# Patient Record
Sex: Female | Born: 1940 | Race: White | Hispanic: No | Smoking: Former smoker
Health system: Southern US, Community
[De-identification: ages and names within clinical notes are randomized; demographics above are authoritative.]

## PROBLEM LIST (undated history)

## (undated) DIAGNOSIS — R7303 Prediabetes: Secondary | ICD-10-CM

## (undated) DIAGNOSIS — J302 Other seasonal allergic rhinitis: Secondary | ICD-10-CM

## (undated) DIAGNOSIS — N189 Chronic kidney disease, unspecified: Secondary | ICD-10-CM

## (undated) DIAGNOSIS — K219 Gastro-esophageal reflux disease without esophagitis: Secondary | ICD-10-CM

## (undated) DIAGNOSIS — E785 Hyperlipidemia, unspecified: Secondary | ICD-10-CM

## (undated) DIAGNOSIS — E559 Vitamin D deficiency, unspecified: Secondary | ICD-10-CM

## (undated) DIAGNOSIS — C801 Malignant (primary) neoplasm, unspecified: Secondary | ICD-10-CM

## (undated) DIAGNOSIS — J45909 Unspecified asthma, uncomplicated: Secondary | ICD-10-CM

## (undated) DIAGNOSIS — M1990 Unspecified osteoarthritis, unspecified site: Secondary | ICD-10-CM

## (undated) DIAGNOSIS — I10 Essential (primary) hypertension: Secondary | ICD-10-CM

## (undated) DIAGNOSIS — H269 Unspecified cataract: Secondary | ICD-10-CM

## (undated) DIAGNOSIS — T7840XA Allergy, unspecified, initial encounter: Secondary | ICD-10-CM

## (undated) DIAGNOSIS — Z87440 Personal history of urinary (tract) infections: Secondary | ICD-10-CM

## (undated) HISTORY — PX: TONSILLECTOMY: SUR1361

## (undated) HISTORY — PX: DILATION AND CURETTAGE OF UTERUS: SHX78

## (undated) HISTORY — DX: Prediabetes: R73.03

## (undated) HISTORY — DX: Allergy, unspecified, initial encounter: T78.40XA

## (undated) HISTORY — DX: Hyperlipidemia, unspecified: E78.5

## (undated) HISTORY — DX: Vitamin D deficiency, unspecified: E55.9

## (undated) HISTORY — DX: Unspecified cataract: H26.9

## (undated) HISTORY — DX: Gastro-esophageal reflux disease without esophagitis: K21.9

---

## 1997-03-28 HISTORY — PX: BACK SURGERY: SHX140

## 1997-05-22 ENCOUNTER — Ambulatory Visit (HOSPITAL_COMMUNITY): Admission: RE | Admit: 1997-05-22 | Discharge: 1997-05-22 | Payer: Self-pay | Admitting: *Deleted

## 1997-10-21 ENCOUNTER — Other Ambulatory Visit: Admission: RE | Admit: 1997-10-21 | Discharge: 1997-10-21 | Payer: Self-pay | Admitting: *Deleted

## 1998-07-08 ENCOUNTER — Ambulatory Visit (HOSPITAL_COMMUNITY): Admission: RE | Admit: 1998-07-08 | Discharge: 1998-07-08 | Payer: Self-pay | Admitting: Internal Medicine

## 1998-09-28 ENCOUNTER — Other Ambulatory Visit: Admission: RE | Admit: 1998-09-28 | Discharge: 1998-09-28 | Payer: Self-pay | Admitting: *Deleted

## 1999-03-12 ENCOUNTER — Encounter (INDEPENDENT_AMBULATORY_CARE_PROVIDER_SITE_OTHER): Payer: Self-pay | Admitting: Specialist

## 1999-03-12 ENCOUNTER — Other Ambulatory Visit: Admission: RE | Admit: 1999-03-12 | Discharge: 1999-03-12 | Payer: Self-pay | Admitting: *Deleted

## 1999-07-27 ENCOUNTER — Ambulatory Visit (HOSPITAL_COMMUNITY): Admission: RE | Admit: 1999-07-27 | Discharge: 1999-07-27 | Payer: Self-pay | Admitting: Internal Medicine

## 1999-07-27 ENCOUNTER — Encounter: Payer: Self-pay | Admitting: Internal Medicine

## 1999-11-11 ENCOUNTER — Other Ambulatory Visit: Admission: RE | Admit: 1999-11-11 | Discharge: 1999-11-11 | Payer: Self-pay | Admitting: *Deleted

## 2000-09-05 ENCOUNTER — Encounter: Payer: Self-pay | Admitting: Internal Medicine

## 2000-09-05 ENCOUNTER — Ambulatory Visit (HOSPITAL_COMMUNITY): Admission: RE | Admit: 2000-09-05 | Discharge: 2000-09-05 | Payer: Self-pay | Admitting: Internal Medicine

## 2000-11-28 ENCOUNTER — Other Ambulatory Visit: Admission: RE | Admit: 2000-11-28 | Discharge: 2000-11-28 | Payer: Self-pay | Admitting: *Deleted

## 2001-09-24 ENCOUNTER — Encounter: Payer: Self-pay | Admitting: *Deleted

## 2001-09-24 ENCOUNTER — Ambulatory Visit (HOSPITAL_COMMUNITY): Admission: RE | Admit: 2001-09-24 | Discharge: 2001-09-24 | Payer: Self-pay | Admitting: *Deleted

## 2002-02-07 ENCOUNTER — Other Ambulatory Visit: Admission: RE | Admit: 2002-02-07 | Discharge: 2002-02-07 | Payer: Self-pay | Admitting: *Deleted

## 2002-03-04 ENCOUNTER — Encounter: Admission: RE | Admit: 2002-03-04 | Discharge: 2002-03-04 | Payer: Self-pay | Admitting: *Deleted

## 2002-03-04 ENCOUNTER — Encounter: Payer: Self-pay | Admitting: *Deleted

## 2002-03-07 ENCOUNTER — Other Ambulatory Visit: Admission: RE | Admit: 2002-03-07 | Discharge: 2002-03-07 | Payer: Self-pay | Admitting: *Deleted

## 2002-10-02 ENCOUNTER — Other Ambulatory Visit: Admission: RE | Admit: 2002-10-02 | Discharge: 2002-10-02 | Payer: Self-pay | Admitting: *Deleted

## 2003-02-11 ENCOUNTER — Encounter: Admission: RE | Admit: 2003-02-11 | Discharge: 2003-02-11 | Payer: Self-pay | Admitting: *Deleted

## 2003-04-09 ENCOUNTER — Other Ambulatory Visit: Admission: RE | Admit: 2003-04-09 | Discharge: 2003-04-09 | Payer: Self-pay | Admitting: *Deleted

## 2003-05-21 ENCOUNTER — Ambulatory Visit (HOSPITAL_BASED_OUTPATIENT_CLINIC_OR_DEPARTMENT_OTHER): Admission: RE | Admit: 2003-05-21 | Discharge: 2003-05-21 | Payer: Self-pay | Admitting: Orthopedic Surgery

## 2003-05-21 ENCOUNTER — Ambulatory Visit (HOSPITAL_COMMUNITY): Admission: RE | Admit: 2003-05-21 | Discharge: 2003-05-21 | Payer: Self-pay | Admitting: Orthopedic Surgery

## 2004-04-05 ENCOUNTER — Other Ambulatory Visit: Admission: RE | Admit: 2004-04-05 | Discharge: 2004-04-05 | Payer: Self-pay | Admitting: *Deleted

## 2004-04-08 ENCOUNTER — Encounter: Admission: RE | Admit: 2004-04-08 | Discharge: 2004-04-08 | Payer: Self-pay | Admitting: *Deleted

## 2004-05-18 ENCOUNTER — Ambulatory Visit: Payer: Self-pay | Admitting: Gastroenterology

## 2005-05-26 ENCOUNTER — Encounter: Admission: RE | Admit: 2005-05-26 | Discharge: 2005-05-26 | Payer: Self-pay | Admitting: *Deleted

## 2005-07-06 ENCOUNTER — Other Ambulatory Visit: Admission: RE | Admit: 2005-07-06 | Discharge: 2005-07-06 | Payer: Self-pay | Admitting: *Deleted

## 2006-06-30 ENCOUNTER — Encounter: Admission: RE | Admit: 2006-06-30 | Discharge: 2006-06-30 | Payer: Self-pay | Admitting: Internal Medicine

## 2006-07-27 ENCOUNTER — Other Ambulatory Visit: Admission: RE | Admit: 2006-07-27 | Discharge: 2006-07-27 | Payer: Self-pay | Admitting: *Deleted

## 2006-08-23 ENCOUNTER — Ambulatory Visit: Payer: Self-pay | Admitting: Gastroenterology

## 2006-09-04 ENCOUNTER — Ambulatory Visit: Payer: Self-pay | Admitting: Gastroenterology

## 2006-09-04 ENCOUNTER — Encounter: Payer: Self-pay | Admitting: Gastroenterology

## 2006-09-08 ENCOUNTER — Ambulatory Visit: Payer: Self-pay | Admitting: Cardiovascular Disease

## 2006-09-11 ENCOUNTER — Ambulatory Visit: Payer: Self-pay

## 2006-10-06 ENCOUNTER — Encounter (INDEPENDENT_AMBULATORY_CARE_PROVIDER_SITE_OTHER): Payer: Self-pay | Admitting: *Deleted

## 2007-08-29 ENCOUNTER — Encounter: Admission: RE | Admit: 2007-08-29 | Discharge: 2007-08-29 | Payer: Self-pay | Admitting: Internal Medicine

## 2007-12-07 ENCOUNTER — Encounter: Payer: Self-pay | Admitting: Pulmonary Disease

## 2008-01-02 ENCOUNTER — Ambulatory Visit: Payer: Self-pay | Admitting: Pulmonary Disease

## 2008-01-02 DIAGNOSIS — E785 Hyperlipidemia, unspecified: Secondary | ICD-10-CM

## 2008-01-02 DIAGNOSIS — E782 Mixed hyperlipidemia: Secondary | ICD-10-CM | POA: Insufficient documentation

## 2008-01-02 DIAGNOSIS — I10 Essential (primary) hypertension: Secondary | ICD-10-CM

## 2008-01-02 DIAGNOSIS — R9389 Abnormal findings on diagnostic imaging of other specified body structures: Secondary | ICD-10-CM | POA: Insufficient documentation

## 2008-03-28 HISTORY — PX: KNEE ARTHROPLASTY: SHX992

## 2008-06-23 ENCOUNTER — Ambulatory Visit: Payer: Self-pay | Admitting: Pulmonary Disease

## 2008-06-23 LAB — CONVERTED CEMR LAB
Chloride: 102 meq/L (ref 96–112)
Creatinine, Ser: 0.9 mg/dL (ref 0.4–1.2)
Potassium: 3.3 meq/L — ABNORMAL LOW (ref 3.5–5.1)
Sodium: 138 meq/L (ref 135–145)

## 2008-06-26 ENCOUNTER — Ambulatory Visit: Payer: Self-pay | Admitting: Cardiovascular Disease

## 2008-07-23 ENCOUNTER — Ambulatory Visit: Payer: Self-pay | Admitting: Pulmonary Disease

## 2008-11-03 ENCOUNTER — Encounter: Admission: RE | Admit: 2008-11-03 | Discharge: 2008-11-03 | Payer: Self-pay | Admitting: Internal Medicine

## 2009-01-26 ENCOUNTER — Inpatient Hospital Stay (HOSPITAL_COMMUNITY): Admission: RE | Admit: 2009-01-26 | Discharge: 2009-01-30 | Payer: Self-pay | Admitting: Orthopedic Surgery

## 2010-01-07 ENCOUNTER — Encounter: Admission: RE | Admit: 2010-01-07 | Discharge: 2010-01-07 | Payer: Self-pay | Admitting: Internal Medicine

## 2010-03-16 ENCOUNTER — Other Ambulatory Visit
Admission: RE | Admit: 2010-03-16 | Discharge: 2010-03-16 | Payer: Self-pay | Source: Home / Self Care | Admitting: Internal Medicine

## 2010-03-16 LAB — HM PAP SMEAR: HM Pap smear: NORMAL

## 2010-06-30 LAB — CBC
HCT: 23.2 % — ABNORMAL LOW (ref 36.0–46.0)
HCT: 25.5 % — ABNORMAL LOW (ref 36.0–46.0)
HCT: 26.9 % — ABNORMAL LOW (ref 36.0–46.0)
Hemoglobin: 9.4 g/dL — ABNORMAL LOW (ref 12.0–15.0)
MCHC: 35 g/dL (ref 30.0–36.0)
MCHC: 35.2 g/dL (ref 30.0–36.0)
MCV: 90.1 fL (ref 78.0–100.0)
MCV: 90.2 fL (ref 78.0–100.0)
MCV: 90.8 fL (ref 78.0–100.0)
Platelets: 252 10*3/uL (ref 150–400)
RBC: 2.56 MIL/uL — ABNORMAL LOW (ref 3.87–5.11)
RBC: 2.83 MIL/uL — ABNORMAL LOW (ref 3.87–5.11)
RBC: 2.98 MIL/uL — ABNORMAL LOW (ref 3.87–5.11)
RDW: 12.9 % (ref 11.5–15.5)
WBC: 5.8 10*3/uL (ref 4.0–10.5)
WBC: 8 10*3/uL (ref 4.0–10.5)

## 2010-06-30 LAB — BASIC METABOLIC PANEL
BUN: 3 mg/dL — ABNORMAL LOW (ref 6–23)
BUN: 6 mg/dL (ref 6–23)
CO2: 28 mEq/L (ref 19–32)
CO2: 28 mEq/L (ref 19–32)
Calcium: 8.5 mg/dL (ref 8.4–10.5)
Chloride: 100 mEq/L (ref 96–112)
Chloride: 94 mEq/L — ABNORMAL LOW (ref 96–112)
Chloride: 97 mEq/L (ref 96–112)
GFR calc Af Amer: >60 mL/min (ref >60)
GFR calc Af Amer: >60 mL/min (ref >60)
Glucose, Bld: 118 mg/dL — ABNORMAL HIGH (ref 70–99)
Glucose, Bld: 124 mg/dL — ABNORMAL HIGH (ref 70–99)
Potassium: 3.7 mEq/L (ref 3.5–5.1)
Potassium: 3.8 mEq/L (ref 3.5–5.1)
Potassium: 4.1 mEq/L (ref 3.5–5.1)
Potassium: 4.1 mEq/L (ref 3.5–5.1)
Sodium: 128 mEq/L — ABNORMAL LOW (ref 135–145)
Sodium: 134 mEq/L — ABNORMAL LOW (ref 135–145)

## 2010-06-30 LAB — PROTIME-INR
INR: 1.07 (ref 0.00–1.49)
Prothrombin Time: 17.6 seconds — ABNORMAL HIGH (ref 11.6–15.2)

## 2010-07-01 LAB — COMPREHENSIVE METABOLIC PANEL
BUN: 11 mg/dL (ref 6–23)
CO2: 28 mEq/L (ref 19–32)
Calcium: 9.5 mg/dL (ref 8.4–10.5)
Creatinine, Ser: 0.59 mg/dL (ref 0.4–1.2)
GFR calc Af Amer: >60 mL/min (ref >60)
GFR calc non Af Amer: >60 mL/min (ref >60)
Glucose, Bld: 100 mg/dL — ABNORMAL HIGH (ref 70–99)

## 2010-07-01 LAB — URINALYSIS, ROUTINE W REFLEX MICROSCOPIC
Bilirubin Urine: NEGATIVE
Glucose, UA: NEGATIVE mg/dL
Hgb urine dipstick: NEGATIVE
Specific Gravity, Urine: 1.011 (ref 1.005–1.030)
Urobilinogen, UA: 0.2 mg/dL (ref 0.0–1.0)

## 2010-07-01 LAB — TYPE AND SCREEN: ABO/RH(D): O POS

## 2010-07-01 LAB — CBC
HCT: 36.4 % (ref 36.0–46.0)
Hemoglobin: 12.6 g/dL (ref 12.0–15.0)
MCHC: 34.5 g/dL (ref 30.0–36.0)
MCV: 90.4 fL (ref 78.0–100.0)
RBC: 4.03 MIL/uL (ref 3.87–5.11)

## 2010-07-01 LAB — PROTIME-INR: Prothrombin Time: 13.3 seconds (ref 11.6–15.2)

## 2010-07-01 LAB — APTT: aPTT: 26 seconds (ref 24–37)

## 2010-07-09 ENCOUNTER — Other Ambulatory Visit: Payer: Self-pay | Admitting: Dermatology

## 2010-08-10 NOTE — Letter (Signed)
September 08, 2006    Almedia Balls. Fore, M.D.  [phone] [address]  [place], [place] [address]   RE:  ADIA, CRAMMER  MRN:  [id number]  /  DOB:  29-Oct-1940   Dear Dr. Randell Patient,   It was my pleasure to see Barrie Sigmund at the [facility] Office  on September 08, 2006. As you know, she is a delightful, 70 year old woman  who presented here for evaluation of an abnormal ECG. She has been noted  to have endometrial polyps and/or hyperplasia and presented this morning  to your office for a D&C. Her preoperative EKG demonstrated  anterolateral as well as inferior ST segment and T wave changes that  raised concern for ischemia. Subsequently her surgery was cancelled and  she was sent here for evaluation.   Ms. Castronova has no documented cardiac problems. She is asymptomatic and  specifically denies any chest pain, dyspnea, light-headedness, syncope,  palpitations, orthopnea, PND, or edema. She has no history of diabetes.  However, she does have a history of hypertension and dyslipidemia by her  report.   She tells me that she underwent an exercise Cardiolite stress study  greater than 10 years ago and that it was within normal limits.   CURRENT MEDICATIONS:  1. Atenolol 50 mg daily.  2. Hydrochlorothiazide 25 mg daily.  3. Coenzyme Q-10 100 mEq daily.  4. B complex 200 mg daily.  5. Aspirin 81 mg daily.   ALLERGIES:  CODEINE, PERCOCET, and VICODIN.   PAST MEDICAL HISTORY:  Pertinent for the following:  1. Essential hypertension. Good control reported on beta blocker and      thiazide diuretic.  2. Borderline lipids. She tells me that her total cholesterol was 230      but that she has a high HDL. She has taken red yeast rice but did      not tolerate it well.  3. Lumbar back surgery in 1999.  4. Left knee surgery in 1999 and again in 2004.  5. History of D&C in the past, most recently in 2000.   SOCIAL HISTORY:  She is married with 2 children, she lives locally. 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 She has a history of cigarette use but quit smoking in  1997. She does not drink alcohol or caffeinated beverages. She exercises  regularly with water aerobics, walking, and Pilates.   FAMILY HISTORY:  The patient's mother died at age 40 of breast cancer,  her father died at age 31 in an auto accident. There is no history of  coronary artery disease in the family.   REVIEW OF SYSTEMS:  A complete 12-point review of systems was performed.  The only pertinent positive was joint pain from arthritis. All other  systems are negative except as detailed above.   PHYSICAL EXAMINATION:  GENERAL:  The patient is alert and oriented, she  is in no acute distress.  VITAL SIGNS:  Her weight is 142 pounds, blood pressure is 130/80 in the  right arm, 120/80 in the left arm. Heart rate is 56, respiratory rate is  16.  HEENT:  Normal.  NECK:  Normal carotid upstrokes without bruits. Jugular venous pressure  is normal, no thyromegaly or thyroid nodules.  LUNGS:  Clear to auscultation bilaterally .  HEART:  Regular rate and rhythm without murmurs or gallops. The apex is  discreet and nondisplaced. There is no right ventricular  heave or lift.  BACK:  There is no CVA tenderness.  ABDOMEN:  Soft, nontender, no organomegaly, no bruits, no masses.  EXTREMITIES:  Femoral pulses are 2+ without bruits. Pedal pulses are 2+  and equal. There is no clubbing, cyanosis or edema.  SKIN:  Warm and dry and without rash.  NEUROLOGIC:  Cranial nerves II-XII are intact. Strength is 5/5 and equal  in the arms and legs bilaterally.   EKG demonstrates normal sinus rhythm with anterolateral and inferior T  wave inversions. Cannot rule out ischemia. Compared to EKG from June 27, 2006, the anterolateral T wave change is new; however, compared with an  EKG from May 03, 2005, the changes are similar.   ASSESSMENT:  Ms. Curlin is a 70 year old woman with  an abnormal EKG. Her  ST changes certainly could be consistent with ischemic heart disease  although it is reassuring to see that they are unchanged from her  electrocardiogram dating back to 2007. Her lack of symptoms is also  reassuring especially at her level of fitness and activity. I think in  the setting of significant changes on her ECG that we should proceed  with an exercise Myoview stress study to rule out silent ischemia. She  does have some risk factors for coronary artery disease which include  her underlying hypertension and dyslipidemia as well as her history of  tobacco use and her age. Pending the result of her stress study, she  should be a low-risk candidate for her upcoming D&C unless there is a  significant abnormality seen.   Regardless of the result of the stress study, I would favor treating her  with low-dose aspirin after her surgery is over. I would defer her lipid  management to Dr. Oneta Rack who follows her for primary care. Her blood  pressure seems to be under very good control at this point.   Dr. Randell Patient, thanks again for allowing me to see Ms. Heyne. I will be in  contact with your office as soon as her stress study is completed so  that she can move forward with her surgery. Please free to call at any  time with questions.    Sincerely,      Veverly Fells. Excell Seltzer, MD  Electronically Signed    MDC/MedQ  DD: 09/11/2006  DT: 09/11/2006  Job #: [id number]   CC:    Lucky Cowboy, M.D.

## 2010-08-13 NOTE — Op Note (Signed)
NAME:  Michaela Kelley, Michaela Kelley                           ACCOUNT NO.:  [id number]   MEDICAL RECORD NO.:  [id number]                   PATIENT TYPE:  AMB   LOCATION:  [facility]                                  FACILITY:  [facility]   PHYSICIAN:  Loreta Ave, M.D.              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  Reactive synovitis and loose bodies.   PROCEDURE:  Left knee exam under anesthesia, arthroscopy, partial medial and  lateral meniscectomies, chondroplasty of all three compartments with removal  of two large osteochondral loose bodies and numerous smaller chondral loose  bodies, partial synovectomy.   SURGEON:  Loreta Ave, M.D.   ASSISTANT:  Arlys John D. Petrarca, P.A.-C.   ANESTHESIA:  Knee block with general.   SPECIMENS:  None.   CULTURES:  None.   COMPLICATIONS:  None.   DRESSINGS:  Soft compressive.   PROCEDURE:  The patient was brought to the operating room and after adequate  anesthesia had been obtained, the tourniquet and leg holder were applied to  the left knee.  The knee was examined with good motion and stability, slight  varus alignment, stable ligaments, but crepitus in all three compartments.  After the tourniquet and leg holder were applied, the leg was prepped and  draped in the usual sterile fashion.  Three portals were created, one  superolateral and one each medial and lateral parapatellar.  Inflow catheter  introduced, the knee distended, arthroscope introduced, the knee inspected.  Extensive grade 3 changes of the patellofemoral joint debrided.   Some  lateral tracking not excessive.  Focal grade 4 lesion on the trochlea,  medial aspect, debrided, and loose flaps removed.  Two large 7-8 mm  osteochondral loose bodies removed from the front of the notch.  Cruciate  ligaments had some attrition but intact.  Medial compartment exposed, bone  over more than half the compartment, what was left of the medial meniscus  had extensive complex torn fragment throughout.  Saucerized out removing  most of the meniscus except for a little of the posterior third.  Everything  removed was torn and nonfunctional.  The lateral compartment had extensive  grade 3 and some focal grade 4 changes mostly on the plateau.  Frayed flap  tears all the way around the lateral meniscus, saucerized out, tapered  smooth, retaining about half the meniscus all the way around at the  completion.  Hypertrophic synovitis removed.  At completion, all recesses  examined, all loose bodies removed.  Very poor prognosis based on the  degree  of degenerative changes found.  The instruments and fluid were  removed.  The portals of the knee were injected with Marcaine.  The portals  were closed with 4-0 nylon.  Sterile compressive dressing applied.  Anesthesia reversed.  Brought to the recovery room.  Tolerated the surgery  well without complications.                                               Loreta Ave, M.D.    DFM/MEDQ  D:  05/21/2003  T:  05/21/2003  Job:  [id number]

## 2011-02-02 ENCOUNTER — Encounter (HOSPITAL_COMMUNITY): Payer: Self-pay | Admitting: Pharmacy Technician

## 2011-02-03 ENCOUNTER — Encounter (HOSPITAL_COMMUNITY)
Admission: RE | Admit: 2011-02-03 | Discharge: 2011-02-03 | Disposition: A | Discharge status: Home / Self Care | Payer: Medicare Other | Source: Ambulatory Visit | Attending: Orthopedic Surgery | Admitting: Orthopedic Surgery

## 2011-02-03 ENCOUNTER — Ambulatory Visit (HOSPITAL_COMMUNITY)
Admission: RE | Admit: 2011-02-03 | Discharge: 2011-02-03 | Disposition: A | Discharge status: Home / Self Care | Payer: Medicare Other | Source: Ambulatory Visit | Attending: Anesthesiology | Admitting: Anesthesiology

## 2011-02-03 ENCOUNTER — Encounter (HOSPITAL_COMMUNITY): Payer: Self-pay

## 2011-02-03 DIAGNOSIS — Z01812 Encounter for preprocedural laboratory examination: Secondary | ICD-10-CM | POA: Insufficient documentation

## 2011-02-03 DIAGNOSIS — Z01818 Encounter for other preprocedural examination: Secondary | ICD-10-CM | POA: Insufficient documentation

## 2011-02-03 HISTORY — DX: Personal history of urinary (tract) infections: Z87.440

## 2011-02-03 HISTORY — DX: Other seasonal allergic rhinitis: J30.2

## 2011-02-03 HISTORY — DX: Unspecified osteoarthritis, unspecified site: M19.90

## 2011-02-03 HISTORY — DX: Essential (primary) hypertension: I10

## 2011-02-03 LAB — COMPREHENSIVE METABOLIC PANEL
ALT: 34 U/L (ref 0–35)
Alkaline Phosphatase: 71 U/L (ref 39–117)
BUN: 9 mg/dL (ref 6–23)
Chloride: 94 mEq/L — ABNORMAL LOW (ref 96–112)
GFR calc Af Amer: >90 mL/min (ref >90)
Glucose, Bld: 100 mg/dL — ABNORMAL HIGH (ref 70–99)
Potassium: 3.5 mEq/L (ref 3.5–5.1)
Sodium: 130 mEq/L — ABNORMAL LOW (ref 135–145)
Total Bilirubin: 0.5 mg/dL (ref 0.3–1.2)

## 2011-02-03 LAB — CBC
HCT: 37.5 % (ref 36.0–46.0)
Hemoglobin: 12.9 g/dL (ref 12.0–15.0)
MCH: 30.4 pg (ref 26.0–34.0)
MCV: 88.2 fL (ref 78.0–100.0)
Platelets: 406 10*3/uL — ABNORMAL HIGH (ref 150–400)
RBC: 4.25 MIL/uL (ref 3.87–5.11)
WBC: 6.9 10*3/uL (ref 4.0–10.5)

## 2011-02-03 LAB — SURGICAL PCR SCREEN
MRSA, PCR: NEGATIVE
Staphylococcus aureus: NEGATIVE

## 2011-02-03 LAB — URINALYSIS, ROUTINE W REFLEX MICROSCOPIC
Glucose, UA: NEGATIVE mg/dL
Hgb urine dipstick: NEGATIVE
Ketones, ur: NEGATIVE mg/dL
Leukocytes, UA: NEGATIVE
Protein, ur: NEGATIVE mg/dL
pH: 7.5 (ref 5.0–8.0)

## 2011-02-03 LAB — TYPE AND SCREEN: ABO/RH(D): O POS

## 2011-02-03 LAB — PROTIME-INR: Prothrombin Time: 13.8 seconds (ref 11.6–15.2)

## 2011-02-03 NOTE — Pre-Procedure Instructions (Signed)
20 Michaela Kelley  02/03/2011   Your procedure is scheduled on:  November 15   Report to [facility] at 5:30 AM.  Call this number if you have problems the morning of surgery: [phone]   Remember:   Do not eat food:After Midnight.  Do not drink clear liquids: 4 Hours before arrival.  Take these medicines the morning of surgery with A SIP OF WATER: pain pill, atenolol,    Do not wear jewelry, make-up or nail polish.  Do not wear lotions, powders, or perfumes. You may wear deodorant.  Do not shave 48 hours prior to surgery.  Do not bring valuables to the hospital.  Contacts, dentures or bridgework may not be worn into surgery.  Leave suitcase in the car. After surgery it may be brought to your room.  For patients admitted to the hospital, checkout time is 11:00 AM the day of discharge.   Patients discharged the day of surgery will not be allowed to drive home.  Name and phone number of your driver: NA  Special Instructions: Incentive Spirometry - Practice and bring it with you on the day of surgery. and CHG Shower Use Special Wash: 1/2 bottle night before surgery and 1/2 bottle morning of surgery.   Please read over the following fact sheets that you were given: Pain Booklet, Coughing and Deep Breathing, Blood Transfusion Information and Surgical Site Infection Prevention

## 2011-02-03 NOTE — Progress Notes (Signed)
Requested EKG from Dr. Gerlene Fee office.

## 2011-02-09 NOTE — Consult Note (Signed)
Anesthesia:  Patient is a 70 year old for left shoulder arthroplasty.  Hx of HTN.  Labs/CXR/EKG noted.  Plan to proceed.

## 2011-02-10 ENCOUNTER — Encounter (HOSPITAL_COMMUNITY): Payer: Self-pay | Admitting: *Deleted

## 2011-02-10 ENCOUNTER — Encounter (HOSPITAL_COMMUNITY)
Admission: RE | Disposition: A | Discharge status: Home / Self Care | Payer: Self-pay | Source: Ambulatory Visit | Attending: Orthopedic Surgery

## 2011-02-10 ENCOUNTER — Ambulatory Visit (HOSPITAL_COMMUNITY): Payer: Medicare Other | Admitting: Vascular Surgery

## 2011-02-10 ENCOUNTER — Inpatient Hospital Stay (HOSPITAL_COMMUNITY)
Admission: RE | Admit: 2011-02-10 | Discharge: 2011-02-11 | DRG: 484 | Disposition: A | Discharge status: Home / Self Care | Payer: Medicare Other | Source: Ambulatory Visit | Attending: Orthopedic Surgery | Admitting: Orthopedic Surgery

## 2011-02-10 ENCOUNTER — Encounter (HOSPITAL_COMMUNITY): Payer: Self-pay | Admitting: Vascular Surgery

## 2011-02-10 DIAGNOSIS — I10 Essential (primary) hypertension: Secondary | ICD-10-CM | POA: Diagnosis present

## 2011-02-10 DIAGNOSIS — M19019 Primary osteoarthritis, unspecified shoulder: Principal | ICD-10-CM | POA: Diagnosis present

## 2011-02-10 HISTORY — PX: TOTAL SHOULDER ARTHROPLASTY: SHX126

## 2011-02-10 SURGERY — ARTHROPLASTY, SHOULDER, TOTAL
Anesthesia: Regional | Site: Shoulder | Laterality: Left | Wound class: Clean

## 2011-02-10 MED ORDER — PHENYLEPHRINE HCL 10 MG/ML IJ SOLN
INTRAMUSCULAR | Status: DC | PRN
  Administered 2011-02-10 (×3): 80 ug via INTRAVENOUS
  Administered 2011-02-10 (×2): 160 ug via INTRAVENOUS

## 2011-02-10 MED ORDER — ONDANSETRON HCL 4 MG/2ML IJ SOLN: 4.0000 mg | Freq: Once | INTRAMUSCULAR | Status: DC | PRN

## 2011-02-10 MED ORDER — EPHEDRINE SULFATE 50 MG/ML IJ SOLN
INTRAMUSCULAR | Status: DC | PRN
  Administered 2011-02-10 (×2): 10 mg via INTRAVENOUS

## 2011-02-10 MED ORDER — BISACODYL 5 MG PO TBEC: 10.0000 mg | DELAYED_RELEASE_TABLET | Freq: Every day | ORAL | Status: DC | PRN

## 2011-02-10 MED ORDER — HYDROCHLOROTHIAZIDE 25 MG PO TABS
25.0000 mg | ORAL_TABLET | Freq: Every day | ORAL | Status: DC
  Administered 2011-02-10 – 2011-02-11 (×2): 25 mg via ORAL
  Filled 2011-02-10 (×2): qty 1

## 2011-02-10 MED ORDER — POLYETHYLENE GLYCOL 3350 17 G PO PACK
17.0000 g | PACK | Freq: Every day | ORAL | Status: DC | PRN
  Filled 2011-02-10: qty 1

## 2011-02-10 MED ORDER — HYDROMORPHONE HCL PF 1 MG/ML IJ SOLN: 0.2500 mg | INTRAMUSCULAR | Status: DC | PRN

## 2011-02-10 MED ORDER — ACETAMINOPHEN 325 MG PO TABS
650.0000 mg | ORAL_TABLET | Freq: Four times a day (QID) | ORAL | Status: DC | PRN
  Administered 2011-02-10: 650 mg via ORAL
  Filled 2011-02-10: qty 2

## 2011-02-10 MED ORDER — VITAMIN D2 50 MCG (2000 UT) PO TABS: 1.0000 | ORAL_TABLET | Freq: Every day | ORAL | Status: DC

## 2011-02-10 MED ORDER — ATENOLOL 50 MG PO TABS
50.0000 mg | ORAL_TABLET | Freq: Every day | ORAL | Status: DC
  Administered 2011-02-11: 50 mg via ORAL
  Filled 2011-02-10 (×2): qty 1

## 2011-02-10 MED ORDER — LACTATED RINGERS IV SOLN
INTRAVENOUS | Status: DC
  Administered 2011-02-10: 14:00:00 via INTRAVENOUS

## 2011-02-10 MED ORDER — ONDANSETRON HCL 4 MG PO TABS
4.0000 mg | ORAL_TABLET | Freq: Four times a day (QID) | ORAL | Status: DC | PRN
  Administered 2011-02-10: 4 mg via ORAL

## 2011-02-10 MED ORDER — ACETAMINOPHEN 650 MG RE SUPP: 650.0000 mg | Freq: Four times a day (QID) | RECTAL | Status: DC | PRN

## 2011-02-10 MED ORDER — ZOLPIDEM TARTRATE 5 MG PO TABS: 5.0000 mg | ORAL_TABLET | Freq: Every evening | ORAL | Status: DC | PRN

## 2011-02-10 MED ORDER — BUPIVACAINE-EPINEPHRINE PF 0.5-1:200000 % IJ SOLN
INTRAMUSCULAR | Status: DC | PRN
  Administered 2011-02-10: 30 mL

## 2011-02-10 MED ORDER — LACTATED RINGERS IV SOLN
INTRAVENOUS | Status: DC | PRN
  Administered 2011-02-10 (×2): via INTRAVENOUS

## 2011-02-10 MED ORDER — MAGNESIUM HYDROXIDE 400 MG/5ML PO SUSP: 30.0000 mL | Freq: Two times a day (BID) | ORAL | Status: DC | PRN

## 2011-02-10 MED ORDER — ONDANSETRON HCL 4 MG/2ML IJ SOLN
INTRAMUSCULAR | Status: DC | PRN
  Administered 2011-02-10: 4 mg via INTRAVENOUS

## 2011-02-10 MED ORDER — ASPIRIN EC 81 MG PO TBEC
81.0000 mg | DELAYED_RELEASE_TABLET | Freq: Every day | ORAL | Status: DC
  Administered 2011-02-10 – 2011-02-11 (×2): 81 mg via ORAL
  Filled 2011-02-10 (×2): qty 1

## 2011-02-10 MED ORDER — KETOROLAC TROMETHAMINE 15 MG/ML IJ SOLN
15.0000 mg | Freq: Four times a day (QID) | INTRAMUSCULAR | Status: DC
  Administered 2011-02-10 – 2011-02-11 (×4): 15 mg via INTRAVENOUS
  Filled 2011-02-10 (×9): qty 1

## 2011-02-10 MED ORDER — METHOCARBAMOL 100 MG/ML IJ SOLN
500.0000 mg | Freq: Four times a day (QID) | INTRAVENOUS | Status: DC | PRN
  Filled 2011-02-10: qty 5

## 2011-02-10 MED ORDER — ONDANSETRON HCL 4 MG/2ML IJ SOLN: 4.0000 mg | Freq: Four times a day (QID) | INTRAMUSCULAR | Status: DC | PRN

## 2011-02-10 MED ORDER — HYDROMORPHONE HCL PF 1 MG/ML IJ SOLN: 0.5000 mg | INTRAMUSCULAR | Status: DC | PRN

## 2011-02-10 MED ORDER — ROCURONIUM BROMIDE 100 MG/10ML IV SOLN
INTRAVENOUS | Status: DC | PRN
  Administered 2011-02-10 (×3): 80 mg via INTRAVENOUS
  Administered 2011-02-10: 40 mg via INTRAVENOUS
  Administered 2011-02-10: 80 mg via INTRAVENOUS

## 2011-02-10 MED ORDER — BISACODYL 10 MG RE SUPP: 10.0000 mg | Freq: Every day | RECTAL | Status: DC | PRN

## 2011-02-10 MED ORDER — DEXAMETHASONE SODIUM PHOSPHATE 4 MG/ML IJ SOLN
INTRAMUSCULAR | Status: DC | PRN
  Administered 2011-02-10: 4 mg via INTRAVENOUS

## 2011-02-10 MED ORDER — VITAMIN D3 25 MCG (1000 UNIT) PO TABS
1000.0000 [IU] | ORAL_TABLET | Freq: Every day | ORAL | Status: DC
  Administered 2011-02-10 – 2011-02-11 (×2): 1000 [IU] via ORAL
  Filled 2011-02-10 (×4): qty 1

## 2011-02-10 MED ORDER — FENTANYL CITRATE 0.05 MG/ML IJ SOLN
INTRAMUSCULAR | Status: DC | PRN
  Administered 2011-02-10: 100 ug via INTRAVENOUS
  Administered 2011-02-10: 150 ug via INTRAVENOUS

## 2011-02-10 MED ORDER — METOCLOPRAMIDE HCL 5 MG/ML IJ SOLN
5.0000 mg | Freq: Three times a day (TID) | INTRAMUSCULAR | Status: DC | PRN
  Filled 2011-02-10: qty 2

## 2011-02-10 MED ORDER — METOCLOPRAMIDE HCL 5 MG PO TABS
5.0000 mg | ORAL_TABLET | Freq: Three times a day (TID) | ORAL | Status: DC | PRN
  Administered 2011-02-10: 10 mg via ORAL
  Filled 2011-02-10: qty 2

## 2011-02-10 MED ORDER — GLYCOPYRROLATE 0.2 MG/ML IJ SOLN
INTRAMUSCULAR | Status: DC | PRN
  Administered 2011-02-10: .2 mg via INTRAVENOUS
  Administered 2011-02-10: .4 mg via INTRAVENOUS

## 2011-02-10 MED ORDER — DOCUSATE SODIUM 100 MG PO CAPS
100.0000 mg | ORAL_CAPSULE | Freq: Two times a day (BID) | ORAL | Status: DC
  Administered 2011-02-10 – 2011-02-11 (×3): 100 mg via ORAL
  Filled 2011-02-10 (×4): qty 1

## 2011-02-10 MED ORDER — FLEET ENEMA 7-19 GM/118ML RE ENEM: 1.0000 | ENEMA | Freq: Every day | RECTAL | Status: DC | PRN

## 2011-02-10 MED ORDER — MIDAZOLAM HCL 5 MG/5ML IJ SOLN
INTRAMUSCULAR | Status: DC | PRN
  Administered 2011-02-10: 2 mg via INTRAVENOUS

## 2011-02-10 MED ORDER — LORATADINE 10 MG PO TABS
10.0000 mg | ORAL_TABLET | Freq: Every day | ORAL | Status: DC
  Filled 2011-02-10 (×3): qty 1

## 2011-02-10 MED ORDER — CEFAZOLIN SODIUM 1-5 GM-% IV SOLN
1.0000 g | Freq: Four times a day (QID) | INTRAVENOUS | Status: AC
  Administered 2011-02-10 – 2011-02-11 (×3): 1 g via INTRAVENOUS
  Filled 2011-02-10 (×3): qty 50

## 2011-02-10 MED ORDER — PROPOFOL 10 MG/ML IV EMUL
INTRAVENOUS | Status: DC | PRN
  Administered 2011-02-10: 150 mg via INTRAVENOUS

## 2011-02-10 MED ORDER — METHOCARBAMOL 500 MG PO TABS: 500.0000 mg | ORAL_TABLET | Freq: Four times a day (QID) | ORAL | Status: DC | PRN

## 2011-02-10 MED ORDER — CEFAZOLIN SODIUM 1-5 GM-% IV SOLN
1.0000 g | Freq: Once | INTRAVENOUS | Status: AC
  Administered 2011-02-10: 2 g via INTRAVENOUS
  Filled 2011-02-10: qty 50

## 2011-02-10 MED ORDER — MEPERIDINE HCL 25 MG/ML IJ SOLN: 6.2500 mg | INTRAMUSCULAR | Status: DC | PRN

## 2011-02-10 MED ORDER — MENTHOL 3 MG MT LOZG: 1.0000 | LOZENGE | OROMUCOSAL | Status: DC | PRN

## 2011-02-10 MED ORDER — MORPHINE SULFATE 2 MG/ML IJ SOLN: 0.0500 mg/kg | INTRAMUSCULAR | Status: DC | PRN

## 2011-02-10 MED ORDER — SODIUM CHLORIDE 0.9 % IR SOLN
Status: DC | PRN
  Administered 2011-02-10: 1000 mL

## 2011-02-10 MED ORDER — DIPHENHYDRAMINE HCL 12.5 MG/5ML PO ELIX
12.5000 mg | ORAL_SOLUTION | ORAL | Status: DC | PRN
  Filled 2011-02-10: qty 10

## 2011-02-10 MED ORDER — PHENOL 1.4 % MT LIQD
1.0000 | OROMUCOSAL | Status: DC | PRN
  Filled 2011-02-10: qty 177

## 2011-02-10 MED ORDER — HYDROMORPHONE HCL 2 MG PO TABS
2.0000 mg | ORAL_TABLET | ORAL | Status: DC | PRN
  Administered 2011-02-11: 2 mg via ORAL
  Filled 2011-02-10: qty 2

## 2011-02-10 MED ORDER — NEOSTIGMINE METHYLSULFATE 1 MG/ML IJ SOLN
INTRAMUSCULAR | Status: DC | PRN
  Administered 2011-02-10: 3 mg via INTRAVENOUS

## 2011-02-10 SURGICAL SUPPLY — 55 items
BLADE SAW SGTL 83.5X18.5 (BLADE) ×2 IMPLANT
CLOSURE STERI STRIP 1/2 X4 (GAUZE/BANDAGES/DRESSINGS) ×2 IMPLANT
CLOTH BEACON ORANGE TIMEOUT ST (SAFETY) ×2 IMPLANT
COVER SURGICAL LIGHT HANDLE (MISCELLANEOUS) ×4 IMPLANT
DRAPE INCISE IOBAN 66X45 STRL (DRAPES) ×2 IMPLANT
DRAPE SURG 17X11 SM STRL (DRAPES) ×2 IMPLANT
DRAPE SURG 17X23 STRL (DRAPES) ×2 IMPLANT
DRAPE U-SHAPE 47X51 STRL (DRAPES) ×2 IMPLANT
DRILL BIT 7/64X5 (BIT) IMPLANT
DRSG MEPILEX BORDER 4X8 (GAUZE/BANDAGES/DRESSINGS) ×2 IMPLANT
DURAPREP 26ML APPLICATOR (WOUND CARE) ×2 IMPLANT
ELECT CAUTERY BLADE 6.4 (BLADE) ×2 IMPLANT
ELECT REM PT RETURN 9FT ADLT (ELECTROSURGICAL) ×2
ELECTRODE REM PT RTRN 9FT ADLT (ELECTROSURGICAL) ×1 IMPLANT
FACESHIELD LNG OPTICON STERILE (SAFETY) ×6 IMPLANT
GLOVE BIO SURGEON STRL SZ7.5 (GLOVE) ×2 IMPLANT
GLOVE BIO SURGEON STRL SZ8 (GLOVE) ×2 IMPLANT
GLOVE BIOGEL PI IND STRL 8.5 (GLOVE) ×1 IMPLANT
GLOVE BIOGEL PI INDICATOR 8.5 (GLOVE) ×1
GLOVE ECLIPSE 8.5 STRL (GLOVE) ×2 IMPLANT
GLOVE EUDERMIC 7 POWDERFREE (GLOVE) ×2 IMPLANT
GLOVE SS BIOGEL STRL SZ 7.5 (GLOVE) ×2 IMPLANT
GLOVE SUPERSENSE BIOGEL SZ 7.5 (GLOVE) ×2
GOWN STRL NON-REIN LRG LVL3 (GOWN DISPOSABLE) ×2 IMPLANT
KIT BASIN OR (CUSTOM PROCEDURE TRAY) ×2 IMPLANT
KIT ROOM TURNOVER OR (KITS) ×2 IMPLANT
MANIFOLD NEPTUNE II (INSTRUMENTS) ×2 IMPLANT
NDL SUT 6 .5 CRC .975X.05 MAYO (NEEDLE) ×1 IMPLANT
NEEDLE HYPO 25GX1X1/2 BEV (NEEDLE) IMPLANT
NEEDLE MAYO TAPER (NEEDLE) ×1
NS IRRIG 1000ML POUR BTL (IV SOLUTION) ×2 IMPLANT
PACK SHOULDER (CUSTOM PROCEDURE TRAY) ×2 IMPLANT
PAD ARMBOARD 7.5X6 YLW CONV (MISCELLANEOUS) ×2 IMPLANT
PASSER SUT SWANSON 36MM LOOP (INSTRUMENTS) IMPLANT
SLING ARM FOAM STRAP MED (SOFTGOODS) ×2 IMPLANT
SLING ARM IMMOBILIZER LRG (SOFTGOODS) IMPLANT
SMARTMIX MINI TOWER (MISCELLANEOUS) ×2
SPONGE LAP 18X18 X RAY DECT (DISPOSABLE) IMPLANT
SPONGE LAP 4X18 X RAY DECT (DISPOSABLE) IMPLANT
STRIP CLOSURE SKIN 1/2X4 (GAUZE/BANDAGES/DRESSINGS) ×2 IMPLANT
SUCTION FRAZIER TIP 10 FR DISP (SUCTIONS) ×2 IMPLANT
SUT BONE WAX W31G (SUTURE) IMPLANT
SUT FIBERWIRE #2 38 T-5 BLUE (SUTURE) ×4
SUT MNCRL AB 3-0 PS2 18 (SUTURE) ×2 IMPLANT
SUT VIC AB 1 CT1 27 (SUTURE) ×3
SUT VIC AB 1 CT1 27XBRD ANBCTR (SUTURE) ×3 IMPLANT
SUT VIC AB 2-0 CT1 27 (SUTURE) ×2
SUT VIC AB 2-0 CT1 TAPERPNT 27 (SUTURE) ×2 IMPLANT
SUTURE FIBERWR #2 38 T-5 BLUE (SUTURE) ×2 IMPLANT
SYR 30ML SLIP (SYRINGE) ×2 IMPLANT
SYR CONTROL 10ML LL (SYRINGE) IMPLANT
TOWEL OR 17X24 6PK STRL BLUE (TOWEL DISPOSABLE) ×2 IMPLANT
TOWEL OR 17X26 10 PK STRL BLUE (TOWEL DISPOSABLE) ×2 IMPLANT
TOWER SMARTMIX MINI (MISCELLANEOUS) ×1 IMPLANT
WATER STERILE IRR 1000ML POUR (IV SOLUTION) ×2 IMPLANT

## 2011-02-10 NOTE — Op Note (Signed)
NAME:  Michaela Kelley, Michaela Kelley                 ACCOUNT NO.:  [id number]  MEDICAL RECORD NO.:  [id number]  LOCATION:  [facility]                         FACILITY:  [facility]  PHYSICIAN:  Vania Rea. Aladdin Kollmann, M.D.  DATE OF BIRTH:  1940/07/05  DATE OF PROCEDURE:  02/10/2011 DATE OF DISCHARGE:                              OPERATIVE REPORT   PREOPERATIVE DIAGNOSIS:  End-stage left shoulder osteoarthritis.  POSTOPERATIVE DIAGNOSIS:  End-stage left shoulder osteoarthritis.  PROCEDURE:  Left total shoulder arthroplasty utilizing a press-fit size 14 DePuy global stem, a cemented pegged 44 glenoid and a 44 x 18 eccentric humeral head.  SURGEON:  Vania Rea. Amiel Mccaffrey, MD  ASSISTANT:  Lucita Lora. Shuford, PA-C  ANESTHESIA:  General endotracheal as well as an interscalene block.  BLOOD LOSS:  250 mL.  DRAINS:  None.  HISTORY:  Michaela Kelley is a 70 year old female who has had chronic and progressively increasing left shoulder pain with radiographic evidence for end-stage arthrosis.  Due to her ongoing pain and functional limitations, she is brought to the operating room at this time for planned left shoulder arthroscopy as described below.  Preoperatively counseled Michaela Kelley on treatment options as well as risks versus benefits thereof.  Possible surgical complications were reviewed including potential for bleeding, infection, neurovascular injury, persistent pain, loss of motion, anesthetic complication, failure of the implant, and possible need for additional surgery.  She understands and accepts and agrees to planned procedure.  PROCEDURE IN DETAIL:  After undergoing routine preop evaluation, the patient received prophylactic antibiotics.  An interscalene block was established in the holding area by the Anesthesia Department.  Placed supine on the op table and underwent smooth induction of a general endotracheal anesthesia.  Placed into beach-chair position and appropriately padded and protected.  Left shoulder was  then sterilely prepped and draped in standard fashion.  Time-out was called.  An anterior approach to the left shoulder was made through a deltopectoral interval with an incision approximately 10 cm in length.  Skin flaps were elevated, and electrocautery was used to obtain hemostasis.  There were a number of large subcutaneous varicosities that we ligated and cauterized.  Deltopectoral interval was then identified, developed bluntly, and self-retaining retractors were placed and the upper centimeter of the pec major tendon was tenotomized distally.  The conjoined tendon was then mobilized and retracted medially and adhesions in the subacromial and subdeltoid bursal region were also divided. Ground retractor was then placed.  She is going to have a very large effusion with severe synovitis of the glenohumeral joint which could be seen protruding through the capsular tissues.  We unroofed the bicipital groove, tenotomized the biceps tendon for later tenodesis.  The rotator interval was then split to the base of the coracoid and then the subscapularis was tenotomized leaving a distal tuft of the distal insertion approximately 2 cm, and the free margin was then tagged with a series of grasping #2 FiberWire sutures.  We mobilized the subscapularis and then retracted it medially.  At this point, we then delivered the humeral head through the wound.  We performed a capsular release inferiorly and allowing complete delivery of the humeral head and protect the rotator cuff  superiorly and posteriorly.  We then used the extramedullary guide to outline our proposed humeral head resection. This was then performed with an oscillating saw.  The humeral head segment was then removed and measured between 44 and 48.  At this point, we then performed hand reaming of the humeral canal up to size 14.  We then used the cookie cutter broach to outline appropriate broaching and then broached up to size 14 and  maintaining approximately 30 degrees retroversion which limited the native retroversion.  After final broaching, we then placed a metal cap over the cut surface of the proximal humerus and then performed an exposure of the glenoid with Fukuda retractor posteriorly and pitchfork anteriorly and then performed a resection of the labral tissue circumferentially including the residual root of the biceps.  The glenoid was markedly denuded of cartilage and degenerated.  Measured with 44 showing the best sizing. The center of the glenoid was then identified, and we reamed the glenoid to a smooth subchondral surface using the 44 reamer.  The central PEG hole was then drilled and the peripheral PEG holes were placed.  It was irrigated and we placed a trial 44 glenoid which showed excellent fit. At this point, the cement was then mixed and introduced into the peripheral PEG holes and the final 44 pegged glenoid was inserted and tamped into place with good fit.  The cement was allowed to harden.  We then removed the retractors from the glenoid, then re-exposed the proximal humerus where the size 14 stem was then introduced into the humeral canal and we bone grafted proximally in the metaphyseal region and performed a final impaction with excellent fit and maintaining again approximately 30 degrees of retroversion.  We then performed sizing was of the humeral head and ultimately the 44 x 18 eccentric showed the best coverage of the proximal humerus.  Excellent soft tissue balance and approximately 50% posterior translation of the humeral head and the glenoid.  Final implant was then impacted after the [facility] taper was meticulously cleaned.  Final reduction was performed.  I then performed a repair of the subscapularis seated with the #2 FiberWires allowing excellent reapposition and confirmed with good excursion of the subscapularis.  The rotator interval was also closed with a series  of figure-of-eight # 2 Fiber Wires allowing excellent reapposition of the subscap and supraspinatus laterally.  We then performed a biceps tendon tenodesis in the bicipital groove with #2 FiberWires.  The wound was then irrigated.  Hemostasis was obtained.  The deltopectoral interval was then reapproximated with 2-0 Vicryl.  A 2-0 Vicryl was used for the subcu layer and intracuticular 3-0 Monocryl for the skin followed by Steri-Strips.  Dry dressings were applied.  Left arm was then placed into a sling.  The patient was awakened and extubated and taken to the recovery room in stable condition.     Vania Rea. Viva Gallaher, M.D.     KMS/MEDQ  D:  02/10/2011  T:  02/10/2011  Job:  [id number]

## 2011-02-10 NOTE — Anesthesia Procedure Notes (Addendum)
Anesthesia Regional Block:  Interscalene brachial plexus block  Pre-Anesthetic Checklist: ,, timeout performed, Correct Patient, Correct Site, Correct Laterality, Correct Procedure, Correct Position, site marked, Risks and benefits discussed,  Surgical consent,  Pre-op evaluation,  At surgeon's request and post-op pain management  Laterality: Right and Upper  Prep: chloraprep       Needles:  Injection technique: Single-shot  Needle Type: Echogenic Stimulator Needle     Needle Length: 5cm 5 cm Needle Gauge: 22 and 22 G    Additional Needles:  Procedures: ultrasound guided Interscalene brachial plexus block Narrative:  Injection made incrementally with aspirations every 5 mL.  Performed by: Personally  Anesthesiologist: Sheldon Silvan  Supraclavicular block

## 2011-02-10 NOTE — Progress Notes (Signed)
Utilization review completed.  

## 2011-02-10 NOTE — Anesthesia Preprocedure Evaluation (Addendum)
Anesthesia Evaluation  Patient identified by MRN, date of birth, ID band Patient awake    Reviewed: Allergy & Precautions, H&P , NPO status , Patient's Chart, lab work & pertinent test results, reviewed documented beta blocker date and time   Airway Mallampati: I TM Distance: >3 FB Neck ROM: Full    Dental No notable dental hx. (+) Teeth Intact and Dental Advisory Given   Pulmonary    Pulmonary exam normal       Cardiovascular hypertension, Pt. on medications and Pt. on home beta blockers     Neuro/Psych    GI/Hepatic   Endo/Other    Renal/GU      Musculoskeletal   Abdominal   Peds  Hematology   Anesthesia Other Findings   Reproductive/Obstetrics                          Anesthesia Physical Anesthesia Plan  ASA: II  Anesthesia Plan: General   Post-op Pain Management:    Induction: Intravenous  Airway Management Planned: Oral ETT  Additional Equipment:   Intra-op Plan:   Post-operative Plan: Extubation in OR  Informed Consent: I have reviewed the patients History and Physical, chart, labs and discussed the procedure including the risks, benefits and alternatives for the proposed anesthesia with the patient or authorized representative who has indicated his/her understanding and acceptance.   Dental advisory given  Plan Discussed with: CRNA and Surgeon  Anesthesia Plan Comments:         Anesthesia Quick Evaluation

## 2011-02-10 NOTE — Transfer of Care (Signed)
Immediate Anesthesia Transfer of Care Note  Patient: Michaela Kelley  Procedure(s) Performed:  TOTAL SHOULDER ARTHROPLASTY - TOTAL SHOULDER ARTHROPLASTY LEFT SIDE  Patient Location: PACU  Anesthesia Type: General  Level of Consciousness: awake, alert  and oriented  Airway & Oxygen Therapy: Patient Spontanous Breathing and Patient connected to nasal cannula oxygen  Post-op Assessment: Report given to PACU RN, Post -op Vital signs reviewed and stable, Patient moving all extremities and Patient able to stick tongue midline  Post vital signs: stable  Complications: No apparent anesthesia complications

## 2011-02-10 NOTE — Anesthesia Postprocedure Evaluation (Signed)
  Anesthesia Post-op Note  Patient: Michaela Kelley  Procedure(s) Performed:  TOTAL SHOULDER ARTHROPLASTY - TOTAL SHOULDER ARTHROPLASTY LEFT SIDE  Patient Location: PACU  Anesthesia Type: General  Level of Consciousness: awake, alert  and oriented  Airway and Oxygen Therapy: Patient Spontanous Breathing and Patient connected to nasal cannula oxygen  Post-op Pain: none  Post-op Assessment: Post-op Vital signs reviewed, Patient's Cardiovascular Status Stable, Respiratory Function Stable, Patent Airway, No signs of Nausea or vomiting, Adequate PO intake and Pain level controlled  Post-op Vital Signs: Reviewed and stable  Complications: No apparent anesthesia complications

## 2011-02-10 NOTE — H&P (Signed)
  No change from office h and p. Patient examined

## 2011-02-10 NOTE — Preoperative (Signed)
Beta Blockers   Reason not to administer Beta Blockers:Not Applicable 

## 2011-02-10 NOTE — Op Note (Signed)
02/10/2011  9:55 AM  PATIENT:   Michaela Kelley  70 y.o. female  PRE-OPERATIVE DIAGNOSIS:  LEFT SHOULDER OSTEOARTHRITIS  POST-OPERATIVE DIAGNOSIS:  same  PROCEDURE:  L TSA #14 depuy global stem, 44 glenoid 44x18 eccentric head  SURGEON:  Senaida Lange M.D.  ASSISTANTS: Shuford PAC   ANESTHESIA:   GET + ISB  EBL: 250   SPECIMEN:  none  Drains: none   PATIENT DISPOSITION:  PACU - hemodynamically stable.    PLAN OF CARE: Admit to inpatient   Dictation:[id number]

## 2011-02-11 ENCOUNTER — Encounter (HOSPITAL_COMMUNITY): Payer: Self-pay | Admitting: Orthopedic Surgery

## 2011-02-11 MED ORDER — HYDROMORPHONE HCL 2 MG PO TABS: 2.0000 mg | ORAL_TABLET | ORAL | Status: AC | PRN

## 2011-02-11 MED ORDER — METHOCARBAMOL 500 MG PO TABS: 500.0000 mg | ORAL_TABLET | Freq: Three times a day (TID) | ORAL | Status: AC | PRN

## 2011-02-11 NOTE — Discharge Summary (Signed)
Physician Discharge Summary  Patient ID: ELWANDA MOGER MRN: [id number] DOB/AGE: 10-21-1940 70 y.o.  Admit date: 02/10/2011 Discharge date: 02/11/2011  Admission Diagnoses: Left shoulder arthritis  Discharge Diagnoses: same  Discharged Condition: good  Hospital Course: Pt underwent planned procedure with no complications and was stable pod 1 for dc to home.   Consults: none  Operative Procedure: Left Total Shoulder Arthroplasty  Disposition:   Discharge Orders    Future Orders Please Complete By Expires   Change dressing      Scheduling Instructions:   With mepilex     Current Discharge Medication List    START taking these medications   Details  !! HYDROmorphone (DILAUDID) 2 MG tablet Take 1-2 tablets (2-4 mg total) by mouth every 4 (four) hours as needed for pain. Qty: 50 tablet, Refills: 0    methocarbamol (ROBAXIN) 500 MG tablet Take 1 tablet (500 mg total) by mouth 3 (three) times daily as needed. Qty: 30 tablet, Refills: 1     !! - Potential duplicate medications found. Please discuss with provider.    CONTINUE these medications which have NOT CHANGED   Details  aspirin EC 81 MG tablet Take 81 mg by mouth daily.      atenolol (TENORMIN) 50 MG tablet Take 50 mg by mouth daily.      cetirizine (ZYRTEC) 10 MG tablet Take 10 mg by mouth daily as needed.      diphenhydrAMINE (SOMINEX) 25 MG tablet Take 25 mg by mouth at bedtime as needed.      Ergocalciferol (VITAMIN D2) 2000 UNITS TABS Take 1-2 tablets by mouth daily.      fenofibrate micronized (LOFIBRA) 134 MG capsule Take 134 mg by mouth daily.      fish oil-omega-3 fatty acids 1000 MG capsule Take 2 g by mouth daily.      hydrochlorothiazide (HYDRODIURIL) 25 MG tablet Take 25 mg by mouth daily.      !! HYDROmorphone (DILAUDID) 2 MG tablet Take 1-2 mg by mouth daily as needed. For severe pain     MAGNESIUM PO Take 1 tablet by mouth daily.      OVER THE COUNTER MEDICATION Take 1 tablet by mouth daily.  Tumeric (hold while in hospital)    ranitidine (ZANTAC) 150 MG tablet Take 150 mg by mouth daily as needed.      thiamine (VITAMIN B-1) 100 MG tablet Take 100 mg by mouth daily.       !! - Potential duplicate medications found. Please discuss with provider.     Follow-up Information    Call SUPPLE,KEVIN M. (call to make appt to follow up in 10-14  days)    Contact information:   [facility] [address] [place] [address] [phone]          Discharge Instructions: see dc instruction sheet  Signed: [facility] 02/11/2011, 7:47 AM

## 2011-02-11 NOTE — Progress Notes (Signed)
Occupational Therapy Shoulder Evaluation  Patient presents with a medical diagnosis of Left total shoulder arthroplasty. Patients presents with no further acute OT needs secondary to :  All education completed. Pt will have necessary level of assist by caregiver on D/C. Caregiver demonstrates I with assisting pt with PROM and ADLs. Pt will have necessary A for safe d/c home.  Please refer to evaluation form in ghost chart for therapy notes. Recommend pt. progress rehab of shoulder as ordered by MD post follow-up appointment.  02/11/2011  Cipriano Mile OTR/L  Pager [phone] Office [phone]

## 2011-10-19 ENCOUNTER — Other Ambulatory Visit: Payer: Self-pay | Admitting: Internal Medicine

## 2011-10-19 DIAGNOSIS — Z1231 Encounter for screening mammogram for malignant neoplasm of breast: Secondary | ICD-10-CM

## 2011-11-10 ENCOUNTER — Ambulatory Visit
Admission: RE | Admit: 2011-11-10 | Discharge: 2011-11-10 | Disposition: A | Discharge status: Home / Self Care | Payer: Medicare Other | Source: Ambulatory Visit | Attending: Internal Medicine | Admitting: Internal Medicine

## 2011-11-10 DIAGNOSIS — Z1231 Encounter for screening mammogram for malignant neoplasm of breast: Secondary | ICD-10-CM

## 2011-12-15 ENCOUNTER — Encounter (HOSPITAL_COMMUNITY): Payer: Self-pay | Admitting: Pharmacy Technician

## 2011-12-15 NOTE — Pre-Procedure Instructions (Signed)
21 Michaela Kelley   12/15/2011   Your procedure is scheduled on: September 26th, Thursday   Report to [facility] at 5:30 AM.   Call this number if you have problems the morning of surgery: [phone]   Remember:   Do not eat food or drink any liquids:After Midnight Wednesday.   Take these medicines the morning of surgery with A SIP OF WATER: Atenolol, Zantac   Do not wear lotions, powders, or perfumes. You may NOT wear deodorant.  LADIES--Do not shave 48 hours prior to surgery.   Do not bring valuables to the hospital.   Contacts, dentures or bridgework may not be worn into surgery.  Leave suitcase in the car. After surgery it may be brought to your room.  For patients admitted to the hospital, checkout time is 11:00 AM the day of discharge.   Patients discharged the day of surgery will not be allowed to drive home.   Name and phone number of your driver:    Special Instructions: Shower using CHG 2 nights before surgery and the night before surgery.  If you shower the day of surgery use CHG.  Use special wash - you have one bottle of CHG for all showers.  You should use approximately 1/3 of the bottle for each shower.   Please read over the following fact sheets that you were given: Pain Booklet, Coughing and Deep Breathing, MRSA Information and Surgical Site Infection Prevention

## 2011-12-16 ENCOUNTER — Encounter (HOSPITAL_COMMUNITY): Payer: Self-pay

## 2011-12-16 ENCOUNTER — Encounter (HOSPITAL_COMMUNITY)
Admission: RE | Admit: 2011-12-16 | Discharge: 2011-12-16 | Disposition: A | Discharge status: Home / Self Care | Payer: Medicare Other | Source: Ambulatory Visit | Attending: Orthopedic Surgery | Admitting: Orthopedic Surgery

## 2011-12-16 LAB — BASIC METABOLIC PANEL
BUN: 11 mg/dL (ref 6–23)
Calcium: 10 mg/dL (ref 8.4–10.5)
Creatinine, Ser: 0.7 mg/dL (ref 0.50–1.10)
GFR calc Af Amer: >90 mL/min (ref >90)
GFR calc non Af Amer: 86 mL/min — ABNORMAL LOW (ref >90)

## 2011-12-16 LAB — CBC
HCT: 36.7 % (ref 36.0–46.0)
MCHC: 34.1 g/dL (ref 30.0–36.0)
Platelets: 269 10*3/uL (ref 150–400)
RDW: 13 % (ref 11.5–15.5)

## 2011-12-16 LAB — SURGICAL PCR SCREEN
MRSA, PCR: NEGATIVE
Staphylococcus aureus: NEGATIVE

## 2011-12-16 LAB — TYPE AND SCREEN
ABO/RH(D): O POS
Antibody Screen: NEGATIVE

## 2011-12-21 MED ORDER — DEXTROSE 5 % IV SOLN
3.0000 g | INTRAVENOUS | Status: AC
  Administered 2011-12-22: 3 g via INTRAVENOUS
  Filled 2011-12-21: qty 3000

## 2011-12-21 MED ORDER — LACTATED RINGERS IV SOLN: INTRAVENOUS | Status: DC

## 2011-12-21 MED ORDER — CHLORHEXIDINE GLUCONATE 4 % EX LIQD: 60.0000 mL | Freq: Once | CUTANEOUS | Status: DC

## 2011-12-22 ENCOUNTER — Encounter (HOSPITAL_COMMUNITY)
Admission: RE | Disposition: A | Discharge status: Home / Self Care | Payer: Self-pay | Source: Ambulatory Visit | Attending: Orthopedic Surgery

## 2011-12-22 ENCOUNTER — Encounter (HOSPITAL_COMMUNITY): Payer: Self-pay | Admitting: Anesthesiology

## 2011-12-22 ENCOUNTER — Inpatient Hospital Stay (HOSPITAL_COMMUNITY): Payer: Medicare Other | Admitting: Anesthesiology

## 2011-12-22 ENCOUNTER — Inpatient Hospital Stay (HOSPITAL_COMMUNITY)
Admission: RE | Admit: 2011-12-22 | Discharge: 2011-12-23 | DRG: 484 | Disposition: A | Discharge status: Home / Self Care | Payer: Medicare Other | Source: Ambulatory Visit | Attending: Orthopedic Surgery | Admitting: Orthopedic Surgery

## 2011-12-22 ENCOUNTER — Encounter (HOSPITAL_COMMUNITY): Payer: Self-pay | Admitting: *Deleted

## 2011-12-22 DIAGNOSIS — Z96619 Presence of unspecified artificial shoulder joint: Secondary | ICD-10-CM

## 2011-12-22 DIAGNOSIS — Z87891 Personal history of nicotine dependence: Secondary | ICD-10-CM

## 2011-12-22 DIAGNOSIS — I10 Essential (primary) hypertension: Secondary | ICD-10-CM | POA: Diagnosis present

## 2011-12-22 DIAGNOSIS — Z01812 Encounter for preprocedural laboratory examination: Secondary | ICD-10-CM

## 2011-12-22 DIAGNOSIS — Z96659 Presence of unspecified artificial knee joint: Secondary | ICD-10-CM

## 2011-12-22 DIAGNOSIS — Z79899 Other long term (current) drug therapy: Secondary | ICD-10-CM

## 2011-12-22 DIAGNOSIS — M19019 Primary osteoarthritis, unspecified shoulder: Principal | ICD-10-CM | POA: Diagnosis present

## 2011-12-22 HISTORY — PX: TOTAL SHOULDER ARTHROPLASTY: SHX126

## 2011-12-22 SURGERY — ARTHROPLASTY, SHOULDER, TOTAL
Anesthesia: General | Site: Shoulder | Laterality: Right | Wound class: Clean

## 2011-12-22 MED ORDER — KETOROLAC TROMETHAMINE 15 MG/ML IJ SOLN
15.0000 mg | Freq: Four times a day (QID) | INTRAMUSCULAR | Status: DC
  Administered 2011-12-22 – 2011-12-23 (×3): 15 mg via INTRAVENOUS
  Filled 2011-12-22 (×8): qty 1

## 2011-12-22 MED ORDER — ONDANSETRON HCL 4 MG/2ML IJ SOLN
INTRAMUSCULAR | Status: DC | PRN
  Administered 2011-12-22: 4 mg via INTRAVENOUS

## 2011-12-22 MED ORDER — HYDROMORPHONE HCL PF 1 MG/ML IJ SOLN
0.5000 mg | INTRAMUSCULAR | Status: DC | PRN
  Filled 2011-12-22: qty 1

## 2011-12-22 MED ORDER — CEFAZOLIN SODIUM 1-5 GM-% IV SOLN
1.0000 g | Freq: Four times a day (QID) | INTRAVENOUS | Status: AC
  Administered 2011-12-22 – 2011-12-23 (×3): 1 g via INTRAVENOUS
  Filled 2011-12-22 (×3): qty 50

## 2011-12-22 MED ORDER — HYDROMORPHONE HCL PF 1 MG/ML IJ SOLN
0.2500 mg | INTRAMUSCULAR | Status: DC | PRN
  Administered 2011-12-22 (×2): 0.5 mg via INTRAVENOUS

## 2011-12-22 MED ORDER — TEMAZEPAM 15 MG PO CAPS: 15.0000 mg | ORAL_CAPSULE | Freq: Every evening | ORAL | Status: DC | PRN

## 2011-12-22 MED ORDER — ONDANSETRON HCL 4 MG/2ML IJ SOLN: 4.0000 mg | Freq: Four times a day (QID) | INTRAMUSCULAR | Status: DC | PRN

## 2011-12-22 MED ORDER — PROPOFOL 10 MG/ML IV BOLUS
INTRAVENOUS | Status: DC | PRN
  Administered 2011-12-22: 50 mg via INTRAVENOUS
  Administered 2011-12-22: 100 mg via INTRAVENOUS

## 2011-12-22 MED ORDER — BISACODYL 5 MG PO TBEC
5.0000 mg | DELAYED_RELEASE_TABLET | Freq: Every day | ORAL | Status: DC | PRN
  Administered 2011-12-23: 5 mg via ORAL
  Filled 2011-12-22: qty 1

## 2011-12-22 MED ORDER — METHOCARBAMOL 500 MG PO TABS
500.0000 mg | ORAL_TABLET | Freq: Four times a day (QID) | ORAL | Status: DC | PRN
  Filled 2011-12-22: qty 1

## 2011-12-22 MED ORDER — OXYCODONE HCL 5 MG/5ML PO SOLN: 5.0000 mg | Freq: Once | ORAL | Status: DC | PRN

## 2011-12-22 MED ORDER — HYDROMORPHONE HCL 2 MG PO TABS
2.0000 mg | ORAL_TABLET | ORAL | Status: DC | PRN
  Administered 2011-12-22 – 2011-12-23 (×2): 2 mg via ORAL
  Filled 2011-12-22 (×2): qty 1
  Filled 2011-12-22: qty 2

## 2011-12-22 MED ORDER — DIPHENHYDRAMINE HCL 12.5 MG/5ML PO ELIX: 12.5000 mg | ORAL_SOLUTION | ORAL | Status: DC | PRN

## 2011-12-22 MED ORDER — MENTHOL 3 MG MT LOZG: 1.0000 | LOZENGE | OROMUCOSAL | Status: DC | PRN

## 2011-12-22 MED ORDER — DOCUSATE SODIUM 100 MG PO CAPS
100.0000 mg | ORAL_CAPSULE | Freq: Two times a day (BID) | ORAL | Status: DC
  Administered 2011-12-22 – 2011-12-23 (×2): 100 mg via ORAL
  Filled 2011-12-22 (×3): qty 1

## 2011-12-22 MED ORDER — ALPRAZOLAM 0.25 MG PO TABS: 0.2500 mg | ORAL_TABLET | ORAL | Status: DC | PRN

## 2011-12-22 MED ORDER — BUPIVACAINE-EPINEPHRINE PF 0.5-1:200000 % IJ SOLN
INTRAMUSCULAR | Status: DC | PRN
  Administered 2011-12-22: 25 mL

## 2011-12-22 MED ORDER — PHENOL 1.4 % MT LIQD: 1.0000 | OROMUCOSAL | Status: DC | PRN

## 2011-12-22 MED ORDER — SODIUM CHLORIDE 0.9 % IR SOLN
Status: DC | PRN
  Administered 2011-12-22: 1000 mL

## 2011-12-22 MED ORDER — LACTATED RINGERS IV SOLN
INTRAVENOUS | Status: DC
  Administered 2011-12-22: 19:00:00 via INTRAVENOUS

## 2011-12-22 MED ORDER — MIDAZOLAM HCL 5 MG/5ML IJ SOLN
INTRAMUSCULAR | Status: DC | PRN
  Administered 2011-12-22: 2 mg via INTRAVENOUS

## 2011-12-22 MED ORDER — ROCURONIUM BROMIDE 100 MG/10ML IV SOLN
INTRAVENOUS | Status: DC | PRN
  Administered 2011-12-22: 50 mg via INTRAVENOUS

## 2011-12-22 MED ORDER — FENTANYL CITRATE 0.05 MG/ML IJ SOLN
INTRAMUSCULAR | Status: DC | PRN
  Administered 2011-12-22 (×2): 50 ug via INTRAVENOUS

## 2011-12-22 MED ORDER — ONDANSETRON HCL 4 MG PO TABS: 4.0000 mg | ORAL_TABLET | Freq: Four times a day (QID) | ORAL | Status: DC | PRN

## 2011-12-22 MED ORDER — NEOSTIGMINE METHYLSULFATE 1 MG/ML IJ SOLN
INTRAMUSCULAR | Status: DC | PRN
  Administered 2011-12-22: 4 mg via INTRAVENOUS

## 2011-12-22 MED ORDER — ATENOLOL 50 MG PO TABS
50.0000 mg | ORAL_TABLET | Freq: Every day | ORAL | Status: DC
  Administered 2011-12-23: 50 mg via ORAL
  Filled 2011-12-22: qty 1

## 2011-12-22 MED ORDER — METOCLOPRAMIDE HCL 5 MG/ML IJ SOLN: 5.0000 mg | Freq: Three times a day (TID) | INTRAMUSCULAR | Status: DC | PRN

## 2011-12-22 MED ORDER — ONDANSETRON HCL 4 MG/2ML IJ SOLN: 4.0000 mg | Freq: Once | INTRAMUSCULAR | Status: DC | PRN

## 2011-12-22 MED ORDER — OXYCODONE HCL 5 MG PO TABS: 5.0000 mg | ORAL_TABLET | Freq: Once | ORAL | Status: DC | PRN

## 2011-12-22 MED ORDER — METHOCARBAMOL 100 MG/ML IJ SOLN
500.0000 mg | Freq: Four times a day (QID) | INTRAVENOUS | Status: DC | PRN
  Filled 2011-12-22: qty 5

## 2011-12-22 MED ORDER — ASPIRIN EC 81 MG PO TBEC
81.0000 mg | DELAYED_RELEASE_TABLET | Freq: Every day | ORAL | Status: DC
  Administered 2011-12-22 – 2011-12-23 (×2): 81 mg via ORAL
  Filled 2011-12-22 (×2): qty 1

## 2011-12-22 MED ORDER — PHENYLEPHRINE HCL 10 MG/ML IJ SOLN
INTRAMUSCULAR | Status: DC | PRN
  Administered 2011-12-22 (×11): 40 ug via INTRAVENOUS
  Administered 2011-12-22: 120 ug via INTRAVENOUS
  Administered 2011-12-22: 40 ug via INTRAVENOUS
  Administered 2011-12-22: 80 ug via INTRAVENOUS

## 2011-12-22 MED ORDER — ACETAMINOPHEN 10 MG/ML IV SOLN
INTRAVENOUS | Status: AC
  Filled 2011-12-22: qty 100

## 2011-12-22 MED ORDER — GLYCOPYRROLATE 0.2 MG/ML IJ SOLN
INTRAMUSCULAR | Status: DC | PRN
  Administered 2011-12-22: .6 mg via INTRAVENOUS

## 2011-12-22 MED ORDER — ALUM & MAG HYDROXIDE-SIMETH 200-200-20 MG/5ML PO SUSP
30.0000 mL | ORAL | Status: DC | PRN
  Administered 2011-12-22 – 2011-12-23 (×2): 30 mL via ORAL
  Filled 2011-12-22 (×2): qty 30

## 2011-12-22 MED ORDER — METOCLOPRAMIDE HCL 10 MG PO TABS: 5.0000 mg | ORAL_TABLET | Freq: Three times a day (TID) | ORAL | Status: DC | PRN

## 2011-12-22 MED ORDER — LACTATED RINGERS IV SOLN
INTRAVENOUS | Status: DC | PRN
  Administered 2011-12-22 (×2): via INTRAVENOUS

## 2011-12-22 MED ORDER — HYDROMORPHONE HCL PF 1 MG/ML IJ SOLN
INTRAMUSCULAR | Status: AC
  Filled 2011-12-22: qty 2

## 2011-12-22 MED ORDER — LIDOCAINE HCL (CARDIAC) 20 MG/ML IV SOLN
INTRAVENOUS | Status: DC | PRN
  Administered 2011-12-22: 70 mg via INTRAVENOUS

## 2011-12-22 SURGICAL SUPPLY — 63 items
BLADE SAW SGTL 83.5X18.5 (BLADE) ×2 IMPLANT
CEMENT BONE DEPUY (Cement) ×2 IMPLANT
CLOTH BEACON ORANGE TIMEOUT ST (SAFETY) ×2 IMPLANT
CLSR STERI-STRIP ANTIMIC 1/2X4 (GAUZE/BANDAGES/DRESSINGS) ×2 IMPLANT
COVER SURGICAL LIGHT HANDLE (MISCELLANEOUS) ×2 IMPLANT
DRAPE INCISE IOBAN 66X45 STRL (DRAPES) ×2 IMPLANT
DRAPE SURG 17X11 SM STRL (DRAPES) ×2 IMPLANT
DRAPE SURG 17X23 STRL (DRAPES) ×2 IMPLANT
DRAPE U-SHAPE 47X51 STRL (DRAPES) ×2 IMPLANT
DRILL BIT 7/64X5 (BIT) IMPLANT
DRSG AQUACEL AG ADV 3.5X10 (GAUZE/BANDAGES/DRESSINGS) IMPLANT
DRSG MEPILEX BORDER 4X8 (GAUZE/BANDAGES/DRESSINGS) IMPLANT
DURAPREP 26ML APPLICATOR (WOUND CARE) ×2 IMPLANT
ELECT BLADE 4.0 EZ CLEAN MEGAD (MISCELLANEOUS)
ELECT CAUTERY BLADE 6.4 (BLADE) ×2 IMPLANT
ELECT REM PT RETURN 9FT ADLT (ELECTROSURGICAL) ×2
ELECTRODE BLDE 4.0 EZ CLN MEGD (MISCELLANEOUS) IMPLANT
ELECTRODE REM PT RTRN 9FT ADLT (ELECTROSURGICAL) ×1 IMPLANT
FACESHIELD LNG OPTICON STERILE (SAFETY) ×4 IMPLANT
GLOVE BIO SURGEON STRL SZ7.5 (GLOVE) ×2 IMPLANT
GLOVE BIO SURGEON STRL SZ8 (GLOVE) ×2 IMPLANT
GLOVE BIO SURGEON STRL SZ8.5 (GLOVE) ×2 IMPLANT
GLOVE BIOGEL PI IND STRL 8 (GLOVE) ×1 IMPLANT
GLOVE BIOGEL PI INDICATOR 8 (GLOVE) ×1
GLOVE ECLIPSE 8.5 STRL (GLOVE) ×2 IMPLANT
GLOVE EUDERMIC 7 POWDERFREE (GLOVE) ×2 IMPLANT
GLOVE SS BIOGEL STRL SZ 7.5 (GLOVE) ×1 IMPLANT
GLOVE SUPERSENSE BIOGEL SZ 7.5 (GLOVE) ×1
GLOVE SURG SS PI 7.5 STRL IVOR (GLOVE) ×2 IMPLANT
GOWN PREVENTION PLUS XXLARGE (GOWN DISPOSABLE) ×2 IMPLANT
GOWN STRL NON-REIN LRG LVL3 (GOWN DISPOSABLE) IMPLANT
GOWN STRL REIN XL XLG (GOWN DISPOSABLE) ×6 IMPLANT
KIT BASIN OR (CUSTOM PROCEDURE TRAY) ×2 IMPLANT
KIT ROOM TURNOVER OR (KITS) ×2 IMPLANT
MANIFOLD NEPTUNE II (INSTRUMENTS) ×2 IMPLANT
NDL SUT 6 .5 CRC .975X.05 MAYO (NEEDLE) ×1 IMPLANT
NEEDLE HYPO 25GX1X1/2 BEV (NEEDLE) IMPLANT
NEEDLE MAYO TAPER (NEEDLE) ×1
NS IRRIG 1000ML POUR BTL (IV SOLUTION) ×2 IMPLANT
PACK SHOULDER (CUSTOM PROCEDURE TRAY) ×2 IMPLANT
PAD ARMBOARD 7.5X6 YLW CONV (MISCELLANEOUS) ×4 IMPLANT
PASSER SUT SWANSON 36MM LOOP (INSTRUMENTS) IMPLANT
SLING ARM FOAM STRAP MED (SOFTGOODS) ×2 IMPLANT
SLING ARM IMMOBILIZER LRG (SOFTGOODS) IMPLANT
SMARTMIX MINI TOWER (MISCELLANEOUS) ×2
SPONGE LAP 18X18 X RAY DECT (DISPOSABLE) ×2 IMPLANT
SPONGE LAP 4X18 X RAY DECT (DISPOSABLE) ×2 IMPLANT
STRIP CLOSURE SKIN 1/2X4 (GAUZE/BANDAGES/DRESSINGS) IMPLANT
SUCTION FRAZIER TIP 10 FR DISP (SUCTIONS) ×2 IMPLANT
SUT BONE WAX W31G (SUTURE) IMPLANT
SUT FIBERWIRE #2 38 T-5 BLUE (SUTURE) ×4
SUT MNCRL AB 3-0 PS2 18 (SUTURE) ×2 IMPLANT
SUT VIC AB 1 CT1 27 (SUTURE) ×1
SUT VIC AB 1 CT1 27XBRD ANBCTR (SUTURE) ×1 IMPLANT
SUT VIC AB 2-0 CT1 27 (SUTURE) ×1
SUT VIC AB 2-0 CT1 TAPERPNT 27 (SUTURE) ×1 IMPLANT
SUTURE FIBERWR #2 38 T-5 BLUE (SUTURE) ×2 IMPLANT
SYR 30ML SLIP (SYRINGE) IMPLANT
SYR CONTROL 10ML LL (SYRINGE) IMPLANT
TOWEL OR 17X24 6PK STRL BLUE (TOWEL DISPOSABLE) ×2 IMPLANT
TOWEL OR 17X26 10 PK STRL BLUE (TOWEL DISPOSABLE) ×2 IMPLANT
TOWER SMARTMIX MINI (MISCELLANEOUS) ×1 IMPLANT
WATER STERILE IRR 1000ML POUR (IV SOLUTION) IMPLANT

## 2011-12-22 NOTE — Anesthesia Preprocedure Evaluation (Addendum)
Anesthesia Evaluation  Patient identified by MRN, date of birth, ID band Patient awake    Reviewed: Allergy & Precautions, H&P , NPO status , Patient's Chart, lab work & pertinent test results, reviewed documented beta blocker date and time   Airway Mallampati: I TM Distance: >3 FB Neck ROM: Full    Dental  (+) Teeth Intact and Dental Advisory Given   Pulmonary  breath sounds clear to auscultation        Cardiovascular hypertension, Pt. on medications and Pt. on home beta blockers Rhythm:Regular Rate:Normal     Neuro/Psych    GI/Hepatic   Endo/Other    Renal/GU      Musculoskeletal   Abdominal   Peds  Hematology   Anesthesia Other Findings   Reproductive/Obstetrics                           Anesthesia Physical Anesthesia Plan  ASA: I  Anesthesia Plan: General   Post-op Pain Management:    Induction: Intravenous  Airway Management Planned: Oral ETT  Additional Equipment:   Intra-op Plan:   Post-operative Plan: Extubation in OR  Informed Consent: I have reviewed the patients History and Physical, chart, labs and discussed the procedure including the risks, benefits and alternatives for the proposed anesthesia with the patient or authorized representative who has indicated his/her understanding and acceptance.   Dental advisory given  Plan Discussed with: CRNA, Anesthesiologist and Surgeon  Anesthesia Plan Comments:         Anesthesia Quick Evaluation

## 2011-12-22 NOTE — Anesthesia Postprocedure Evaluation (Signed)
  Anesthesia Post-op Note  Patient: Michaela Kelley  Procedure(s) Performed: Procedure(s) (LRB) with comments: TOTAL SHOULDER ARTHROPLASTY (Right) - right total shoulder arthroplasty  Patient Location: PACU  Anesthesia Type: GA combined with regional for post-op pain  Level of Consciousness: awake, alert  and oriented  Airway and Oxygen Therapy: Patient Spontanous Breathing and Patient connected to nasal cannula oxygen  Post-op Pain: mild  Post-op Assessment: Post-op Vital signs reviewed  Post-op Vital Signs: Reviewed  Complications: No apparent anesthesia complications

## 2011-12-22 NOTE — Op Note (Signed)
12/22/2011  9:59 AM  PATIENT:   Michaela Kelley  71 y.o. female  PRE-OPERATIVE DIAGNOSIS:  RIGHT SHOULDER OA  POST-OPERATIVE DIAGNOSIS:  same  PROCEDURE:  R TSA #14 stem 44X18 eccentric head, 44 glenoid  SURGEON:  Britanni Yarde, Vania Rea M.D.  ASSISTANTS: Shuford pac   ANESTHESIA:   GET + ISB  EBL: 200  SPECIMEN:  none  Drains: none   PATIENT DISPOSITION:  PACU - hemodynamically stable.    PLAN OF CARE: Admit to inpatient   Dictation# [id number]

## 2011-12-22 NOTE — Progress Notes (Signed)
UR COMPLETED  

## 2011-12-22 NOTE — H&P (Signed)
Michaela Kelley    Chief Complaint: RIGHT SHOULDER OA HPI: The patient is a 71 y.o. female with end stage right shoulder OA  Past Medical History  Diagnosis Date  . Hypertension   . Arthritis   . Seasonal allergic rhinitis   . Hx: UTI (urinary tract infection)     Past Surgical History  Procedure Date  . Knee arthroplasty 2010    L knee  . Dilation and curettage of uterus   . Tonsillectomy   . Back surgery 1999    L5   . Total shoulder arthroplasty 02/10/2011    Procedure: TOTAL SHOULDER ARTHROPLASTY;  Surgeon: Vania Rea Janei Scheff;  Location: [facility] OR;  Service: Orthopedics;  Laterality: Left;  TOTAL SHOULDER ARTHROPLASTY LEFT SIDE    History reviewed. No pertinent family history.  Social History:  reports that she quit smoking about 20 years ago. Her smoking use included Cigarettes. She has a 5 pack-year smoking history. She has never used smokeless tobacco. She reports that she drinks about 4.8 ounces of alcohol per week. She reports that she does not use illicit drugs.  Allergies:  Allergies  Allergen Reactions  . Codeine Nausea And Vomiting and Other (See Comments)    Severe headaches (tolerates Dilaudid)  . Hydrocodone-Acetaminophen Nausea And Vomiting and Other (See Comments)    Severe headaches (tolerates Dilaudid)  . Oxycodone-Acetaminophen Nausea And Vomiting and Other (See Comments)    Severe headaches (tolerates Dilaudid)  . Oxycodone-Aspirin Nausea And Vomiting and Other (See Comments)    Severe headaches (tolerates Dilaudid)    Medications Prior to Admission  Medication Sig Dispense Refill  . atenolol (TENORMIN) 50 MG tablet Take 50 mg by mouth daily.        . diphenhydrAMINE (SOMINEX) 25 MG tablet Take 25 mg by mouth at bedtime as needed. For sleep      . Ergocalciferol (VITAMIN D2) 2000 UNITS TABS Take 2,000 Units by mouth daily.       . fenofibrate micronized (LOFIBRA) 134 MG capsule Take 134 mg by mouth daily.        . fish oil-omega-3 fatty acids 1000 MG  capsule Take 1 g by mouth daily.       Marland Kitchen MAGNESIUM PO Take 1 tablet by mouth daily.        . ranitidine (ZANTAC) 150 MG tablet Take 150 mg by mouth daily as needed. For acid reflux      . aspirin EC 81 MG tablet Take 81 mg by mouth daily.           Physical Exam: right shoulder with painful and restricted ROM as noted at recent office visit  Vitals  Temp:  [98.4 F (36.9 C)] 98.4 F (36.9 C) (09/26 0602) Pulse Rate:  [58-87] 65  (09/26 0743) Resp:  [18] 18  (09/26 0602) BP: (157)/(91) 157/91 mmHg (09/26 0602) SpO2:  [95 %] 95 % (09/26 0602)  Assessment/Plan  Impression: RIGHT SHOULDER OA  Plan of Action: Procedure(s): TOTAL SHOULDER ARTHROPLASTY  Michaela Kelley M 12/22/2011, 7:43 AM

## 2011-12-22 NOTE — Transfer of Care (Signed)
Immediate Anesthesia Transfer of Care Note  Patient: Michaela Kelley  Procedure(s) Performed: Procedure(s) (LRB) with comments: TOTAL SHOULDER ARTHROPLASTY (Right) - right total shoulder arthroplasty  Patient Location: PACU  Anesthesia Type: General and Regional  Level of Consciousness: awake, oriented, sedated and patient cooperative  Airway & Oxygen Therapy: Patient Spontanous Breathing and Patient connected to nasal cannula oxygen  Post-op Assessment: Report given to PACU RN, Post -op Vital signs reviewed and stable and Patient moving all extremities  Post vital signs: Reviewed and stable  Complications: No apparent anesthesia complications

## 2011-12-22 NOTE — Preoperative (Signed)
Beta Blockers   Reason not to administer Beta Blockers:Pt took Atenolol this am.

## 2011-12-22 NOTE — Anesthesia Procedure Notes (Addendum)
Procedure Name: Intubation Date/Time: 12/22/2011 8:02 AM Performed by: Angelica Pou Pre-anesthesia Checklist: Patient identified, Emergency Drugs available, Suction available, Patient being monitored and Timeout performed Patient Re-evaluated:Patient Re-evaluated prior to inductionOxygen Delivery Method: Circle system utilized Preoxygenation: Pre-oxygenation with 100% oxygen Intubation Type: IV induction Ventilation: Mask ventilation without difficulty Laryngoscope Size: Mac and 3 Grade View: Grade I Tube type: Oral Number of attempts: 1 Airway Equipment and Method: Stylet Placement Confirmation: ETT inserted through vocal cords under direct vision,  breath sounds checked- equal and bilateral and positive ETCO2 Secured at: 21 cm Tube secured with: Tape Dental Injury: Teeth and Oropharynx as per pre-operative assessment    Anesthesia Regional Block:  Interscalene brachial plexus block  Pre-Anesthetic Checklist: ,, timeout performed, Correct Patient, Correct Site, Correct Laterality, Correct Procedure, Correct Position, site marked, Risks and benefits discussed,  Surgical consent,  Pre-op evaluation,  At surgeon's request and post-op pain management  Laterality: Right and Upper  Prep: chloraprep       Needles:  Injection technique: Single-shot  Needle Type: Echogenic Needle     Needle Length: 5cm 5 cm Needle Gauge: 21    Additional Needles:  Procedures: ultrasound guided Interscalene brachial plexus block Narrative:  Start time: 12/22/2011 6:55 AM End time: 12/22/2011 7:05 AM Injection made incrementally with aspirations every 5 mL.  Performed by: Personally  Anesthesiologist: Sheldon Silvan  Supraclavicular block

## 2011-12-22 NOTE — Progress Notes (Signed)
Occupational Therapy Evaluation Patient Details Name: Michaela Kelley MRN: [id number] DOB: [birth year] Today's Date: 12/22/2011 Time: 4098-1191 OT Time Calculation (min): 33 min  OT Assessment / Plan / Recommendation Clinical Impression  71 yo s/p R TSA. Began education on HEP and protocol. Pt completed pendulums without c/o. Making excellent progress. Will be able to D/C home with intermittent S of family tomorrow. Will need to f/u with outpt therapy as directed by Dr. supple. Will see again in am prior to D/C.    OT Assessment  Patient needs continued OT Services    Follow Up Recommendations  Outpatient OT    Barriers to Discharge None    Equipment Recommendations  None recommended by OT    Recommendations for Other Services    Frequency  Min 2X/week    Precautions / Restrictions Precautions Precautions: Shoulder Type of Shoulder Precautions: supple protocol Precaution Booklet Issued: Yes (comment) Required Braces or Orthoses: Other Brace/Splint (sling) Restrictions Weight Bearing Restrictions: Yes RUE Weight Bearing: Non weight bearing Other Position/Activity Restrictions: shoulder   Pertinent Vitals/Pain none    ADL  Eating/Feeding: Simulated;Set up Where Assessed - Eating/Feeding: Chair Grooming: Simulated;Moderate assistance Where Assessed - Grooming: Supported sitting Upper Body Bathing: Simulated;Moderate assistance Where Assessed - Upper Body Bathing: Supported sitting Lower Body Bathing: Simulated;Set up Where Assessed - Lower Body Bathing: Supported sit to stand Upper Body Dressing: Simulated;Moderate assistance Where Assessed - Upper Body Dressing: Supported sitting Lower Body Dressing: Simulated;Set up Where Assessed - Lower Body Dressing: Supported sit to stand Toilet Transfer: Counsellor Method: Sit to Barista: Comfort height toilet Toileting - Clothing Manipulation and Hygiene: Simulated;Minimal  assistance Where Assessed - Engineer, mining and Hygiene: Standing Equipment Used: Gait belt;Other (comment) (sling) Transfers/Ambulation Related to ADLs: S ADL Comments: Began education on compensatory techniques    OT Diagnosis: Generalized weakness  OT Problem List: Decreased strength;Decreased range of motion;Decreased coordination;Decreased knowledge of precautions;Impaired sensation;Impaired UE functional use OT Treatment Interventions: Self-care/ADL training;Therapeutic exercise;Therapeutic activities;Patient/family education   OT Goals Acute Rehab OT Goals OT Goal Formulation: With patient Time For Goal Achievement: 12/29/11 Potential to Achieve Goals: Good ADL Goals Pt Will Perform Upper Body Bathing: with supervision;Standing at sink;Unsupported;with cueing (comment type and amount) ADL Goal: Upper Body Bathing - Progress: Goal set today Pt Will Perform Upper Body Dressing: with min assist;Sitting, chair;Sit to stand from chair;Unsupported;with cueing (comment type and amount) ADL Goal: Upper Body Dressing - Progress: Goal set today Additional ADL Goal #1: Demonstrate donning/doffing sling with S. ADL Goal: Additional Goal #1 - Progress: Goal set today Additional ADL Goal #2: Demonstrate proper positioning of R UE ADL Goal: Additional Goal #2 - Progress: Goal set today Arm Goals Additional Arm Goal #1: Complete HEP per Supple protocol independently. Arm Goal: Additional Goal #1 - Progress: Goal set today  Visit Information  Last OT Received On: 12/22/11 Assistance Needed: +1    Subjective Data      Prior Functioning     Home Living Lives With: Daughter Available Help at Discharge: Available PRN/intermittently;Other (Comment);Available 24 hours/day (daughter works during the day. 24/7 available if needed) Type of Home: House Home Access: Stairs to enter [employer] of Steps: 2 Entrance Stairs-Rails: None Home Layout: One level Bathroom  Shower/Tub: Forensic scientist: Handicapped height Bathroom Accessibility: Yes How Accessible: Accessible via walker Home Adaptive Equipment: Straight cane Prior Function Level of Independence: Independent Able to Take Stairs?: Yes Driving: Yes Vocation: Retired Musician: No difficulties Dominant Hand:  Right         Vision/Perception     Cognition  Overall Cognitive Status: Appears within functional limits for tasks assessed/performed Arousal/Alertness: Awake/alert Orientation Level: Appears intact for tasks assessed Behavior During Session: [facility] for tasks performed    Extremity/Trunk Assessment Right Upper Extremity Assessment RUE ROM/Strength/Tone: Deficits;Due to precautions RUE ROM/Strength/Tone Deficits: n block RUE Sensation: Deficits (due to n block) Left Upper Extremity Assessment LUE ROM/Strength/Tone: [facility] for tasks assessed Right Lower Extremity Assessment RLE ROM/Strength/Tone: [facility] for tasks assessed Left Lower Extremity Assessment LLE ROM/Strength/Tone: [facility] for tasks assessed Trunk Assessment Trunk Assessment: Normal     Mobility Bed Mobility Bed Mobility: Supine to Sit;Sitting - Scoot to Edge of Bed Supine to Sit: 6: Modified independent (Device/Increase time) Sitting - Scoot to Edge of Bed: 6: Modified independent (Device/Increase time) Transfers Transfers: Sit to Stand;Stand to Sit Sit to Stand: 5: Supervision Stand to Sit: 5: Supervision     Shoulder Instructions ROM for elbow, wrist and digits of operated UE: Patient able to independently direct caregiver Sling wearing schedule (on at all times/off for ADL's): Independent Positioning of UE while sleeping: Patient able to independently direct caregiver   Exercise Shoulder Exercises Pendulum Exercise: 10 reps;Standing;Right Digit Composite Flexion: Right;10 reps Composite Extension: Right;10 reps   Balance     End of Session OT - End of  Session Equipment Utilized During Treatment: Gait belt Activity Tolerance: Patient tolerated treatment well Patient left: in chair;with call bell/phone within reach;with family/visitor present Nurse Communication: Mobility status;Precautions;Weight bearing status  GO     Haylie Mccutcheon,HILLARY 12/22/2011, 5:30 PM [facility], OTR/L  [phone] 12/22/2011

## 2011-12-23 ENCOUNTER — Encounter (HOSPITAL_COMMUNITY): Payer: Self-pay | Admitting: Orthopedic Surgery

## 2011-12-23 MED ORDER — HYDROMORPHONE HCL 2 MG PO TABS: 2.0000 mg | ORAL_TABLET | ORAL | Status: DC | PRN

## 2011-12-23 MED ORDER — METHOCARBAMOL 500 MG PO TABS: 500.0000 mg | ORAL_TABLET | Freq: Three times a day (TID) | ORAL | Status: DC | PRN

## 2011-12-23 NOTE — Discharge Summary (Signed)
Inpatient Rehab HomeHomeHomePATIENT ID:      Michaela Kelley  MRN:     [id number] DOB/AGE:    10-19-1940 / 71 y.o.     DISCHARGE SUMMARY  ADMISSION DATE:    12/22/2011 DISCHARGE DATE:    ADMISSION DIAGNOSIS: RIGHT SHOULDER OA Past Medical History  Diagnosis Date  . Hypertension   . Arthritis   . Seasonal allergic rhinitis   . Hx: UTI (urinary tract infection)     DISCHARGE DIAGNOSIS:   Active Problems:  Shoulder arthritis   PROCEDURE: Procedure(s): TOTAL SHOULDER ARTHROPLASTY on 12/22/2011  CONSULTS:     HISTORY:  See H&P in chart.  HOSPITAL COURSE:  Michaela Kelley is a 71 y.o. admitted on 12/22/2011 with a chief complaint of R shoulder pain and stiffness, and found to have a diagnosis of RIGHT SHOULDER OA.  They were brought to the operating room on 12/22/2011 and underwent Procedure(s): TOTAL SHOULDER ARTHROPLASTY.    They were given perioperative antibiotics: Anti-infectives     Start     Dose/Rate Route Frequency Ordered Stop   12/22/11 1400   ceFAZolin (ANCEF) IVPB 1 g/50 mL premix        1 g 100 mL/hr over 30 Minutes Intravenous Every 6 hours 12/22/11 1230 12/23/11 0230   12/21/11 1427   ceFAZolin (ANCEF) 3 g in dextrose 5 % 50 mL IVPB        3 g 160 mL/hr over 30 Minutes Intravenous 60 min pre-op 12/21/11 1427 12/22/11 0805        .  Patient underwent the above named procedure and tolerated it well. The following day they were hemodynamically stable and pain was controlled on oral analgesics. They were neurovascularly intact to the operative extremity. OT was ordered and worked with patient per protocol. They were medically and orthopaedically stable for discharge on POD 1.     DIAGNOSTIC STUDIES:     RECENT RADIOGRAPHIC STUDIES :  No results found.  RECENT VITAL SIGNS:  Patient Vitals for the past 24 hrs:  BP Temp Temp src Pulse Resp SpO2  12/23/11 0502 107/72 mmHg 98.4 F (36.9 C) - 68  18  97 %  12/23/11 0200 118/63 mmHg 98.9 F (37.2 C) - 63  18  98 %   12/22/11 2149 125/81 mmHg 98.1 F (36.7 C) - 65  18  100 %  12/22/11 1110 151/73 mmHg 97.4 F (36.3 C) Oral 53  16  99 %  12/22/11 1100 143/73 mmHg - - 52  18  100 %  12/22/11 1045 142/76 mmHg 98.2 F (36.8 C) - 51  16  100 %  12/22/11 1030 147/67 mmHg - - 47  18  100 %  12/22/11 1018 144/69 mmHg - - - - -  12/22/11 1015 - - - 53  16  99 %  12/22/11 1003 - 97.4 F (36.3 C) - 55  14  98 %  12/22/11 1002 133/65 mmHg - - - - -  .  RECENT EKG RESULTS:   No orders found for this or any previous visit.  DISCHARGE INSTRUCTIONS:    DISCHARGE MEDICATIONS:     Medication List     As of 12/23/2011  8:20 AM    TAKE these medications         aspirin EC 81 MG tablet   Take 81 mg by mouth daily.      atenolol 50 MG tablet   Commonly known as: TENORMIN   Take 50 mg by  mouth daily.      diphenhydrAMINE 25 MG tablet   Commonly known as: SOMINEX   Take 25 mg by mouth at bedtime as needed. For sleep      fenofibrate micronized 134 MG capsule   Commonly known as: LOFIBRA   Take 134 mg by mouth daily.      fish oil-omega-3 fatty acids 1000 MG capsule   Take 1 g by mouth daily.      HYDROmorphone 2 MG tablet   Commonly known as: DILAUDID   Take 1-2 tablets (2-4 mg total) by mouth every 4 (four) hours as needed for pain.      MAGNESIUM PO   Take 1 tablet by mouth daily.      methocarbamol 500 MG tablet   Commonly known as: ROBAXIN   Take 1 tablet (500 mg total) by mouth 3 (three) times daily as needed.      ranitidine 150 MG tablet   Commonly known as: ZANTAC   Take 150 mg by mouth daily as needed. For acid reflux      Vitamin D2 2000 UNITS Tabs   Take 2,000 Units by mouth daily.        FOLLOW UP VISIT:       Follow-up Information    Follow up with SUPPLE,KEVIN M, MD. (call to be seen in 10-14 days)    Contact information:   [facility] [address], [address] [place] [address] [phone]          DISCHARGE FA:OZHY IN  {GoodCondition  DISPOSITION: 01-Home or Self Care  DISCHARGE CONDITION:  {Good  Kelda Azad for Dr. Francena Hanly 12/23/2011, 8:20 AM

## 2011-12-23 NOTE — Progress Notes (Signed)
Discharge instructions given and reviewed with patient and family member. No complaints at this time. Patient discharged home with family.

## 2011-12-23 NOTE — Op Note (Signed)
Michaela Kelley, DIEFENBACH                 ACCOUNT NO.:  [id number]  MEDICAL RECORD NO.:  [id number]  LOCATION:  [id number]                        FACILITY:  [facility]  PHYSICIAN:  Vania Rea. Aleksi Brummet, M.D.  DATE OF BIRTH:  Jul 27, 1940  DATE OF PROCEDURE:  12/22/2011 DATE OF DISCHARGE:                              OPERATIVE REPORT   PREOPERATIVE DIAGNOSIS:  End-stage right shoulder osteoarthrosis.  POSTOPERATIVE DIAGNOSIS:  End-stage right shoulder osteoarthrosis.  PROCEDURE:  Right total shoulder arthroplasty utilizing a press-fit size 14 [employer] stem with a 44 x 18 eccentric head and a 44 cemented peg glenoid.  SURGEON:  Vania Rea. Tashari Schoenfelder, M.D.  Threasa HeadsFrench Ana A. Shuford, PA-C  ANESTHESIA:  General endotracheal as well as an interscalene block.  ESTIMATED BLOOD LOSS:  200 mL.  DRAINS:  None.  HISTORY:  Ms. Fritze is a 71 year old female who has had progressive increasing right shoulder pain related to end-stage osteoarthritis.  She has had a previous left total shoulder arthroplasty which she did very well with and brought to the operating room at this time for planned right total shoulder arthroplasty.  Preoperatively, I counseled Ms. Hebb on treatment options as well as risks versus benefits thereof.  Possible surgical complications were reviewed including potential for bleeding, infection, neurovascular injury, persistent pain, loss of motion, anesthetic complication, possible need for additional surgery.  She understands and accepts and agrees to the planned procedure.  PROCEDURE IN DETAIL:  After undergoing routine preop evaluation, the patient received prophylactic antibiotics and an interscalene block was established in the holding area by the Anesthesia Department.  Placed supine on the operative table, underwent smooth induction of a general endotracheal anesthesia.  Placed in beach-chair position and appropriately padded protected.  Right shoulder girdle region was  then sterilely prepped and draped in standard fashion.  Time-out was called. An anterior deltopectoral approach in right shoulder was made through a 15 cm incision beginning at the coracoid extending laterally and distally.  Skin flaps were elevated and electrocautery was used for hemostasis.  The cephalic vein was identified and retracted laterally the deltoid.  The pec major retracted medially and the upper centimeter of the pec tendon was tenotomized to enhance visualization.  Conjoined tendon was identified, mobilized, and retracted medially.  Adhesions in the subdeltoid bursal region were then divided allowing full exposure. There was noted to be a very large bursal fluid collection and a bursectomy was performed.  We then unroofed biceps tendon, performed biceps tenotomy for later tenodesis and then made a split from the bicipital groove to the base of the coracoid along the rotator interval and then divided the subscapularis from the lesser tuberosity leaving a 1.5 cm cuff of soft tissue for later repair.  The free margin of the subscapularis was then tagged with a series of grasping #2 FiberWire sutures.  We then released the capsule from the anterior and inferior aspects of the humeral head allowing complete delivery of the humeral head through the wound.  Care was taken to protect the rotator cuff superiorly and posteriorly, and there was some significant degeneration and fraying of the rotator cuff of the distal supraspinatus which we did perform oversewing and  repair of it at the end of the case.  Once the humeral head was then delivered, we outlined our proposed humeral head resection with the extramedullary guide.  We used oscillating saw to resect the humeral head.  We then used hand reaming to gain access to the humeral medullary canal reaming up to size 14 and then broached up to size 14 maintaining approximately 30 degrees of retroversion.  Once this was completed, we  then placed a metal cap over the cut metaphysis of the humerus and then exposed the glenoid.  At this point, using a Cuda pitchfork and snake tongue retractor.  We performed a circumferential labral excision and released capsular adhesions and mobilized the subscapularis anteriorly and removed some bone spurs inferiorly with care taken to protect the contents of the axillary pouch.  Once we had complete exposure of the glenoid, we placed a central guide pin, reamed with a 44, removed residual bone at the margin of the glenoid, placed a central drill hole, and then using the guide, placed the peripheral drill holes.  The trial glenoid 44 showed excellent fit.  The glenoid was then irrigated.  We mixed cement and after appropriate consistency, the peripheral peg holes were filled with cement and the final 44 peg glenoid was impacted into position with excellent fit and fixation.  Cement was allowed to harden.  With this complete, we returned our attention to the humerus, where we impacted the size 14 humeral stem to the appropriate level at approximately 30 degrees of retroversion.  We then performed trial reductions and the 44 x 18 eccentric humeral head at the best soft tissue balance with good coverage of the humerus.  The [facility] taper was meticulously cleaned and dried.  The final head was impacted into position.  Final reduction performed.  We did achieve 50% translation of the humeral head and glenoid with good soft tissue balance.  At this point, we confirmed that the subscapularis was appropriately mobilized and was repaired back to the lesser tuberosity utilizing #2 FiberWires.  We also closed the rotator interval anterolaterally with figure-of-eight FiberWire sutures and also performed suture repair of some of the frayed portions of the distal supraspinatus with excellent soft tissue coverage in the region. Also performed a biceps tenodesis using the #2 FiberWires as well.   The wound was then copiously irrigated.  Hemostasis was obtained.  The deltopectoral interval was then reapproximated with 0 Vicryl sutures, 2- 0 Vicryl used for the subcu layer, and intracuticular 3-0 Monocryl for the skin followed by Steri-Strips.  Dry dressing placed over the right shoulder.  Right hand was placed into a sling, and the patient was then awakened, extubated, and taken to recovery room in stable condition.     Vania Rea. Samwise Eckardt, M.D.     KMS/MEDQ  D:  12/22/2011  T:  12/23/2011  Job:  [id number]

## 2011-12-23 NOTE — Progress Notes (Signed)
Occupational Therapy Treatment Patient Details Name: CLOVA MORLOCK MRN: [id number] DOB: 08/04/1940 Today's Date: 12/23/2011 Time: 4098-1191 OT Time Calculation (min): 24 min  OT Assessment / Plan / Recommendation Comments on Treatment Session Excellent progress. Completed all education regarding ADL and HEP. Pt with 90 FF, 30 ER, 60 ABD PROM. Daughter present. Given handouts. to f/u with Dr. Rennis Chris in clinic.    Follow Up Recommendations  Outpatient OT    Barriers to Discharge   none    Equipment Recommendations  None recommended by OT    Recommendations for Other Services  none  Frequency Min 2X/week   Plan Discharge plan remains appropriate    Precautions / Restrictions Precautions Precautions: Shoulder Type of Shoulder Precautions: supple protocol Precaution Booklet Issued: Yes (comment) Required Braces or Orthoses: Other Brace/Splint Other Brace/Splint: sling for comfort and sleep Restrictions Weight Bearing Restrictions: Yes RUE Weight Bearing: Non weight bearing   Pertinent Vitals/Pain 3    ADL  ADL Comments: Completed ADl education and education on protocol    OT Diagnosis:    OT Problem List:   OT Treatment Interventions:     OT Goals Acute Rehab OT Goals OT Goal Formulation: With patient ADL Goals Pt Will Perform Upper Body Bathing: with supervision;Standing at sink;Unsupported;with cueing (comment type and amount) ADL Goal: Upper Body Bathing - Progress: Met Pt Will Perform Upper Body Dressing: with min assist;Sitting, chair;Sit to stand from chair;Unsupported;with cueing (comment type and amount) ADL Goal: Upper Body Dressing - Progress: Met Additional ADL Goal #1: Demonstrate donning/doffing sling with S. ADL Goal: Additional Goal #1 - Progress: Met Additional ADL Goal #2: Demonstrate proper positioning of R UE ADL Goal: Additional Goal #2 - Progress: Met Arm Goals Additional Arm Goal #1: Complete HEP per Supple protocol independently. Arm Goal:  Additional Goal #1 - Progress: Met  Visit Information  Last OT Received On: 12/23/11 Assistance Needed: +1    Subjective Data      Prior Functioning       Cognition       Mobility  Shoulder Instructions Transfers Transfers: Sit to Stand;Stand to Sit Sit to Stand: 6: Modified independent (Device/Increase time) Stand to Sit: 6: Modified independent (Device/Increase time)   Donning/doffing shirt without moving shoulder: Modified independent Method for sponge bathing under operated UE: Modified independent Donning/doffing sling/immobilizer: Modified independent Correct positioning of sling/immobilizer: Modified independent Pendulum exercises (written home exercise program): Independent ROM for elbow, wrist and digits of operated UE: Independent Sling wearing schedule (on at all times/off for ADL's): Independent Proper positioning of operated UE when showering: Independent Positioning of UE while sleeping: Independent   Exercises  General Exercises - Upper Extremity Shoulder Flexion: PROM;10 reps;Supine;Other (comment) (tp 90) Shoulder ABduction: PROM;Right;10 reps;Supine;Other (comment) (to 60) Shoulder Exercises Pendulum Exercise: 10 reps;Standing;Right Shoulder External Rotation: PROM;10 reps;Limitations (to 30) Elbow Flexion: AROM;Right;10 reps Elbow Extension: AROM;Right;10 reps Digit Composite Flexion: Right;10 reps Composite Extension: Right;10 reps   Balance     End of Session OT - End of Session Equipment Utilized During Treatment: Gait belt Activity Tolerance: Patient tolerated treatment well Patient left: in chair;with call bell/phone within reach;with family/visitor present Nurse Communication: Mobility status;Precautions;Weight bearing status  GO     Lavona Norsworthy,HILLARY 12/23/2011, 10:15 AM Luisa Dago, OTR/L  [phone] 12/23/2011

## 2012-11-05 ENCOUNTER — Other Ambulatory Visit: Payer: Self-pay

## 2012-11-05 DIAGNOSIS — Z1231 Encounter for screening mammogram for malignant neoplasm of breast: Secondary | ICD-10-CM

## 2012-12-27 ENCOUNTER — Ambulatory Visit: Payer: Medicare Other

## 2013-01-04 ENCOUNTER — Ambulatory Visit: Payer: Medicare Other

## 2013-01-04 ENCOUNTER — Ambulatory Visit
Admission: RE | Admit: 2013-01-04 | Discharge: 2013-01-04 | Disposition: A | Discharge status: Home / Self Care | Payer: Medicare Other | Source: Ambulatory Visit

## 2013-01-04 DIAGNOSIS — Z1231 Encounter for screening mammogram for malignant neoplasm of breast: Secondary | ICD-10-CM

## 2013-01-04 LAB — HM MAMMOGRAPHY: HM Mammogram: NORMAL

## 2013-02-06 ENCOUNTER — Encounter: Payer: Self-pay | Admitting: Emergency Medicine

## 2013-03-24 DIAGNOSIS — J302 Other seasonal allergic rhinitis: Secondary | ICD-10-CM | POA: Insufficient documentation

## 2013-03-24 DIAGNOSIS — E785 Hyperlipidemia, unspecified: Secondary | ICD-10-CM | POA: Insufficient documentation

## 2013-03-24 DIAGNOSIS — E559 Vitamin D deficiency, unspecified: Secondary | ICD-10-CM | POA: Insufficient documentation

## 2013-03-24 DIAGNOSIS — R7303 Prediabetes: Secondary | ICD-10-CM | POA: Insufficient documentation

## 2013-03-24 DIAGNOSIS — M1990 Unspecified osteoarthritis, unspecified site: Secondary | ICD-10-CM | POA: Insufficient documentation

## 2013-03-25 ENCOUNTER — Ambulatory Visit (INDEPENDENT_AMBULATORY_CARE_PROVIDER_SITE_OTHER): Payer: Medicare Other | Admitting: Physician Assistant

## 2013-03-25 ENCOUNTER — Encounter: Payer: Self-pay | Admitting: Physician Assistant

## 2013-03-25 VITALS — BP 122/72 | HR 64 | Temp 97.7°F | Resp 16 | Ht 62.5 in | Wt 139.0 lb

## 2013-03-25 DIAGNOSIS — M810 Age-related osteoporosis without current pathological fracture: Secondary | ICD-10-CM

## 2013-03-25 DIAGNOSIS — Z79899 Other long term (current) drug therapy: Secondary | ICD-10-CM

## 2013-03-25 DIAGNOSIS — Z0000 Encounter for general adult medical examination without abnormal findings: Secondary | ICD-10-CM

## 2013-03-25 DIAGNOSIS — E785 Hyperlipidemia, unspecified: Secondary | ICD-10-CM

## 2013-03-25 DIAGNOSIS — E559 Vitamin D deficiency, unspecified: Secondary | ICD-10-CM

## 2013-03-25 DIAGNOSIS — I10 Essential (primary) hypertension: Secondary | ICD-10-CM

## 2013-03-25 DIAGNOSIS — R7303 Prediabetes: Secondary | ICD-10-CM

## 2013-03-25 LAB — CBC WITH DIFFERENTIAL/PLATELET
Eosinophils Absolute: 0.1 10*3/uL (ref 0.0–0.7)
Eosinophils Relative: 1 % (ref 0–5)
Lymphs Abs: 3 10*3/uL (ref 0.7–4.0)
MCH: 30.5 pg (ref 26.0–34.0)
MCV: 88.7 fL (ref 78.0–100.0)
Platelets: 374 10*3/uL (ref 150–400)
RBC: 4.33 MIL/uL (ref 3.87–5.11)
RDW: 13.8 % (ref 11.5–15.5)

## 2013-03-25 LAB — HEMOGLOBIN A1C
Hgb A1c MFr Bld: 5.3 % (ref <5.7)
Mean Plasma Glucose: 105 mg/dL (ref <117)

## 2013-03-25 LAB — HEPATIC FUNCTION PANEL
ALT: 26 U/L (ref 0–35)
AST: 22 U/L (ref 0–37)
Albumin: 4.5 g/dL (ref 3.5–5.2)
Total Bilirubin: 0.5 mg/dL (ref 0.3–1.2)
Total Protein: 7.2 g/dL (ref 6.0–8.3)

## 2013-03-25 LAB — BASIC METABOLIC PANEL WITH GFR
CO2: 26 mEq/L (ref 19–32)
Calcium: 9.6 mg/dL (ref 8.4–10.5)
Creat: 0.81 mg/dL (ref 0.50–1.10)

## 2013-03-25 LAB — LIPID PANEL
Cholesterol: 208 mg/dL — ABNORMAL HIGH (ref 0–200)
Total CHOL/HDL Ratio: 2.4 Ratio

## 2013-03-25 NOTE — Patient Instructions (Addendum)
The shingles vaccine would be 45 dollars here, please check to see how much that would be at the pharmacy. You can get it there or here.   Diet for Gastroesophageal Reflux Disease, Adult Reflux (acid reflux) is when acid from your stomach flows up into the esophagus. When acid comes in contact with the esophagus, the acid causes irritation and soreness (inflammation) in the esophagus. When reflux happens often or so severely that it causes damage to the esophagus, it is called gastroesophageal reflux disease (GERD). Nutrition therapy can help ease the discomfort of GERD. FOODS OR DRINKS TO AVOID OR LIMIT  Smoking or chewing tobacco. Nicotine is one of the most potent stimulants to acid production in the gastrointestinal tract.  Caffeinated and decaffeinated coffee and black tea.  Regular or low-calorie carbonated beverages or energy drinks (caffeine-free carbonated beverages are allowed).   Strong spices, such as black pepper, white pepper, red pepper, cayenne, curry powder, and chili powder.  Peppermint or spearmint.  Chocolate.  High-fat foods, including meats and fried foods. Extra added fats including oils, butter, salad dressings, and nuts. Limit these to less than 8 tsp per day.  Fruits and vegetables if they are not tolerated, such as citrus fruits or tomatoes.  Alcohol.  Any food that seems to aggravate your condition. If you have questions regarding your diet, call your caregiver or a registered dietitian. OTHER THINGS THAT MAY HELP GERD INCLUDE:   Eating your meals slowly, in a relaxed setting.  Eating 5 to 6 small meals per day instead of 3 large meals.  Eliminating food for a period of time if it causes distress.  Not lying down until 3 hours after eating a meal.  Keeping the head of your bed raised 6 to 9 inches (15 to 23 cm) by using a foam wedge or blocks under the legs of the bed. Lying flat may make symptoms worse.  Being physically active. Weight loss may be  helpful in reducing reflux in overweight or obese adults.  Wear loose fitting clothing EXAMPLE MEAL PLAN This meal plan is approximately 2,000 calories based on https://www.bernard.org/ meal planning guidelines. Breakfast   cup cooked oatmeal.  1 cup strawberries.  1 cup low-fat milk.  1 oz almonds. Snack  1 cup cucumber slices.  6 oz yogurt (made from low-fat or fat-free milk). Lunch  2 slice whole-wheat bread.  2 oz sliced [place].  2 tsp mayonnaise.  1 cup blueberries.  1 cup snap peas. Snack  6 whole-wheat crackers.  1 oz string cheese. Dinner   cup brown rice.  1 cup mixed veggies.  1 tsp olive oil.  3 oz grilled fish. Document Released: 03/14/2005 Document Revised: 06/06/2011 Document Reviewed: 01/28/2011 [facility] Patient Information 2014 [place], [place].  Please get the white salt substitute, 1/4 teaspoon of this is equivalent to potassium.   High potassium foods from natural food sources like beans, dark leafy greens, potatoes, squash, yogurt, fish, avocados, mushrooms, and bananas, are considered safe and healthy.

## 2013-03-25 NOTE — Progress Notes (Signed)
Complete Physical HPI Patient presents for complete physical.   Patient's blood pressure has been controlled at home. Patient denies chest pain, shortness of breath, dizziness. BP: 122/72 mmHg  Patient's cholesterol is diet controlled. She has been off of fenofibrate for 3-4 months, The patient's cholesterol last visit was LDL 90, Trigs 76, and HDL 75. She has arthritis and has injections, but she wanted to try to get off her cholesterol medication to see if that helps. She does not know if it has helped but she is on tumeric as well and stater her aches have improved.  The patient has been working on diet and exercise for prediabetes, denies changes in vision, polys, and paresthesias. Last A1C in office was 6.0.    Current Medications:  Current Outpatient Prescriptions on File Prior to Visit  Medication Sig Dispense Refill  . aspirin EC 81 MG tablet Take 81 mg by mouth daily.        Marland Kitchen atenolol (TENORMIN) 50 MG tablet Take 50 mg by mouth daily.        . fenofibrate micronized (LOFIBRA) 134 MG capsule Take 134 mg by mouth daily.        . fish oil-omega-3 fatty acids 1000 MG capsule Take 1 g by mouth daily.       Marland Kitchen HYDROmorphone (DILAUDID) 2 MG tablet Take 1-2 tablets (2-4 mg total) by mouth every 4 (four) hours as needed for pain.  50 tablet  0  . MAGNESIUM PO Take 1 tablet by mouth daily.        . methocarbamol (ROBAXIN) 500 MG tablet Take 1 tablet (500 mg total) by mouth 3 (three) times daily as needed.  30 tablet  1  . ranitidine (ZANTAC) 150 MG tablet Take 150 mg by mouth daily as needed. For acid reflux       No current facility-administered medications on file prior to visit.   Health Maintenance:  Tetanus: 2013 Pneumovax: 2008 Flu vaccine: declines Zostavax: wants  Pap: 2011 normal MGM:01/04/2013 normal DEXA: Osteoporosis 2011 Colonoscopy:2008 neg- due 2018 (Dr. Jarold Motto)   Allergies:  Allergies  Allergen Reactions  . Codeine Nausea And Vomiting and Other (See Comments)   Severe headaches (tolerates Dilaudid)  . Hydrocodone-Acetaminophen Nausea And Vomiting and Other (See Comments)    Severe headaches (tolerates Dilaudid)  . Oxycodone-Acetaminophen Nausea And Vomiting and Other (See Comments)    Severe headaches (tolerates Dilaudid)  . Oxycodone-Aspirin Nausea And Vomiting and Other (See Comments)    Severe headaches (tolerates Dilaudid)   Medical History:  Past Medical History  Diagnosis Date  . Hypertension   . Hx: UTI (urinary tract infection)   . Seasonal allergic rhinitis   . Arthritis   . Hyperlipidemia   . Prediabetes   . Vitamin D deficiency    Surgical History:  Past Surgical History  Procedure Laterality Date  . Knee arthroplasty  2010    L knee  . Dilation and curettage of uterus    . Tonsillectomy    . Back surgery  1999    L5   . Total shoulder arthroplasty  02/10/2011    Procedure: TOTAL SHOULDER ARTHROPLASTY;  Surgeon: Vania Rea Supple;  Location: [facility] OR;  Service: Orthopedics;  Laterality: Left;  TOTAL SHOULDER ARTHROPLASTY LEFT SIDE  . Total shoulder arthroplasty  12/22/2011    Procedure: TOTAL SHOULDER ARTHROPLASTY;  Surgeon: Senaida Lange, MD;  Location: [facility] OR;  Service: Orthopedics;  Laterality: Right;  right total shoulder arthroplasty   Family History:  Family  History  Problem Relation Age of Onset  . Cancer Mother   . Hypertension Father   . Heart disease Brother    Social History:  History   Social History  . Marital Status: Widowed    Spouse Name: N/A    Number of Children: N/A  . Years of Education: N/A   Occupational History  . Not on file.   Social History Main Topics  . Smoking status: Former Smoker -- 0.25 packs/day for 20 years    Types: Cigarettes    Quit date: 12/16/1991  . Smokeless tobacco: Never Used  . Alcohol Use: 4.8 oz/week    8 Glasses of wine per week  . Drug Use: No  . Sexual Activity: Not on file   Other Topics Concern  . Not on file   Social History Narrative  . No narrative on  file   ROS Constitutional: Denies weight loss/gain, headaches, insomnia, fatigue, night sweats, and change in appetite. Eyes: Contacts DEE normal 01/2013 ([place] [facility]) Denies redness, blurred vision, diplopia, discharge, itchy, watery eyes.  ENT: Denies discharge, congestion, post nasal drip, sore throat, earache, hearing loss, dental pain, Tinnitus, Vertigo, Sinus pain, snoring.  Cardio: Denies chest pain, palpitations, irregular heartbeat, dyspnea, diaphoresis, orthopnea, PND, claudication, edema Respiratory: denies cough, dyspnea, pleurisy, hoarseness, wheezing.  Gastrointestinal:([place]) Denies dysphagia, heartburn, pain, cramps, nausea, vomiting, bloating, diarrhea, constipation, hematemesis, melena, hematochezia, hemorrhoids Genitourinary: Denies dysuria, frequency, urgency, nocturia, hesitancy, discharge, hematuria, flank pain Breast: Denies Breast lumps, nipple discharge, bleeding.  Musculoskeletal: [facility]) Occ leg cramps at night Denies arthralgia, myalgia, stiffness, Jt. Swelling, pain, Skin: Denies pruritis, rash, hives,  acne, eczema, changing in skin lesion Neuro: Denies Weakness, tremor, incoordination, spasms, paresthesia, pain Psychiatric: Denies confusion, memory loss, sensory loss Endocrine: Denies change in weight, skin, hair change, nocturia, and paresthesia, Diabetic Denies Polys, visual blurring, hyper /hypo glycemic episodes.  Heme/Lymph: Denies Excessive bleeding, bruising, enlarged lymph nodes  Physical Exam: Estimated body mass index is 25 kg/(m^2) as calculated from the following:   Height as of this encounter: 5' 2.5" (1.588 m).   Weight as of this encounter: 139 lb (63.05 kg). Filed Vitals:   03/25/13 1414  BP: 122/72  Pulse: 64  Temp: 97.7 F (36.5 C)  Resp: 16   General Appearance: Well nourished, in no apparent distress. Eyes: PERRLA, EOMs, conjunctiva no swelling or erythema, normal fundi and vessels. Sinuses: No Frontal/maxillary  tenderness ENT/Mouth: Ext aud canals clear, normal light reflex with TMs without erythema, bulging.  Good dentition. No erythema, swelling, or exudate on post pharynx. Tonsils not swollen or erythematous. Hearing normal.  Neck: Supple, thyroid normal. No bruits Respiratory: Respiratory effort normal, BS equal bilaterally without rales, rhonchi, wheezing or stridor. Cardio: RRR without murmurs, rubs or gallops. Brisk peripheral pulses without edema.  Chest: symmetric, with normal excursions and percussion. Breasts: Symmetric, without lumps, nipple discharge, retractions. Abdomen: Soft, +BS. Non tender, no guarding, rebound, hernias, masses, or organomegaly. .  Lymphatics: Non tender without lymphadenopathy.  Genitourinary: defer Musculoskeletal: Full ROM all peripheral extremities,5/5 strength, and normal gait. Skin: Warm, dry without rashes, lesions, ecchymosis.  Neuro: Cranial nerves intact, reflexes equal bilaterally. Normal muscle tone, no cerebellar symptoms. Sensation intact.  Psych: Awake and oriented X 3, normal affect, Insight and Judgment appropriate.   EKG: WNL no changes  Assessment and Plan: Hypertension- at goal  recurrent UTI (urinary tract infection)- check urine  Seasonal allergic rhinitis- controlled  Arthritis- continue tumeric and water aerobics  Hyperlipidemia- will check chol since off  fenofibrate  Prediabetes- check A1C  Vitamin D deficiency- check level  Check on zostavax next visit Needs DEXA Discussed med's effects and SE's. Screening labs and tests as requested with regular follow-up as recommended.   Quentin Mulling 2:26 PM

## 2013-03-26 LAB — INSULIN, FASTING: Insulin fasting, serum: 10 u[IU]/mL (ref 3–28)

## 2013-03-26 LAB — URINALYSIS, ROUTINE W REFLEX MICROSCOPIC
Bilirubin Urine: NEGATIVE
Glucose, UA: NEGATIVE mg/dL
Hgb urine dipstick: NEGATIVE
Ketones, ur: NEGATIVE mg/dL
Leukocytes, UA: NEGATIVE
Nitrite: NEGATIVE
Protein, ur: NEGATIVE mg/dL
Specific Gravity, Urine: 1.011 (ref 1.005–1.030)
Urobilinogen, UA: 0.2 mg/dL (ref 0.0–1.0)
pH: 6 (ref 5.0–8.0)

## 2013-03-26 LAB — MICROALBUMIN / CREATININE URINE RATIO
Creatinine, Urine: 93.6 mg/dL
Microalb Creat Ratio: 6.1 mg/g (ref 0.0–30.0)
Microalb, Ur: 0.57 mg/dL (ref 0.00–1.89)

## 2013-03-26 LAB — VITAMIN D 25 HYDROXY (VIT D DEFICIENCY, FRACTURES): Vit D, 25-Hydroxy: 65 ng/mL (ref 30–89)

## 2013-05-26 ENCOUNTER — Other Ambulatory Visit: Payer: Self-pay | Admitting: Internal Medicine

## 2013-10-02 ENCOUNTER — Ambulatory Visit: Payer: Self-pay | Admitting: Internal Medicine

## 2013-10-03 ENCOUNTER — Encounter: Payer: Self-pay | Admitting: Internal Medicine

## 2013-10-03 ENCOUNTER — Ambulatory Visit (INDEPENDENT_AMBULATORY_CARE_PROVIDER_SITE_OTHER): Payer: Medicare Other | Admitting: Internal Medicine

## 2013-10-03 VITALS — BP 138/80 | HR 72 | Temp 97.9°F | Resp 16 | Ht 62.5 in | Wt 142.4 lb

## 2013-10-03 DIAGNOSIS — B029 Zoster without complications: Secondary | ICD-10-CM

## 2013-10-03 DIAGNOSIS — Z79899 Other long term (current) drug therapy: Secondary | ICD-10-CM | POA: Insufficient documentation

## 2013-10-03 MED ORDER — PREDNISONE 20 MG PO TABS: ORAL_TABLET | ORAL | Status: DC

## 2013-10-03 MED ORDER — HYDROCODONE-ACETAMINOPHEN 5-325 MG PO TABS: ORAL_TABLET | ORAL | Status: DC

## 2013-10-03 MED ORDER — VALACYCLOVIR HCL 1 G PO TABS: ORAL_TABLET | ORAL | Status: DC

## 2013-10-03 NOTE — Progress Notes (Deleted)
Patient ID: Michaela Kelley, female   DOB: April 18, 1940, 73 y.o.   MRN: [id number]

## 2013-10-03 NOTE — Progress Notes (Signed)
Subjective:    Patient ID: Michaela Kelley, female    DOB: [birth year], 73 y.o.   MRN: [id number]  HPI Patient presents with suspected Shingles in her perineal area Dx'd 2 days ago and treated with low dose Valtrex.    Medication List       This list is accurate as of: 10/03/13  5:22 PM.  Always use your most recent med list.               aspirin EC 81 MG tablet  Take 81 mg by mouth daily.     atenolol 50 MG tablet  Commonly known as:  TENORMIN  TAKE ONE TABLET BY MOUTH ONE TIME DAILY     b complex vitamins capsule  Take 1 capsule by mouth daily.     BENADRYL PO  Take by mouth as needed.     famotidine-calcium carbonate-magnesium hydroxide 10-800-165 MG Chew chewable tablet  Commonly known as:  PEPCID COMPLETE  Chew 1 tablet by mouth daily as needed.     HYDROcodone-acetaminophen 5-325 MG per tablet  Commonly known as:  NORCO  Take 1/2 to 1 tablet every 3 - 4 hours as needed for pain     MAGNESIUM PO  Take 1 tablet by mouth daily.     OVER THE COUNTER MEDICATION  daily. CURAMED     predniSONE 20 MG tablet  Commonly known as:  DELTASONE  1 tab 3 x day for 3 days, then 1 tab 2 x day for 3 days, then 1 tab 1 x day for 5 days     valACYclovir 1000 MG tablet  Commonly known as:  VALTREX  Take 1 tablet 3 x day     VITAMIN D PO  Take 2,000 Int'l Units by mouth daily.       Allergies  Allergen Reactions  . Codeine Nausea And Vomiting and Other (See Comments)    Severe headaches (tolerates Dilaudid)  . Hydrocodone-Acetaminophen Nausea And Vomiting and Other (See Comments)    Severe headaches (tolerates Dilaudid)  . Oxycodone-Acetaminophen Nausea And Vomiting and Other (See Comments)    Severe headaches (tolerates Dilaudid)  . Oxycodone-Aspirin Nausea And Vomiting and Other (See Comments)    Severe headaches (tolerates Dilaudid)   Past Medical History  Diagnosis Date  . Hypertension   . Hx: UTI (urinary tract infection)   . Seasonal allergic rhinitis   .  Arthritis   . Hyperlipidemia   . Prediabetes   . Vitamin D deficiency    Review of Systems In addition to the HPI above,  No Fever-chills,  No Headache, No changes with Vision or hearing,  No problems swallowing food or Liquids,  No Chest pain or productive Cough or Shortness of Breath,  No Abdominal pain, No Nausea or Vomitting, Bowel movements are regular,  No Blood in stool or Urine,  No dysuria,  No new skin rashes or bruises,  No new joints pains-aches,  No new weakness, tingling, numbness in any extremity,  No recent weight loss,  No polyuria, polydypsia or polyphagia,  No significant Mental Stressors.  A full 10 point Review of Systems was done, except as stated above, all other Review of Systems were negative  Objective:   Physical Exam  BP 138/80  Pulse 72  Temp(Src) 97.9 F (36.6 C) (Temporal)  Resp 16  Ht 5' 2.5" (1.588 m)  Wt 142 lb 6.4 oz (64.592 kg)  BMI 25.61 kg/m2  Exam focused on the pelvic/perineal area finds a  classic shingles type ras of multiple erythematous raised hive type lesions 3 to 8 mm in the Left S4/S5 dermatome. No vesicles ulcerations are noted.  Assessment & Plan:   1. Herpes zoster without mention of complication  - valACYclovir (VALTREX) 1000 MG tablet; Take 1 tablet 3 x day  Dispense: 30 tablet; Refill: 0 - predniSONE (DELTASONE) 20 MG tablet; 1 tab 3 x day for 3 days, then 1 tab 2 x day for 3 days, then 1 tab 1 x day for 5 days  Dispense: 20 tablet; Refill: 0 - HYDROcodone-acetaminophen (NORCO) 5-325 MG per tablet; Take 1/2 to 1 tablet every 3 - 4 hours as needed for pain  Dispense: 50 tablet; Refill: 0  - Discussed med effect/SE's  ROV - 2 weeks

## 2013-10-03 NOTE — Patient Instructions (Addendum)
Shingles °Shingles (herpes zoster) is an infection that is caused by the same virus that causes chickenpox (varicella). The infection causes a painful skin rash and fluid-filled blisters, which eventually break open, crust over, and heal. It may occur in any area of the body, but it usually affects only one side of the body or face. The pain of shingles usually lasts about 1 month. However, some people with shingles may develop long-term (chronic) pain in the affected area of the body. °Shingles often occurs many years after the person had chickenpox. It is more common: °· In people older than 50 years. °· In people with weakened immune systems, such as those with HIV, AIDS, or cancer. °· In people taking medicines that weaken the immune system, such as transplant medicines. °· In people under great stress. °CAUSES  °Shingles is caused by the varicella zoster virus (VZV), which also causes chickenpox. After a person is infected with the virus, it can remain in the person's body for years in an inactive state (dormant). To cause shingles, the virus reactivates and breaks out as an infection in a nerve root. °The virus can be spread from person to person (contagious) through contact with open blisters of the shingles rash. It will only spread to people who have not had chickenpox. When these people are exposed to the virus, they may develop chickenpox. They will not develop shingles. Once the blisters scab over, the person is no longer contagious and cannot spread the virus to others. °SYMPTOMS  °Shingles shows up in stages. The initial symptoms may be pain, itching, and tingling in an area of the skin. This pain is usually described as burning, stabbing, or throbbing. In a few days or weeks, a painful red rash will appear in the area where the pain, itching, and tingling were felt. The rash is usually on one side of the body in a band or belt-like pattern. Then, the rash usually turns into fluid-filled blisters. They  will scab over and dry up in approximately 2-3 weeks. °Flu-like symptoms may also occur with the initial symptoms, the rash, or the blisters. These may include: °· Fever. °· Chills. °· Headache. °· Upset stomach. °DIAGNOSIS  °Your caregiver will perform a skin exam to diagnose shingles. Skin scrapings or fluid samples may also be taken from the blisters. This sample will be examined under a microscope or sent to a lab for further testing. °TREATMENT  °There is no specific cure for shingles. Your caregiver will likely prescribe medicines to help you manage the pain, recover faster, and avoid long-term problems. This may include antiviral drugs, anti-inflammatory drugs, and pain medicines. °HOME CARE INSTRUCTIONS  °· Take a cool bath or apply cool compresses to the area of the rash or blisters as directed. This may help with the pain and itching.   °· Only take over-the-counter or prescription medicines as directed by your caregiver.   °· Rest as directed by your caregiver. °· Keep your rash and blisters clean with mild soap and cool water or as directed by your caregiver.  °· Do not pick your blisters or scratch your rash. Apply an anti-itch cream or numbing creams to the affected area as directed by your caregiver. °· Keep your shingles rash covered with a loose bandage (dressing). °· Avoid skin contact with: °¨ Babies.   °¨ Pregnant women.   °¨ Children with eczema.   °¨ Elderly people with transplants.   °¨ People with chronic illnesses, such as leukemia or AIDS.   °· Wear loose-fitting clothing to help ease the   pain of material rubbing against the rash. °· Keep all follow-up appointments with your caregiver. If the area involved is on your face, you may receive a referral for follow-up to a specialist, such as an eye doctor (ophthalmologist) or an ear, nose, and throat (ENT) doctor. Keeping all follow-up appointments will help you avoid eye complications, chronic pain, or disability.   °SEEK IMMEDIATE MEDICAL  CARE IF:  °· You have facial pain, pain around the eye area, or loss of feeling on one side of your face. °· You have ear pain or ringing in your ear. °· You have loss of taste. °· Your pain is not relieved with prescribed medicines.   °· Your redness or swelling spreads.   °· You have more pain and swelling.  °· Your condition is worsening or has changed.   °· You have a fever or persistent symptoms for more than 2-3 days. °· You have a fever and your symptoms suddenly get worse. °MAKE SURE YOU: °· Understand these instructions. °· Will watch your condition. °· Will get help right away if you are not doing well or get worse. °Document Released: 03/14/2005 Document Revised: 12/07/2011 Document Reviewed: 10/27/2011 °[employer]® Patient Information ©2015 [employer]. This information is not intended to replace advice given to you by your health care provider. Make sure you discuss any questions you have with your health care provider. ° °

## 2013-10-23 ENCOUNTER — Ambulatory Visit (INDEPENDENT_AMBULATORY_CARE_PROVIDER_SITE_OTHER): Payer: Medicare Other | Admitting: Internal Medicine

## 2013-10-23 ENCOUNTER — Encounter: Payer: Self-pay | Admitting: Internal Medicine

## 2013-10-23 VITALS — BP 134/64 | HR 66 | Temp 98.2°F | Resp 18 | Ht 62.75 in | Wt 142.0 lb

## 2013-10-23 DIAGNOSIS — R7303 Prediabetes: Secondary | ICD-10-CM

## 2013-10-23 DIAGNOSIS — E559 Vitamin D deficiency, unspecified: Secondary | ICD-10-CM

## 2013-10-23 DIAGNOSIS — Z79899 Other long term (current) drug therapy: Secondary | ICD-10-CM

## 2013-10-23 DIAGNOSIS — E785 Hyperlipidemia, unspecified: Secondary | ICD-10-CM

## 2013-10-23 DIAGNOSIS — I10 Essential (primary) hypertension: Secondary | ICD-10-CM

## 2013-10-23 DIAGNOSIS — R7309 Other abnormal glucose: Secondary | ICD-10-CM

## 2013-10-23 LAB — BASIC METABOLIC PANEL WITH GFR
BUN: 11 mg/dL (ref 6–23)
CALCIUM: 9.8 mg/dL (ref 8.4–10.5)
CO2: 25 mEq/L (ref 19–32)
Chloride: 99 mEq/L (ref 96–112)
Creat: 0.75 mg/dL (ref 0.50–1.10)
GFR, Est Non African American: 80 mL/min
Glucose, Bld: 102 mg/dL — ABNORMAL HIGH (ref 70–99)
POTASSIUM: 4.1 meq/L (ref 3.5–5.3)
SODIUM: 134 meq/L — AB (ref 135–145)

## 2013-10-23 LAB — HEPATIC FUNCTION PANEL
ALT: 26 U/L (ref 0–35)
AST: 23 U/L (ref 0–37)
Albumin: 4.5 g/dL (ref 3.5–5.2)
Alkaline Phosphatase: 56 U/L (ref 39–117)
BILIRUBIN DIRECT: 0.1 mg/dL (ref 0.0–0.3)
Indirect Bilirubin: 0.7 mg/dL (ref 0.2–1.2)
TOTAL PROTEIN: 7.1 g/dL (ref 6.0–8.3)
Total Bilirubin: 0.8 mg/dL (ref 0.2–1.2)

## 2013-10-23 LAB — CBC WITH DIFFERENTIAL/PLATELET
Basophils Absolute: 0 10*3/uL (ref 0.0–0.1)
Basophils Relative: 0 % (ref 0–1)
EOS ABS: 0.1 10*3/uL (ref 0.0–0.7)
Eosinophils Relative: 2 % (ref 0–5)
HCT: 36.7 % (ref 36.0–46.0)
Hemoglobin: 12.5 g/dL (ref 12.0–15.0)
LYMPHS ABS: 2 10*3/uL (ref 0.7–4.0)
Lymphocytes Relative: 30 % (ref 12–46)
MCH: 31 pg (ref 26.0–34.0)
MCHC: 34.1 g/dL (ref 30.0–36.0)
MCV: 91.1 fL (ref 78.0–100.0)
Monocytes Absolute: 0.6 10*3/uL (ref 0.1–1.0)
Monocytes Relative: 9 % (ref 3–12)
NEUTROS ABS: 4 10*3/uL (ref 1.7–7.7)
NEUTROS PCT: 59 % (ref 43–77)
Platelets: 249 10*3/uL (ref 150–400)
RBC: 4.03 MIL/uL (ref 3.87–5.11)
RDW: 14.5 % (ref 11.5–15.5)
WBC: 6.8 10*3/uL (ref 4.0–10.5)

## 2013-10-23 LAB — LIPID PANEL
Cholesterol: 250 mg/dL — ABNORMAL HIGH (ref 0–200)
HDL: 80 mg/dL (ref >39)
LDL Cholesterol: 110 mg/dL — ABNORMAL HIGH (ref 0–99)
Total CHOL/HDL Ratio: 3.1 Ratio
Triglycerides: 302 mg/dL — ABNORMAL HIGH (ref <150)
VLDL: 60 mg/dL — ABNORMAL HIGH (ref 0–40)

## 2013-10-23 LAB — MAGNESIUM: Magnesium: 2 mg/dL (ref 1.5–2.5)

## 2013-10-23 LAB — HEMOGLOBIN A1C
HEMOGLOBIN A1C: 5.6 % (ref <5.7)
MEAN PLASMA GLUCOSE: 114 mg/dL (ref <117)

## 2013-10-23 LAB — TSH: TSH: 1.96 u[IU]/mL (ref 0.350–4.500)

## 2013-10-23 MED ORDER — HYDROXYZINE HCL 25 MG PO TABS: ORAL_TABLET | ORAL | Status: DC

## 2013-10-23 NOTE — Progress Notes (Signed)
Patient ID: Michaela Kelley, female   DOB: 03/14/1941, 73 y.o.   MRN: [id number]   This very nice 73 y.o.WWF presents for 3 month follow up with Hypertension, Hyperlipidemia, Pre-Diabetes and Vitamin D Deficiency and also 2 week f/u of shingles in the perineal area treated with P{rednisone pulse/Zovirax and prn Norco. Patient reports this is much better- the rash essentially cleared, but she has periods of intense pruritis nocturnally.     Patient is treated for HTN & BP has been controlled at home. Today's BP: 134/64 mmHg. Patient denies any cardiac type chest pain, palpitations, dyspnea/orthopnea/PND, dizziness, claudication, or dependent edema.   Hyperlipidemia is controlled with diet & meds. Patient denies myalgias or other med SE's. Last Lipids were 03/25/2013: Cholesterol, Total 208*; HDL Cholesterol by NMR 85; Triglycerides 163*; VLDL 33   Also, the patient has history of PreDiabetes and last A1c was 03/25/2013: Hemoglobin-A1c 5.3  Patient denies any symptoms of reactive hypoglycemia, diabetic polys, paresthesias or visual blurring.   Further, Patient has history of Vitamin D Deficiency and last vitamin D was 32 in Dec 2014.Patient supplements vitamin D without any suspected side-effects.   Medication List   amoxicillin 500 MG tablet  Commonly known as:  AMOXIL  Take 500 mg by mouth 2 (two) times daily.     aspirin EC 81 MG tablet  Take 81 mg by mouth daily.     atenolol 50 MG tablet  Commonly known as:  TENORMIN  TAKE ONE TABLET BY MOUTH ONE TIME DAILY     b complex vitamins capsule  Take 1 capsule by mouth daily.     BENADRYL PO  Take by mouth as needed.     famotidine-calcium carbonate-magnesium hydroxide 10-800-165 MG Chew chewable tablet  Commonly known as:  PEPCID COMPLETE  Chew 1 tablet by mouth daily as needed.     HYDROcodone-acetaminophen 5-325 MG per tablet  Commonly known as:  NORCO  Take 1/2 to 1 tablet every 3 - 4 hours as needed for pain     hydrOXYzine 25 MG  tablet  Commonly known as:  ATARAX/VISTARIL  Take 1 to 4 tablets daily as needed for severe itch     lidocaine-prilocaine cream  Commonly known as:  EMLA  Apply 1 application topically as needed.     MAGNESIUM PO  Take 1 tablet by mouth daily.     OVER THE COUNTER MEDICATION  daily. CURAMED     VITAMIN D PO  Take 2,000 Int'l Units by mouth daily.     Allergies  Allergen Reactions  . Codeine Nausea And Vomiting and Other (See Comments)    Severe headaches (tolerates Dilaudid)  . Hydrocodone-Acetaminophen Nausea And Vomiting and Other (See Comments)    Severe headaches (tolerates Dilaudid)  . Oxycodone-Acetaminophen Nausea And Vomiting and Other (See Comments)    Severe headaches (tolerates Dilaudid)  . Oxycodone-Aspirin Nausea And Vomiting and Other (See Comments)    Severe headaches (tolerates Dilaudid)   PMHx:   Past Medical History  Diagnosis Date  . Hypertension   . Hx: UTI (urinary tract infection)   . Seasonal allergic rhinitis   . Arthritis   . Hyperlipidemia   . Prediabetes   . Vitamin D deficiency    FHx:    Reviewed / unchanged SHx:    Reviewed / unchanged  Systems Review:  Constitutional: Denies fever, chills, wt changes, headaches, insomnia, fatigue, night sweats, change in appetite. Eyes: Denies redness, blurred vision, diplopia, discharge, itchy, watery eyes.  ENT: Denies  discharge, congestion, post nasal drip, epistaxis, sore throat, earache, hearing loss, dental pain, tinnitus, vertigo, sinus pain, snoring.  CV: Denies chest pain, palpitations, irregular heartbeat, syncope, dyspnea, diaphoresis, orthopnea, PND, claudication or edema. Respiratory: denies cough, dyspnea, DOE, pleurisy, hoarseness, laryngitis, wheezing.  Gastrointestinal: Denies dysphagia, odynophagia, heartburn, reflux, water brash, abdominal pain or cramps, nausea, vomiting, bloating, diarrhea, constipation, hematemesis, melena, hematochezia  or hemorrhoids. Genitourinary: Denies  dysuria, frequency, urgency, nocturia, hesitancy, discharge, hematuria or flank pain. Musculoskeletal: Denies arthralgias, myalgias, stiffness, jt. swelling, pain, limping or strain/sprain.  Skin: Denies pruritus, rash, hives, warts, acne, eczema or change in skin lesion(s). Neuro: No weakness, tremor, incoordination, spasms, paresthesia or pain. Psychiatric: Denies confusion, memory loss or sensory loss. Endo: Denies change in weight, skin or hair change.  Heme/Lymph: No excessive bleeding, bruising or enlarged lymph nodes.  Exam:  BP 134/64  Pulse 66  Temp(Src) 98.2 F (36.8 C) (Temporal)  Resp 18  Ht 5' 2.75" (1.594 m)  Wt 142 lb (64.411 kg)  BMI 25.35 kg/m2  Appears well nourished and in no distress. Eyes: PERRLA, EOMs, conjunctiva no swelling or erythema. Sinuses: No frontal/maxillary tenderness ENT/Mouth: EAC's clear, TM's nl w/o erythema, bulging. Nares clear w/o erythema, swelling, exudates. Oropharynx clear without erythema or exudates. Oral hygiene is good. Tongue normal, non obstructing. Hearing intact.  Neck: Supple. Thyroid nl. Car 2+/2+ without bruits, nodes or JVD. Chest: Respirations nl with BS clear & equal w/o rales, rhonchi, wheezing or stridor.  Cor: Heart sounds normal w/ regular rate and rhythm without sig. murmurs, gallops, clicks, or rubs. Peripheral pulses normal and equal  without edema.  Abdomen: Soft & bowel sounds normal. Non-tender w/o guarding, rebound, hernias, masses, or organomegaly.  Lymphatics: Unremarkable.  Musculoskeletal: Full ROM all peripheral extremities, joint stability, 5/5 strength, and normal gait.  Skin: Warm, dry without exposed rashes, lesions or ecchymosis apparent.  Neuro: Cranial nerves intact, reflexes equal bilaterally. Sensory-motor testing grossly intact. Tendon reflexes grossly intact.  Pysch: Alert & oriented x 3. Insight and judgement nl & appropriate. No ideations.  Assessment and Plan:  1. Hypertension - Continue monitor  blood pressure at home. Continue diet/meds same.  2. Hyperlipidemia - Continue diet/meds, exercise,& lifestyle modifications. Continue monitor periodic cholesterol/liver & renal functions   3. Pre-Diabetes - Continue diet, exercise, lifestyle modifications. Monitor appropriate labs.  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 Vitamin D Deficiency - Continue supplementation.  5. Shingles, resolving with residual neuralgia(pruritis) - Recc trial on Hydroxyzine for the pruritis.  Recommended regular exercise, BP monitoring, weight control, and discussed med and SE's. Recommended labs to assess and monitor clinical status. Further disposition pending results of labs.

## 2013-10-23 NOTE — Patient Instructions (Signed)

## 2013-10-24 LAB — INSULIN, FASTING: INSULIN FASTING, SERUM: 15 u[IU]/mL (ref 3–28)

## 2013-10-24 LAB — VITAMIN D 25 HYDROXY (VIT D DEFICIENCY, FRACTURES): Vit D, 25-Hydroxy: 68 ng/mL (ref 30–89)

## 2013-10-28 ENCOUNTER — Telehealth: Payer: Self-pay | Admitting: *Deleted

## 2013-10-28 ENCOUNTER — Other Ambulatory Visit: Payer: Self-pay | Admitting: Internal Medicine

## 2013-10-28 DIAGNOSIS — E782 Mixed hyperlipidemia: Secondary | ICD-10-CM

## 2013-10-28 MED ORDER — ATORVASTATIN CALCIUM 80 MG PO TABS: 80.0000 mg | ORAL_TABLET | Freq: Every day | ORAL | Status: DC

## 2013-10-28 NOTE — Telephone Encounter (Signed)
Called patient to inform her to start then Lipitor 80 mg at 1/2 tab on T,TH, and SA

## 2013-12-03 ENCOUNTER — Other Ambulatory Visit: Payer: Self-pay | Admitting: Internal Medicine

## 2014-01-09 ENCOUNTER — Other Ambulatory Visit: Payer: Self-pay

## 2014-01-09 DIAGNOSIS — Z1231 Encounter for screening mammogram for malignant neoplasm of breast: Secondary | ICD-10-CM

## 2014-01-28 ENCOUNTER — Ambulatory Visit
Admission: RE | Admit: 2014-01-28 | Discharge: 2014-01-28 | Disposition: A | Discharge status: Home / Self Care | Payer: Medicare Other | Source: Ambulatory Visit

## 2014-01-28 ENCOUNTER — Ambulatory Visit
Admission: RE | Admit: 2014-01-28 | Discharge: 2014-01-28 | Disposition: A | Discharge status: Home / Self Care | Payer: Medicare Other | Source: Ambulatory Visit | Attending: Physician Assistant | Admitting: Physician Assistant

## 2014-01-28 DIAGNOSIS — M810 Age-related osteoporosis without current pathological fracture: Secondary | ICD-10-CM

## 2014-01-28 DIAGNOSIS — Z1231 Encounter for screening mammogram for malignant neoplasm of breast: Secondary | ICD-10-CM

## 2014-03-22 ENCOUNTER — Other Ambulatory Visit: Payer: Self-pay | Admitting: Internal Medicine

## 2014-03-31 ENCOUNTER — Encounter: Payer: Self-pay | Admitting: Physician Assistant

## 2014-05-05 ENCOUNTER — Encounter: Payer: Self-pay | Admitting: Physician Assistant

## 2014-05-05 ENCOUNTER — Ambulatory Visit (INDEPENDENT_AMBULATORY_CARE_PROVIDER_SITE_OTHER): Payer: Medicare Other | Admitting: Physician Assistant

## 2014-05-05 VITALS — BP 138/78 | HR 60 | Temp 97.7°F | Resp 16 | Ht 62.5 in | Wt 172.0 lb

## 2014-05-05 DIAGNOSIS — Z23 Encounter for immunization: Secondary | ICD-10-CM

## 2014-05-05 DIAGNOSIS — Z79899 Other long term (current) drug therapy: Secondary | ICD-10-CM

## 2014-05-05 DIAGNOSIS — J302 Other seasonal allergic rhinitis: Secondary | ICD-10-CM

## 2014-05-05 DIAGNOSIS — M19019 Primary osteoarthritis, unspecified shoulder: Secondary | ICD-10-CM

## 2014-05-05 DIAGNOSIS — I10 Essential (primary) hypertension: Secondary | ICD-10-CM

## 2014-05-05 DIAGNOSIS — Z789 Other specified health status: Secondary | ICD-10-CM

## 2014-05-05 DIAGNOSIS — Z1331 Encounter for screening for depression: Secondary | ICD-10-CM

## 2014-05-05 DIAGNOSIS — R9389 Abnormal findings on diagnostic imaging of other specified body structures: Secondary | ICD-10-CM

## 2014-05-05 DIAGNOSIS — Z8619 Personal history of other infectious and parasitic diseases: Secondary | ICD-10-CM

## 2014-05-05 DIAGNOSIS — E559 Vitamin D deficiency, unspecified: Secondary | ICD-10-CM

## 2014-05-05 DIAGNOSIS — R7303 Prediabetes: Secondary | ICD-10-CM

## 2014-05-05 DIAGNOSIS — R6889 Other general symptoms and signs: Secondary | ICD-10-CM

## 2014-05-05 DIAGNOSIS — Z0001 Encounter for general adult medical examination with abnormal findings: Secondary | ICD-10-CM

## 2014-05-05 DIAGNOSIS — M1990 Unspecified osteoarthritis, unspecified site: Secondary | ICD-10-CM

## 2014-05-05 DIAGNOSIS — E785 Hyperlipidemia, unspecified: Secondary | ICD-10-CM

## 2014-05-05 LAB — BASIC METABOLIC PANEL WITH GFR
BUN: 9 mg/dL (ref 6–23)
CO2: 24 meq/L (ref 19–32)
CREATININE: 0.8 mg/dL (ref 0.50–1.10)
Calcium: 9.6 mg/dL (ref 8.4–10.5)
Chloride: 100 mEq/L (ref 96–112)
GFR, EST AFRICAN AMERICAN: 85 mL/min
GFR, Est Non African American: 74 mL/min
Glucose, Bld: 93 mg/dL (ref 70–99)
Potassium: 4.2 mEq/L (ref 3.5–5.3)
Sodium: 136 mEq/L (ref 135–145)

## 2014-05-05 LAB — LIPID PANEL
CHOLESTEROL: 167 mg/dL (ref 0–200)
HDL: 76 mg/dL (ref >39)
LDL Cholesterol: 52 mg/dL (ref 0–99)
TRIGLYCERIDES: 195 mg/dL — AB (ref <150)
Total CHOL/HDL Ratio: 2.2 Ratio
VLDL: 39 mg/dL (ref 0–40)

## 2014-05-05 LAB — HEPATIC FUNCTION PANEL
ALBUMIN: 4.4 g/dL (ref 3.5–5.2)
ALT: 31 U/L (ref 0–35)
AST: 24 U/L (ref 0–37)
Alkaline Phosphatase: 58 U/L (ref 39–117)
BILIRUBIN DIRECT: 0.2 mg/dL (ref 0.0–0.3)
Indirect Bilirubin: 0.7 mg/dL (ref 0.2–1.2)
Total Bilirubin: 0.9 mg/dL (ref 0.2–1.2)
Total Protein: 6.8 g/dL (ref 6.0–8.3)

## 2014-05-05 LAB — CBC WITH DIFFERENTIAL/PLATELET
Basophils Absolute: 0.1 10*3/uL (ref 0.0–0.1)
Basophils Relative: 1 % (ref 0–1)
EOS PCT: 2 % (ref 0–5)
Eosinophils Absolute: 0.1 10*3/uL (ref 0.0–0.7)
HCT: 37.3 % (ref 36.0–46.0)
Hemoglobin: 12.6 g/dL (ref 12.0–15.0)
Lymphocytes Relative: 39 % (ref 12–46)
Lymphs Abs: 2.4 10*3/uL (ref 0.7–4.0)
MCH: 30.6 pg (ref 26.0–34.0)
MCHC: 33.8 g/dL (ref 30.0–36.0)
MCV: 90.5 fL (ref 78.0–100.0)
MONOS PCT: 8 % (ref 3–12)
MPV: 8.4 fL — AB (ref 8.6–12.4)
Monocytes Absolute: 0.5 10*3/uL (ref 0.1–1.0)
NEUTROS ABS: 3.1 10*3/uL (ref 1.7–7.7)
Neutrophils Relative %: 50 % (ref 43–77)
PLATELETS: 325 10*3/uL (ref 150–400)
RBC: 4.12 MIL/uL (ref 3.87–5.11)
RDW: 13.3 % (ref 11.5–15.5)
WBC: 6.2 10*3/uL (ref 4.0–10.5)

## 2014-05-05 LAB — HEMOGLOBIN A1C
Hgb A1c MFr Bld: 5.4 % (ref <5.7)
Mean Plasma Glucose: 108 mg/dL (ref <117)

## 2014-05-05 LAB — TSH: TSH: 1.807 u[IU]/mL (ref 0.350–4.500)

## 2014-05-05 LAB — MAGNESIUM: MAGNESIUM: 1.9 mg/dL (ref 1.5–2.5)

## 2014-05-05 NOTE — Patient Instructions (Addendum)
Please pick one of the over the counter allergy medications below and take it once daily for allergies.  Claritin or loratadine cheapest but likely the weakest  Zyrtec or certizine at night because it can make you sleepy The strongest is allegra or fexafinadine  Cheapest at [facility], [employer], [employer]  Can take lipitor 1/2 a pill 2 days a week.   Preventative Care for Adults - Female      MAINTAIN REGULAR HEALTH EXAMS:  A routine yearly physical is a good way to check in with your primary care provider about your health and preventive screening. It is also an opportunity to share updates about your health and any concerns you have, and receive a thorough all-over exam.   Most health insurance companies pay for at least some preventative services.  Check with your health plan for specific coverages.  WHAT PREVENTATIVE SERVICES DO WOMEN NEED?  Adult women should have their weight and blood pressure checked regularly.   Women age 40 and older should have their cholesterol levels checked regularly.  Women should be screened for cervical cancer with a Pap smear and pelvic exam beginning at either age 38, or 3 years after they become sexually activity.    Breast cancer screening generally begins at age 44 with a mammogram and breast exam by your primary care provider.    Beginning at age 54 and continuing to age 76, women should be screened for colorectal cancer.  Certain people may need continued testing until age 8.  Updating vaccinations is part of preventative care.  Vaccinations help protect against diseases such as the flu.  Osteoporosis is a disease in which the bones lose minerals and strength as we age. Women ages 32 and over should discuss this with their caregivers, as should women after menopause who have other risk factors.  Lab tests are generally done as part of preventative care to screen for anemia and blood disorders, to screen for problems with the kidneys and liver, to  screen for bladder problems, to check blood sugar, and to check your cholesterol level.  Preventative services generally include counseling about diet, exercise, avoiding tobacco, drugs, excessive alcohol consumption, and sexually transmitted infections.    GENERAL RECOMMENDATIONS FOR GOOD HEALTH:  Healthy diet:  Eat a variety of foods, including fruit, vegetables, animal or vegetable protein, such as meat, fish, chicken, and eggs, or beans, lentils, tofu, and grains, such as rice.  Drink plenty of water daily.  Decrease saturated fat in the diet, avoid lots of red meat, processed foods, sweets, fast foods, and fried foods.  Exercise:  Aerobic exercise helps maintain good heart health. At least 30-40 minutes of moderate-intensity exercise is recommended. For example, a brisk walk that increases your heart rate and breathing. This should be done on most days of the week.   Find a type of exercise or a variety of exercises that you enjoy so that it becomes a part of your daily life.  Examples are running, walking, swimming, water aerobics, and biking.  For motivation and support, explore group exercise such as aerobic class, spin class, Zumba, Yoga,or  martial arts, etc.    Set exercise goals for yourself, such as a certain weight goal, walk or run in a race such as a 5k walk/run.  Speak to your primary care provider about exercise goals.  Disease prevention:  If you smoke or chew tobacco, find out from your caregiver how to quit. It can literally save your life, no matter how long  you have been a tobacco user. If you do not use tobacco, never begin.   Maintain a healthy diet and normal weight. Increased weight leads to problems with blood pressure and diabetes.   The Body Mass Index or BMI is a way of measuring how much of your body is fat. Having a BMI above 27 increases the risk of heart disease, diabetes, hypertension, stroke and other problems related to obesity. Your caregiver can  help determine your BMI and based on it develop an exercise and dietary program to help you achieve or maintain this important measurement at a healthful level.  High blood pressure causes heart and blood vessel problems.  Persistent high blood pressure should be treated with medicine if weight loss and exercise do not work.   Fat and cholesterol leaves deposits in your arteries that can block them. This causes heart disease and vessel disease elsewhere in your body.  If your cholesterol is found to be high, or if you have heart disease or certain other medical conditions, then you may need to have your cholesterol monitored frequently and be treated with medication.   Ask if you should have a cardiac stress test if your history suggests this. A stress test is a test done on a treadmill that looks for heart disease. This test can find disease prior to there being a problem.  Menopause can be associated with physical symptoms and risks. Hormone replacement therapy is available to decrease these. You should talk to your caregiver about whether starting or continuing to take hormones is right for you.   Osteoporosis is a disease in which the bones lose minerals and strength as we age. This can result in serious bone fractures. Risk of osteoporosis can be identified using a bone density scan. Women ages 21 and over should discuss this with their caregivers, as should women after menopause who have other risk factors. Ask your caregiver whether you should be taking a calcium supplement and Vitamin D, to reduce the rate of osteoporosis.   Avoid drinking alcohol in excess (more than two drinks per day).  Avoid use of street drugs. Do not share needles with anyone. Ask for professional help if you need assistance or instructions on stopping the use of alcohol, cigarettes, and/or drugs.  Brush your teeth twice a day with fluoride toothpaste, and floss once a day. Good oral hygiene prevents tooth decay and gum  disease. The problems can be painful, unattractive, and can cause other health problems. Visit your dentist for a routine oral and dental check up and preventive care every 6-12 months.   Look at your skin regularly.  Use a mirror to look at your back. Notify your caregivers of changes in moles, especially if there are changes in shapes, colors, a size larger than a pencil eraser, an irregular border, or development of new moles.  Safety:  Use seatbelts 100% of the time, whether driving or as a passenger.  Use safety devices such as hearing protection if you work in environments with loud noise or significant background noise.  Use safety glasses when doing any work that could send debris in to the eyes.  Use a helmet if you ride a bike or motorcycle.  Use appropriate safety gear for contact sports.  Talk to your caregiver about gun safety.  Use sunscreen with a SPF (or skin protection factor) of 15 or greater.  Lighter skinned people are at a greater risk of skin cancer. Don't forget to also wear sunglasses  in order to protect your eyes from too much damaging sunlight. Damaging sunlight can accelerate cataract formation.   Practice safe sex. Use condoms. Condoms are used for birth control and to help reduce the spread of sexually transmitted infections (or STIs).  Some of the STIs are gonorrhea (the clap), chlamydia, syphilis, trichomonas, herpes, HPV (human papilloma virus) and HIV (human immunodeficiency virus) which causes AIDS. The herpes, HIV and HPV are viral illnesses that have no cure. These can result in disability, cancer and death.   Keep carbon monoxide and smoke detectors in your home functioning at all times. Change the batteries every 6 months or use a model that plugs into the wall.   Vaccinations:  Stay up to date with your tetanus shots and other required immunizations. You should have a booster for tetanus every 10 years. Be sure to get your flu shot every year, since 5%-20% of  the [place] population comes down with the flu. The flu vaccine changes each year, so being vaccinated once is not enough. Get your shot in the fall, before the flu season peaks.   Other vaccines to consider:  Human Papilloma Virus or HPV causes cancer of the cervix, and other infections that can be transmitted from person to person. There is a vaccine for HPV, and females should get immunized between the ages of 33 and 47. It requires a series of 3 shots.   Pneumococcal vaccine to protect against certain types of pneumonia.  This is normally recommended for adults age 76 or older.  However, adults younger than 73 years old with certain underlying conditions such as diabetes, heart or lung disease should also receive the vaccine.  Shingles vaccine to protect against Varicella Zoster if you are older than age 73, or younger than 73 years old with certain underlying illness.  Hepatitis A vaccine to protect against a form of infection of the liver by a virus acquired from food.  Hepatitis B vaccine to protect against a form of infection of the liver by a virus acquired from blood or body fluids, particularly if you work in health care.  If you plan to travel internationally, check with your local health department for specific vaccination recommendations.  Cancer Screening:  Breast cancer screening is essential to preventive care for women. All women age 5 and older should perform a breast self-exam every month. At age 60 and older, women should have their caregiver complete a breast exam each year. Women at ages 2 and older should have a mammogram (x-ray film) of the breasts. Your caregiver can discuss how often you need mammograms.    Cervical cancer screening includes taking a Pap smear (sample of cells examined under a microscope) from the cervix (end of the uterus). It also includes testing for HPV (Human Papilloma Virus, which can cause cervical cancer). Screening and a pelvic exam should begin  at age 52, or 3 years after a woman becomes sexually active. Screening should occur every year, with a Pap smear but no HPV testing, up to age 59. After age 25, you should have a Pap smear every 3 years with HPV testing, if no HPV was found previously.   Most routine colon cancer screening begins at the age of 76. On a yearly basis, doctors may provide special easy to use take-home tests to check for hidden blood in the stool. Sigmoidoscopy or colonoscopy can detect the earliest forms of colon cancer and is life saving. These tests use a small camera at the  end of a tube to directly examine the colon. Speak to your caregiver about this at age 77, when routine screening begins (and is repeated every 5 years unless early forms of pre-cancerous polyps or small growths are found).

## 2014-05-05 NOTE — Progress Notes (Signed)
MEDICARE ANNUAL WELLNESS VISIT AND CPE  Assessment:   1. Essential hypertension Sinus bradycardia with some AV nodal dysfuntion, will cut atenolol in half and possibly stop/switch to something else. Will monitor BP at home, call if greater than 140/90 - CBC with Differential/Platelet - BASIC METABOLIC PANEL WITH GFR - Hepatic function panel - TSH - Urinalysis, Routine w reflex microscopic - Microalbumin / creatinine urine ratio - EKG 12-Lead - [place], RETROPERITNL ABD,  LTD  2. Prediabetes Discussed general issues about diabetes pathophysiology and management., Educational material distributed., Suggested low cholesterol diet., Encouraged aerobic exercise., Discussed foot care., Reminded to get yearly retinal exam. - Hemoglobin A1c - Insulin, fasting - HM DIABETES FOOT EXAM  3. Hyperlipidemia -continue medications, check lipids, decrease fatty foods, increase activity.  - Lipid panel  4. Vitamin D deficiency - Vit D  25 hydroxy (rtn osteoporosis monitoring)  5. Medication management - Magnesium  6. Shoulder arthritis remission  7. Abnormal chest x-ray Normal CT scan, will continue to monitor  8. Arthritis Continue OTC meds  9. Seasonal allergic rhinitis Allergic rhinitis- Allegra OTC, increase H20, allergy hygiene explained.  10. Need for prophylactic vaccination against Streptococcus pneumoniae (pneumococcus) - Pneumococcal conjugate vaccine 13-valent IM  11. History of shingles Will check titer level to see if would benefit from the shot - Varicella zoster antibody, IgG  12. Patient had no falls in past year Low fall risk  13. Depression screening NEGATIVE  14. Encounter for general adult medical examination with abnormal findings   Plan:   During the course of the visit the patient was educated and counseled about appropriate screening and preventive services including:    Pneumococcal vaccine   Influenza vaccine  Td vaccine  Screening  electrocardiogram  Bone densitometry screening  Colorectal cancer screening  Diabetes screening  Glaucoma screening  Nutrition counseling   Advanced directives: requested  Screening recommendations, referrals: Vaccinations: Please see documentation below and orders this visit.  Nutrition assessed and recommended  Colonoscopy due 2018 Recommended yearly ophthalmology/optometry visit for glaucoma screening and checkup Recommended yearly dental visit for hygiene and checkup Advanced directives - requested  Conditions/risks identified: BMI: Discussed weight loss, diet, and increase physical activity.  Increase physical activity: AHA recommends 150 minutes of physical activity a week.  Medications reviewed Diabetes is at goal, ACE/ARB therapy: No, Reason not on Ace Inhibitor/ARB therapy:  PreDM Urinary Incontinence is not an issue: discussed non pharmacology and pharmacology options.  Fall risk: low- discussed PT, home fall assessment, medications.    Subjective:  Michaela Kelley is a 74 y.o. female who presents for Medicare Annual Wellness Visit and complete physical.  Date of last medicare wellness visit is unknown.   Her blood pressure has been controlled at home, today their BP is BP: 138/78 mmHg She does workout, yoga and water exercise and golf during the summer. She denies chest pain, shortness of breath, dizziness.  She is on cholesterol medication, lipitor 80 1/2 pill 2-3 times a week, x 2-3 months and denies myalgias. Her cholesterol is not at goal. The cholesterol last visit was:   Lab Results  Component Value Date   CHOL 250* 10/23/2013   HDL 80 10/23/2013   LDLCALC 110* 10/23/2013   TRIG 302* 10/23/2013   CHOLHDL 3.1 10/23/2013   She has been working on diet and exercise for prediabetes, and denies paresthesia of the feet, polydipsia, polyuria and visual disturbances. Last A1C in the office was:  Lab Results  Component Value Date   HGBA1C 5.6  10/23/2013  Patient is  on Vitamin D supplement.   Lab Results  Component Value Date   VD25OH 68 10/23/2013  She has been congested for 2-3 days, she is not on an allergy pill, clear mucus, cough, no fever/chills.    Occ heart burn will take tums.    Names of Other Physician/Practitioners you currently use: 1. [facility] here for primary care 2. Dr. Celene Squibb, eye doctor, last visit 6 months, cataract sx bilateral 3. Dr. Edsel Petrin, dentist, last visit 2 weeks ago had root canal 4. Dr. Sharlett Iles, GI 5. [facility] 6. Dr. Marguerite Olea, derm Patient Care Team: Unk Pinto, MD as PCP - General (Internal Medicine)  Medication Review: Current Outpatient Prescriptions on File Prior to Visit  Medication Sig Dispense Refill  . aspirin EC 81 MG tablet Take 81 mg by mouth daily.      Marland Kitchen atenolol (TENORMIN) 50 MG tablet TAKE 1 TABLET BY MOUTH DAILY 90 tablet 0  . atorvastatin (LIPITOR) 80 MG tablet Take 1 tablet (80 mg total) by mouth daily. 30 tablet 11  . b complex vitamins capsule Take 1 capsule by mouth daily.    . Cholecalciferol (VITAMIN D PO) Take 2,000 Int'l Units by mouth daily.    . DiphenhydrAMINE HCl (BENADRYL PO) Take by mouth as needed.    . hydrOXYzine (ATARAX/VISTARIL) 25 MG tablet Take 1 to 4 tablets daily as needed for severe itch 100 tablet 0  . MAGNESIUM PO Take 250 mg by mouth daily.     Marland Kitchen OVER THE COUNTER MEDICATION daily. CURAMED     No current facility-administered medications on file prior to visit.    Current Problems (verified) Patient Active Problem List   Diagnosis Date Noted  . Medication management 10/03/2013  . Seasonal allergic rhinitis   . Arthritis   . Prediabetes   . Vitamin D deficiency   . Shoulder arthritis 12/23/2011  . Hyperlipidemia 01/02/2008  . Essential hypertension 01/02/2008  . Abnormal chest x-ray 01/02/2008    Screening Tests Health Maintenance  Topic Date Due  . ZOSTAVAX  06/09/2001  . COLONOSCOPY   03/28/2008  . INFLUENZA VACCINE  10/26/2013  . MAMMOGRAM  01/29/2016  . TETANUS/TDAP  05/26/2021  . DEXA SCAN  Completed  . PNEUMOCOCCAL POLYSACCHARIDE VACCINE AGE 76 AND OVER  Completed    Immunization History  Administered Date(s) Administered  . Pneumococcal Polysaccharide-23 06/28/2006  . Td 05/27/2011   Preventative care: Tetanus: 2013 Pneumovax: 2008 Prevnar 13: DUE Flu vaccine: declines Zostavax: $45 dollars from the patient, but had shingles in June Pap: 2011 norma- declines another MGM:01/2014  normal DEXA: Osteopenia 01/2014- Young Adult T-Score: -1.9 Colonoscopy:2008 neg- due 2018 (Dr. Sharlett Iles)  CT chest 2010 CXR 2012  Past Surgical History  Procedure Laterality Date  . Knee arthroplasty  2010    L knee  . Dilation and curettage of uterus    . Tonsillectomy    . Back surgery  1999    L5   . 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 Total shoulder arthroplasty  12/22/2011    Procedure: TOTAL SHOULDER ARTHROPLASTY;  Surgeon: Marin Shutter, MD;  Location: [facility];  Service: Orthopedics;  Laterality: Right;  right total shoulder arthroplasty   Family History  Problem Relation Age of Onset  . Cancer Mother   .  Hypertension Father   . Heart disease Brother     History reviewed: allergies, current medications, past family history, past medical history, past social history, past surgical history and problem list   Risk Factors: Osteoporosis/FallRisk: postmenopausal estrogen deficiency and dietary calcium and/or vitamin D deficiency In the past year have you fallen or had a near fall?:No History of fracture in the past year: no  Tobacco History  Substance Use Topics  . Smoking status: Former Smoker -- 0.25 packs/day for 20 years    Types: Cigarettes    Quit date: 12/16/1991  . Smokeless  tobacco: Never Used  . Alcohol Use: 4.8 oz/week    8 Glasses of wine per week   She does not smoke.  Patient is a former smoker. Are there smokers in your home (other than you)?  No  Alcohol Current alcohol use: social drinker  Caffeine Current caffeine use: coffee 1-2 /day  Exercise Current exercise: aerobics and yoga  Nutrition/Diet Current diet: in general, a "healthy" diet    Cardiac risk factors: advanced age (older than 22 for men, 70 for women).  Depression Screen (Note: if answer to either of the following is "Yes", a more complete depression screening is indicated)   Q1: Over the past two weeks, have you felt down, depressed or hopeless? No  Q2: Over the past two weeks, have you felt little interest or pleasure in doing things? No  Have you lost interest or pleasure in daily life? No  Do you often feel hopeless? No  Do you cry easily over simple problems? No  Activities of Daily Living In your present state of health, do you have any difficulty performing the following activities?:  Driving? No Managing money?  No Feeding yourself? No Getting from bed to chair? No Climbing a flight of stairs? No Preparing food and eating?: No Bathing or showering? No Getting dressed: No Getting to the toilet? No Using the toilet:No Moving around from place to place: No In the past year have you fallen or had a near fall?:No   Are you sexually active?  Yes  Do you have more than one partner?  No  Vision Difficulties: No  Hearing Difficulties: Yes Do you often ask people to speak up or repeat themselves? Yes Do you experience ringing or noises in your ears? No Do you have difficulty understanding soft or whispered voices? Yes  Cognition  Do you feel that you have a problem with memory?No  Do you often misplace items? No  Do you feel safe at home?  Yes  Advanced directives Does patient have a [place]? Yes Does patient have a Living Will?  Yes   Objective:     Blood pressure 138/78, pulse 60, temperature 97.7 F (36.5 C), resp. rate 16, height 5' 2.5" (1.588 m), weight 172 lb (78.019 kg). Body mass index is 30.94 kg/(m^2).  General appearance: alert, no distress, WD/WN, female Cognitive Testing  Alert? Yes  Normal Appearance?Yes  Oriented to person? Yes  Place? Yes   Time? Yes  Recall of three objects?  Yes  Can perform simple calculations? Yes  Displays appropriate judgment?Yes  Can read the correct time from a watch face?Yes  HEENT: normocephalic, sclerae anicteric, TMs pearly, nares patent, no discharge or erythema, pharynx normal Oral cavity: MMM, no lesions Neck: supple, no lymphadenopathy, no thyromegaly, no masses Heart: RRR, normal S1, S2, no murmurs Lungs: CTA bilaterally, no wheezes, rhonchi, or rales Abdomen: +bs, soft, + epigastric tender, non  distended, no masses, no hepatomegaly, no splenomegaly Musculoskeletal: nontender, no swelling, no obvious deformity Extremities: no edema, no cyanosis, no clubbing Pulses: 2+ symmetric, upper and lower extremities, normal cap refill Neurological: alert, oriented x 3, CN2-12 intact, strength normal upper extremities and lower extremities, sensation normal throughout, DTRs 2+ throughout, no cerebellar signs, gait normal Psychiatric: normal affect, behavior normal, pleasant   Medicare Attestation I have personally reviewed: The patient's medical and social history Their use of alcohol, tobacco or illicit drugs Their current medications and supplements The patient's functional ability including ADLs,fall risks, home safety risks, cognitive, and hearing and visual impairment Diet and physical activities Evidence for depression or mood disorders  The patient's weight, height, BMI, and visual acuity have been recorded in the chart.  I have made referrals, counseling, and provided education to the patient based on review of the above and I have provided the patient  with a written personalized care plan for preventive services.     Vicie Mutters, PA-C   05/05/2014

## 2014-05-06 LAB — MICROALBUMIN / CREATININE URINE RATIO
CREATININE, URINE: 58.7 mg/dL
MICROALB/CREAT RATIO: 3.4 mg/g (ref 0.0–30.0)
Microalb, Ur: 0.2 mg/dL (ref <2.0)

## 2014-05-06 LAB — URINALYSIS, ROUTINE W REFLEX MICROSCOPIC
Bilirubin Urine: NEGATIVE
Glucose, UA: NEGATIVE mg/dL
HGB URINE DIPSTICK: NEGATIVE
KETONES UR: NEGATIVE mg/dL
Leukocytes, UA: NEGATIVE
NITRITE: NEGATIVE
PROTEIN: NEGATIVE mg/dL
Specific Gravity, Urine: <1.005 — ABNORMAL LOW (ref 1.005–1.030)
UROBILINOGEN UA: 0.2 mg/dL (ref 0.0–1.0)
pH: 6.5 (ref 5.0–8.0)

## 2014-05-06 LAB — VITAMIN D 25 HYDROXY (VIT D DEFICIENCY, FRACTURES): Vit D, 25-Hydroxy: 36 ng/mL (ref 30–100)

## 2014-05-06 LAB — VARICELLA ZOSTER ANTIBODY, IGG: Varicella IgG: >4000 Index — ABNORMAL HIGH (ref <135.00)

## 2014-05-06 LAB — INSULIN, FASTING: Insulin fasting, serum: 2.9 u[IU]/mL (ref 2.0–19.6)

## 2014-06-25 ENCOUNTER — Telehealth: Payer: Self-pay

## 2014-06-25 NOTE — Telephone Encounter (Signed)
06/25/14 received disc from [facility] and filed on shelf.Michaela Kelley

## 2014-07-28 ENCOUNTER — Other Ambulatory Visit: Payer: Self-pay | Admitting: Internal Medicine

## 2015-03-10 ENCOUNTER — Other Ambulatory Visit: Payer: Self-pay | Admitting: Internal Medicine

## 2015-03-10 ENCOUNTER — Ambulatory Visit (INDEPENDENT_AMBULATORY_CARE_PROVIDER_SITE_OTHER): Payer: Medicare Other

## 2015-03-10 DIAGNOSIS — Z23 Encounter for immunization: Secondary | ICD-10-CM | POA: Diagnosis not present

## 2015-03-11 ENCOUNTER — Ambulatory Visit (INDEPENDENT_AMBULATORY_CARE_PROVIDER_SITE_OTHER): Payer: Medicare Other | Admitting: Internal Medicine

## 2015-03-11 ENCOUNTER — Encounter: Payer: Self-pay | Admitting: Internal Medicine

## 2015-03-11 VITALS — BP 142/80 | HR 62 | Temp 98.2°F | Resp 16 | Ht 62.5 in | Wt 139.0 lb

## 2015-03-11 DIAGNOSIS — M545 Low back pain, unspecified: Secondary | ICD-10-CM

## 2015-03-11 DIAGNOSIS — S060X0A Concussion without loss of consciousness, initial encounter: Secondary | ICD-10-CM

## 2015-03-11 DIAGNOSIS — S0101XA Laceration without foreign body of scalp, initial encounter: Secondary | ICD-10-CM | POA: Diagnosis not present

## 2015-03-11 DIAGNOSIS — W19XXXA Unspecified fall, initial encounter: Secondary | ICD-10-CM | POA: Diagnosis not present

## 2015-03-11 NOTE — Patient Instructions (Signed)
Concussion, Adult  A concussion, or closed-head injury, is a brain injury caused by a direct blow to the head or by a quick and sudden movement (jolt) of the head or neck. Concussions are usually not life-threatening. Even so, the effects of a concussion can be serious. If you have had a concussion before, you are more likely to experience concussion-like symptoms after a direct blow to the head.   CAUSES   Direct blow to the head, such as from running into another player during a soccer game, being hit in a fight, or hitting your head on a hard surface.   A jolt of the head or neck that causes the brain to move back and forth inside the skull, such as in a car crash.  SIGNS AND SYMPTOMS  The signs of a concussion can be hard to notice. Early on, they may be missed by you, family members, and health care providers. You may look fine but act or feel differently.  Symptoms are usually temporary, but they may last for days, weeks, or even longer. Some symptoms may appear right away while others may not show up for hours or days. Every head injury is different. Symptoms include:   Mild to moderate headaches that will not go away.   A feeling of pressure inside your head.   Having more trouble than usual:    Learning or remembering things you have heard.    Answering questions.    Paying attention or concentrating.    Organizing daily tasks.    Making decisions and solving problems.   Slowness in thinking, acting or reacting, speaking, or reading.   Getting lost or being easily confused.   Feeling tired all the time or lacking energy (fatigued).   Feeling drowsy.   Sleep disturbances.    Sleeping more than usual.    Sleeping less than usual.    Trouble falling asleep.    Trouble sleeping (insomnia).   Loss of balance or feeling lightheaded or dizzy.   Nausea or vomiting.   Numbness or tingling.   Increased sensitivity to:    Sounds.    Lights.    Distractions.   Vision problems or eyes that tire  easily.   Diminished sense of taste or smell.   Ringing in the ears.   Mood changes such as feeling sad or anxious.   Becoming easily irritated or angry for little or no reason.   Lack of motivation.   Seeing or hearing things other people do not see or hear (hallucinations).  DIAGNOSIS  Your health care provider can usually diagnose a concussion based on a description of your injury and symptoms. He or she will ask whether you passed out (lost consciousness) and whether you are having trouble remembering events that happened right before and during your injury.  Your evaluation might include:   A brain scan to look for signs of injury to the brain. Even if the test shows no injury, you may still have a concussion.   Blood tests to be sure other problems are not present.  TREATMENT   Concussions are usually treated in an emergency department, in urgent care, or at a clinic. You may need to stay in the hospital overnight for further treatment.   Tell your health care provider if you are taking any medicines, including prescription medicines, over-the-counter medicines, and natural remedies. Some medicines, such as blood thinners (anticoagulants) and aspirin, may increase the chance of complications. Also tell your health care   your age, and how healthy you were before the concussion.  Most people with mild injuries recover fully. Recovery can take time. In general, recovery is slower in older persons. Also, persons who have had a concussion in the past or have other medical problems may find that it takes longer to recover from their current injury. HOME  CARE INSTRUCTIONS General Instructions  Carefully follow the directions your health care provider gave you.  Only take over-the-counter or prescription medicines for pain, discomfort, or fever as directed by your health care provider.  Take only those medicines that your health care provider has approved.  Do not drink alcohol until your health care provider says you are well enough to do so. Alcohol and certain other drugs may slow your recovery and can put you at risk of further injury.  If it is harder than usual to remember things, write them down.  If you are easily distracted, try to do one thing at a time. For example, do not try to watch TV while fixing dinner.  Talk with family members or close friends when making important decisions.  Keep all follow-up appointments. Repeated evaluation of your symptoms is recommended for your recovery.  Watch your symptoms and tell others to do the same. Complications sometimes occur after a concussion. Older adults with a brain injury may have a higher risk of serious complications, such as a blood clot on the brain.  Tell your teachers, school nurse, school counselor, coach, athletic trainer, or work Freight forwarder about your injury, symptoms, and restrictions. Tell them about what you can or cannot do. They should watch for:  Increased problems with attention or concentration.  Increased difficulty remembering or learning new information.  Increased time needed to complete tasks or assignments.  Increased irritability or decreased ability to cope with stress.  Increased symptoms.  Rest. Rest helps the brain to heal. Make sure you:  Get plenty of sleep at night. Avoid staying up late at night.  Keep the same bedtime hours on weekends and weekdays.  Rest during the day. Take daytime naps or rest breaks when you feel tired.  Limit activities that require a lot of thought or concentration. These include:  Doing homework or job-related  work.  Watching TV.  Working on the computer.  Avoid any situation where there is potential for another head injury (football, hockey, soccer, basketball, martial arts, downhill snow sports and horseback riding). Your condition will get worse every time you experience a concussion. You should avoid these activities until you are evaluated by the appropriate follow-up health care providers. Returning To Your Regular Activities You will need to return to your normal activities slowly, not all at once. You must give your body and brain enough time for recovery.  Do not return to sports or other athletic activities until your health care provider tells you it is safe to do so.  Ask your health care provider when you can drive, ride a bicycle, or operate heavy machinery. Your ability to react may be slower after a brain injury. Never do these activities if you are dizzy.  Ask your health care provider about when you can return to work or school. Preventing Another Concussion It is very important to avoid another brain injury, especially before you have recovered. In rare cases, another injury can lead to permanent brain damage, brain swelling, or death. The risk of this is greatest during the first 7-10 days after a head injury. Avoid injuries by:  Wearing a  seat belt when riding in a car.  Drinking alcohol only in moderation.  Wearing a helmet when biking, skiing, skateboarding, skating, or doing similar activities.  Avoiding activities that could lead to a second concussion, such as contact or recreational sports, until your health care provider says it is okay.  Taking safety measures in your home.  Remove clutter and tripping hazards from floors and stairways.  Use grab bars in bathrooms and handrails by stairs.  Place non-slip mats on floors and in bathtubs.  Improve lighting in dim areas. SEEK MEDICAL CARE IF:  You have increased problems paying attention or  concentrating.  You have increased difficulty remembering or learning new information.  You need more time to complete tasks or assignments than before.  You have increased irritability or decreased ability to cope with stress.  You have more symptoms than before. Seek medical care if you have any of the following symptoms for more than 2 weeks after your injury:  Lasting (chronic) headaches.  Dizziness or balance problems.  Nausea.  Vision problems.  Increased sensitivity to noise or light.  Depression or mood swings.  Anxiety or irritability.  Memory problems.  Difficulty concentrating or paying attention.  Sleep problems.  Feeling tired all the time. SEEK IMMEDIATE MEDICAL CARE IF:  You have severe or worsening headaches. These may be a sign of a blood clot in the brain.  You have weakness (even if only in one hand, leg, or part of the face).  You have numbness.  You have decreased coordination.  You vomit repeatedly.  You have increased sleepiness.  One pupil is larger than the other.  You have convulsions.  You have slurred speech.  You have increased confusion. This may be a sign of a blood clot in the brain.  You have increased restlessness, agitation, or irritability.  You are unable to recognize people or places.  You have neck pain.  It is difficult to wake you up.  You have unusual behavior changes.  You lose consciousness. MAKE SURE YOU:  Understand these instructions.  Will watch your condition.  Will get help right away if you are not doing well or get worse.   This information is not intended to replace advice given to you by your health care provider. Make sure you discuss any questions you have with your health care provider.   Document Released: 06/04/2003 Document Revised: 04/04/2014 Document Reviewed: 10/04/2012 Elsevier Interactive Patient Education 2016 [place], Adult A laceration is a cut  that goes through all of the layers of the skin and into the tissue that is right under the skin. Some lacerations heal on their own. Others need to be closed with stitches (sutures), staples, skin adhesive strips, or skin glue. Proper laceration care minimizes the risk of infection and helps the laceration to heal better. HOW TO CARE FOR YOUR LACERATION If sutures or staples were used:  Keep the wound clean and dry.  If you were given a bandage (dressing), you should change it at least one time per day or as told by your health care provider. You should also change it if it becomes wet or dirty.  Keep the wound completely dry for the first 24 hours or as told by your health care provider. After that time, you may shower or bathe. However, make sure that the wound is not soaked in water until after the sutures or staples have been removed.  Clean the wound one time each day  or as told by your health care provider:  Wash the wound with soap and water.  Rinse the wound with water to remove all soap.  Pat the wound dry with a clean towel. Do not rub the wound.  After cleaning the wound, apply a thin layer of antibiotic ointmentas told by your health care provider. This will help to prevent infection and keep the dressing from sticking to the wound.  Have the sutures or staples removed as told by your health care provider. If skin adhesive strips were used:  Keep the wound clean and dry.  If you were given a bandage (dressing), you should change it at least one time per day or as told by your health care provider. You should also change it if it becomes dirty or wet.  Do not get the skin adhesive strips wet. You may shower or bathe, but be careful to keep the wound dry.  If the wound gets wet, pat it dry with a clean towel. Do not rub the wound.  Skin adhesive strips fall off on their own. You may trim the strips as the wound heals. Do not remove skin adhesive strips that are still stuck  to the wound. They will fall off in time. If skin glue was used:  Try to keep the wound dry, but you may briefly wet it in the shower or bath. Do not soak the wound in water, such as by swimming.  After you have showered or bathed, gently pat the wound dry with a clean towel. Do not rub the wound.  Do not do any activities that will make you sweat heavily until the skin glue has fallen off on its own.  Do not apply liquid, cream, or ointment medicine to the wound while the skin glue is in place. Using those may loosen the film before the wound has healed.  If you were given a bandage (dressing), you should change it at least one time per day or as told by your health care provider. You should also change it if it becomes dirty or wet.  If a dressing is placed over the wound, be careful not to apply tape directly over the skin glue. Doing that may cause the glue to be pulled off before the wound has healed.  Do not pick at the glue. The skin glue usually remains in place for 5-10 days, then it falls off of the skin. General Instructions  Take over-the-counter and prescription medicines only as told by your health care provider.  If you were prescribed an antibiotic medicine or ointment, take or apply it as told by your doctor. Do not stop using it even if your condition improves.  To help prevent scarring, make sure to cover your wound with sunscreen whenever you are outside after stitches are removed, after adhesive strips are removed, or when glue remains in place and the wound is healed. Make sure to wear a sunscreen of at least 30 SPF.  Do not scratch or pick at the wound.  Keep all follow-up visits as told by your health care provider. This is important.  Check your wound every day for signs of infection. Watch for:  Redness, swelling, or pain.  Fluid, blood, or pus.  Raise (elevate) the injured area above the level of your heart while you are sitting or lying down, if  possible. SEEK MEDICAL CARE IF:  You received a tetanus shot and you have swelling, severe pain, redness, or bleeding at the  injection site.  You have a fever.  A wound that was closed breaks open.  You notice a bad smell coming from your wound or your dressing.  You notice something coming out of the wound, such as wood or glass.  Your pain is not controlled with medicine.  You have increased redness, swelling, or pain at the site of your wound.  You have fluid, blood, or pus coming from your wound.  You notice a change in the color of your skin near your wound.  You need to change the dressing frequently due to fluid, blood, or pus draining from the wound.  You develop a new rash.  You develop numbness around the wound. SEEK IMMEDIATE MEDICAL CARE IF:  You develop severe swelling around the wound.  Your pain suddenly increases and is severe.  You develop painful lumps near the wound or on skin that is anywhere on your body.  You have a red streak going away from your wound.  The wound is on your hand or foot and you cannot properly move a finger or toe.  The wound is on your hand or foot and you notice that your fingers or toes look pale or bluish.   This information is not intended to replace advice given to you by your health care provider. Make sure you discuss any questions you have with your health care provider.   Document Released: 03/14/2005 Document Revised: 07/29/2014 Document Reviewed: 03/10/2014 Elsevier Interactive Patient Education [employer].

## 2015-03-11 NOTE — Progress Notes (Signed)
Subjective:    Patient ID: Michaela Kelley, female    DOB: 1941-02-01, 74 y.o.   MRN: [id number]  Fall Associated symptoms include headaches. Pertinent negatives include no fever, nausea or vomiting.  Patient presents to the office for evaluation of fall yesterday.  She reports that she had a piece of toast and a pear and that she had drank 3 glasses of wine and she reports that the last thing that she remembered was going to the bathroom.  She reports that she was found down in the bathroom with the sink on.  She reports that she did hit her head and she doesn't remember falling or feeling strange. Her daughter found her and she said her Mom told her she was not okay.  She thinks that her mom hit her head on the wall and she then pulled the shower curtain down as she fell. She was awake and talking to her daughter when she was in the bathtub.  EMS was called and they helped clean her up.  She remembers seeing EMS.  She reports that she was told to go to the ER and she said no.  She complained of her head hurting last night.  She did sleep fine last night and she did put ice on her head last night.  She reports that today she has felt mildly disoriented.  She reports her neck shoulders and back are sore.  She reports that she does have a mild headache today.      Review of Systems  Constitutional: Positive for fatigue. Negative for fever and chills.  Eyes: Negative for visual disturbance.  Respiratory: Negative for chest tightness and shortness of breath.   Gastrointestinal: Negative for nausea and vomiting.  Musculoskeletal: Positive for myalgias, back pain and neck pain.  Neurological: Positive for syncope and headaches. Negative for dizziness, speech difficulty, weakness and light-headedness.  Psychiatric/Behavioral: Positive for confusion and decreased concentration. Negative for behavioral problems and agitation. The patient is not nervous/anxious.        Objective:   Physical Exam   Constitutional: She is oriented to person, place, and time. She appears well-developed and well-nourished. No distress.  HENT:  Head: Normocephalic.  Mouth/Throat: Oropharynx is clear and moist. No oropharyngeal exudate.  Eyes: Conjunctivae are normal. No scleral icterus.  Neck: Normal range of motion. Neck supple. No JVD present. No thyromegaly present.  Cardiovascular: Normal rate, regular rhythm, normal heart sounds and intact distal pulses.  Exam reveals no gallop and no friction rub.   No murmur heard. Pulmonary/Chest: Effort normal and breath sounds normal. No respiratory distress. She has no wheezes. She has no rales. She exhibits no tenderness.  Abdominal: Soft. Bowel sounds are normal. She exhibits no distension and no mass. There is no tenderness. There is no rebound and no guarding.  Musculoskeletal: Normal range of motion.       Right shoulder: She exhibits normal range of motion, no tenderness, no bony tenderness, no swelling, no effusion, no crepitus, no deformity, no laceration, no pain, no spasm, normal pulse and normal strength.       Cervical back: She exhibits normal range of motion, no tenderness, no bony tenderness, no swelling, no edema, no deformity, no laceration, no pain, no spasm and normal pulse.       Thoracic back: Normal. She exhibits normal range of motion, no tenderness, no bony tenderness, no swelling, no edema, no deformity, no laceration, no pain, no spasm and normal pulse.  Lumbar back: Normal. She exhibits normal range of motion, no tenderness, no bony tenderness, no swelling, no edema, no deformity, no laceration, no pain, no spasm and normal pulse.  Lymphadenopathy:    She has no cervical adenopathy.  Neurological: She is alert and oriented to person, place, and time. She has normal strength. No cranial nerve deficit or sensory deficit. Coordination and gait normal. GCS eye subscore is 4. GCS verbal subscore is 5. GCS motor subscore is 6.  Normal finger  to nose, normal alternating rapid hand movement, normal gait, negative romberg  Skin: Skin is warm and dry. She is not diaphoretic.  Psychiatric: She has a normal mood and affect. Her behavior is normal. Judgment and thought content normal.  Nursing note and vitals reviewed.    Filed Vitals:   03/11/15 1125  BP: 142/80  Pulse: 62  Temp: 98.2 F (36.8 C)  Resp: 16           Assessment & Plan:    1. Concussion with no loss of consciousness, initial encounter -neuro exam normal -history and exam consistent with concussion -not on blood thinners -no imaging required at this time  2. Scalp laceration, initial encounter -no sutures needed now -tdap up to date -keep clean and dry  3. Midline low back pain without sciatica  -offered muscle relaxer which she declined -tylenol prn   4. Fall, initial encounter -likely secondary to alcohol intake -doubt tia -doubt stroke

## 2015-03-12 ENCOUNTER — Other Ambulatory Visit: Payer: Self-pay

## 2015-03-12 DIAGNOSIS — Z1231 Encounter for screening mammogram for malignant neoplasm of breast: Secondary | ICD-10-CM

## 2015-04-02 ENCOUNTER — Ambulatory Visit
Admission: RE | Admit: 2015-04-02 | Discharge: 2015-04-02 | Disposition: A | Discharge status: Home / Self Care | Payer: PPO | Source: Ambulatory Visit

## 2015-04-02 DIAGNOSIS — Z1231 Encounter for screening mammogram for malignant neoplasm of breast: Secondary | ICD-10-CM | POA: Diagnosis not present

## 2015-04-27 DIAGNOSIS — H35372 Puckering of macula, left eye: Secondary | ICD-10-CM | POA: Diagnosis not present

## 2015-04-27 DIAGNOSIS — Z961 Presence of intraocular lens: Secondary | ICD-10-CM | POA: Diagnosis not present

## 2015-05-06 ENCOUNTER — Ambulatory Visit (HOSPITAL_COMMUNITY)
Admission: RE | Admit: 2015-05-06 | Discharge: 2015-05-06 | Disposition: A | Discharge status: Home / Self Care | Payer: PPO | Source: Ambulatory Visit | Attending: Physician Assistant | Admitting: Physician Assistant

## 2015-05-06 ENCOUNTER — Encounter: Payer: Self-pay | Admitting: Physician Assistant

## 2015-05-06 ENCOUNTER — Ambulatory Visit (INDEPENDENT_AMBULATORY_CARE_PROVIDER_SITE_OTHER): Payer: PPO | Admitting: Physician Assistant

## 2015-05-06 VITALS — BP 110/70 | HR 75 | Temp 97.9°F | Resp 14 | Ht 62.0 in | Wt 142.8 lb

## 2015-05-06 DIAGNOSIS — I10 Essential (primary) hypertension: Secondary | ICD-10-CM

## 2015-05-06 DIAGNOSIS — E785 Hyperlipidemia, unspecified: Secondary | ICD-10-CM

## 2015-05-06 DIAGNOSIS — R7303 Prediabetes: Secondary | ICD-10-CM | POA: Diagnosis not present

## 2015-05-06 DIAGNOSIS — R9389 Abnormal findings on diagnostic imaging of other specified body structures: Secondary | ICD-10-CM

## 2015-05-06 DIAGNOSIS — R6889 Other general symptoms and signs: Secondary | ICD-10-CM | POA: Diagnosis not present

## 2015-05-06 DIAGNOSIS — E559 Vitamin D deficiency, unspecified: Secondary | ICD-10-CM | POA: Diagnosis not present

## 2015-05-06 DIAGNOSIS — R7309 Other abnormal glucose: Secondary | ICD-10-CM | POA: Diagnosis not present

## 2015-05-06 DIAGNOSIS — Z0000 Encounter for general adult medical examination without abnormal findings: Secondary | ICD-10-CM

## 2015-05-06 DIAGNOSIS — Z0001 Encounter for general adult medical examination with abnormal findings: Secondary | ICD-10-CM | POA: Diagnosis not present

## 2015-05-06 DIAGNOSIS — Z79899 Other long term (current) drug therapy: Secondary | ICD-10-CM | POA: Diagnosis not present

## 2015-05-06 DIAGNOSIS — M19019 Primary osteoarthritis, unspecified shoulder: Secondary | ICD-10-CM

## 2015-05-06 DIAGNOSIS — J302 Other seasonal allergic rhinitis: Secondary | ICD-10-CM

## 2015-05-06 LAB — CBC WITH DIFFERENTIAL/PLATELET
Basophils Absolute: 0.1 10*3/uL (ref 0.0–0.1)
Basophils Relative: 1 % (ref 0–1)
Eosinophils Absolute: 0.1 10*3/uL (ref 0.0–0.7)
Eosinophils Relative: 1 % (ref 0–5)
HEMATOCRIT: 37 % (ref 36.0–46.0)
HEMOGLOBIN: 12.1 g/dL (ref 12.0–15.0)
LYMPHS ABS: 2.3 10*3/uL (ref 0.7–4.0)
Lymphocytes Relative: 33 % (ref 12–46)
MCH: 29.5 pg (ref 26.0–34.0)
MCHC: 32.7 g/dL (ref 30.0–36.0)
MCV: 90.2 fL (ref 78.0–100.0)
MONOS PCT: 11 % (ref 3–12)
MPV: 8.4 fL — AB (ref 8.6–12.4)
Monocytes Absolute: 0.8 10*3/uL (ref 0.1–1.0)
NEUTROS ABS: 3.7 10*3/uL (ref 1.7–7.7)
Neutrophils Relative %: 54 % (ref 43–77)
PLATELETS: 308 10*3/uL (ref 150–400)
RBC: 4.1 MIL/uL (ref 3.87–5.11)
RDW: 13.7 % (ref 11.5–15.5)
WBC: 6.9 10*3/uL (ref 4.0–10.5)

## 2015-05-06 LAB — HEPATIC FUNCTION PANEL
ALK PHOS: 55 U/L (ref 33–130)
ALT: 22 U/L (ref 6–29)
AST: 17 U/L (ref 10–35)
Albumin: 4.4 g/dL (ref 3.6–5.1)
BILIRUBIN TOTAL: 0.8 mg/dL (ref 0.2–1.2)
Bilirubin, Direct: 0.2 mg/dL (ref <=0.2)
Indirect Bilirubin: 0.6 mg/dL (ref 0.2–1.2)
Total Protein: 7.1 g/dL (ref 6.1–8.1)

## 2015-05-06 LAB — BASIC METABOLIC PANEL WITH GFR
BUN: 14 mg/dL (ref 7–25)
CHLORIDE: 102 mmol/L (ref 98–110)
CO2: 25 mmol/L (ref 20–31)
Calcium: 9.8 mg/dL (ref 8.6–10.4)
Creat: 0.82 mg/dL (ref 0.60–0.93)
GFR, Est African American: 82 mL/min (ref >=60)
GFR, Est Non African American: 71 mL/min (ref >=60)
GLUCOSE: 93 mg/dL (ref 65–99)
POTASSIUM: 4.2 mmol/L (ref 3.5–5.3)
Sodium: 136 mmol/L (ref 135–146)

## 2015-05-06 LAB — MAGNESIUM: Magnesium: 1.9 mg/dL (ref 1.5–2.5)

## 2015-05-06 LAB — LIPID PANEL
CHOL/HDL RATIO: 2.9 ratio (ref <=5.0)
Cholesterol: 211 mg/dL — ABNORMAL HIGH (ref 125–200)
HDL: 74 mg/dL (ref >=46)
LDL CALC: 108 mg/dL (ref <130)
Triglycerides: 144 mg/dL (ref <150)
VLDL: 29 mg/dL (ref <30)

## 2015-05-06 LAB — TSH: TSH: 1.46 mIU/L

## 2015-05-06 MED ORDER — ATENOLOL 50 MG PO TABS: 50.0000 mg | ORAL_TABLET | Freq: Every day | ORAL | Status: DC

## 2015-05-06 NOTE — Progress Notes (Signed)
CPE  Assessment:   1. Essential hypertension  Will monitor BP at home, call if greater than 140/90 - CBC with Differential/Platelet - BASIC METABOLIC PANEL WITH GFR - Hepatic function panel - TSH - Urinalysis, Routine w reflex microscopic - Microalbumin / creatinine urine ratio - EKG 12-Lead  2. Prediabetes Discussed general issues about diabetes pathophysiology and management., Educational material distributed., Suggested low cholesterol diet., Encouraged aerobic exercise., Discussed foot care., Reminded to get yearly retinal exam. - Hemoglobin A1c - Insulin, fasting - HM DIABETES FOOT EXAM  3. Hyperlipidemia - check lipids, decrease fatty foods, increase activity.  - Lipid panel  4. Vitamin D deficiency - Vit D  25 hydroxy (rtn osteoporosis monitoring)  5. Medication management - Magnesium  6. Shoulder arthritis Remission. S/p replacement  7. Abnormal chest x-ray Get CXR  8. Arthritis Continue OTC meds  9. Seasonal allergic rhinitis Allergic rhinitis- Allegra OTC, increase H20, allergy hygiene explained.  10. Encounter for general adult medical examination with abnormal findings  Future Appointments Date Time Provider [place]  05/09/2016 10:00 AM Vicie Mutters, PA-C [facility] None      Subjective:  Michaela Kelley is a 75 y.o. female who presents for CPE.    Her blood pressure has been controlled at home, she is on a full tablet due to palpitations over July 4th, today their BP is BP: 110/70 mmHg She does workout, yoga and water exercise and golf during the summer. She denies chest pain, shortness of breath, dizziness. She did have a fall in Dec likely due to ETOH, no other falls, denies excessive intake ETOH. She is on cholesterol medication, she is off lipitor due to cramping. Her cholesterol is not at goal. The cholesterol last visit was:   Lab Results  Component Value Date   CHOL 167 05/05/2014   HDL 76 05/05/2014   LDLCALC 52 05/05/2014   TRIG 195* 05/05/2014   CHOLHDL 2.2 05/05/2014   She has been working on diet and exercise for prediabetes, and denies paresthesia of the feet, polydipsia, polyuria and visual disturbances. Last A1C in the office was:  Lab Results  Component Value Date   HGBA1C 5.4 05/05/2014  Patient is on Vitamin D supplement.   Lab Results  Component Value Date   VD25OH 36 05/05/2014  Occ heart burn will take tums.    Names of Other Physician/Practitioners you currently use: 1. [facility] here for primary care 2. Dr. Celene Squibb, eye doctor, last visit 6 months, cataract sx bilateral 3. Dr. Edsel Petrin, dentist, last visit 2 weeks ago had root canal 4. Dr. Sharlett Iles, GI 5. [place] 6. Dr. Marguerite Olea, derm Patient Care Team: Unk Pinto, MD as PCP - General (Internal Medicine)  Medication Review: Current Outpatient Prescriptions on File Prior to Visit  Medication Sig Dispense Refill  . aspirin EC 81 MG tablet Take 81 mg by mouth daily.      Marland Kitchen atenolol (TENORMIN) 50 MG tablet TAKE 1 TABLET DAILY. 90 tablet 2  . b complex vitamins capsule Take 1 capsule by mouth daily.    . Cholecalciferol (VITAMIN D PO) Take 2,000 Int'l Units by mouth daily.    Marland Kitchen MAGNESIUM PO Take 250 mg by mouth daily.     . Omega-3 Fatty Acids (FISH OIL) 1000 MG CAPS Take by mouth daily.    Marland Kitchen OVER THE COUNTER MEDICATION 750 mg daily. CURAMED     No current facility-administered medications on file prior to visit.    Current Problems (verified)  Patient Active Problem List   Diagnosis Date Noted  . Medication management 10/03/2013  . Seasonal allergic rhinitis   . Arthritis   . Prediabetes   . Vitamin D deficiency   . Shoulder arthritis 12/23/2011  . Hyperlipidemia 01/02/2008  . Essential hypertension 01/02/2008  . Abnormal chest x-ray 01/02/2008    Screening Tests Health Maintenance  Topic Date Due  . ZOSTAVAX  06/09/2001  . COLONOSCOPY  03/28/2008  . INFLUENZA  VACCINE  10/27/2015  . MAMMOGRAM  04/01/2017  . TETANUS/TDAP  05/26/2021  . DEXA SCAN  Completed  . PNA vac Low Risk Adult  Completed    Immunization History  Administered Date(s) Administered  . Influenza, High Dose Seasonal PF 03/10/2015  . Pneumococcal Conjugate-13 05/05/2014  . Pneumococcal Polysaccharide-23 06/28/2006  . Td 05/27/2011   Preventative care: Tetanus: 2013 Pneumovax: 2008 Prevnar 13: 2016 Flu vaccine: 2016 Zostavax: declines  Pap: 2011 normal- declines another MGM:03/2015 DEXA: Osteopenia 01/2014- Young Adult T-Score: -1.9 Colonoscopy:2008 neg- due 2018 (Dr. Sharlett Iles)  CT chest 2010 CXR 2012  Past Surgical History  Procedure Laterality Date  . Knee arthroplasty  2010    L knee  . Dilation and curettage of uterus    . Tonsillectomy    . Back surgery  1999    L5   . Total shoulder arthroplasty  02/10/2011    Procedure: TOTAL SHOULDER ARTHROPLASTY;  Surgeon: Metta Clines Supple;  Location: [facility];  Service: Orthopedics;  Laterality: Left;  TOTAL SHOULDER ARTHROPLASTY LEFT SIDE  . Total shoulder arthroplasty  12/22/2011    Procedure: TOTAL SHOULDER ARTHROPLASTY;  Surgeon: Marin Shutter, MD;  Location: [facility];  Service: Orthopedics;  Laterality: Right;  right total shoulder arthroplasty   Family History  Problem Relation Age of Onset  . Cancer Mother   . Hypertension Father   . Heart disease Brother    Tobacco Social History  Substance Use Topics  . Smoking status: Former Smoker -- 0.25 packs/day for 20 years    Types: Cigarettes    Quit date: 12/16/1991  . Smokeless tobacco: Never Used  . Alcohol Use: 4.8 oz/week    8 Glasses of wine per week    Objective:     Blood pressure 110/70, pulse 75, temperature 97.9 F (36.6 C), resp. rate 14, height 5\' 2"  (1.575 m), weight 142 lb 12.8 oz (64.774 kg), SpO2 98 %. Body mass index is 26.11 kg/(m^2).  General appearance: alert, no distress, WD/WN, female HEENT: normocephalic, sclerae anicteric, TMs pearly,  nares patent, no discharge or erythema, pharynx normal Oral cavity: MMM, no lesions Neck: supple, no lymphadenopathy, no thyromegaly, no masses Heart: RRR, normal S1, S2, no murmurs Lungs: CTA bilaterally, no wheezes, rhonchi, or rales Abdomen: +bs, soft, non tender, non distended, no masses, no hepatomegaly, no splenomegaly Musculoskeletal: nontender, no swelling, no obvious deformity Extremities: no edema, no cyanosis, no clubbing Pulses: 2+ symmetric, upper and lower extremities, normal cap refill Neurological: alert, oriented x 3, CN2-12 intact, strength normal upper extremities and lower extremities, sensation normal throughout, DTRs 2+ throughout, no cerebellar signs, gait normal Psychiatric: normal affect, behavior normal, pleasant   EKG- sinus brady, no ST changes.      Vicie Mutters, PA-C   05/06/2015

## 2015-05-06 NOTE — Patient Instructions (Signed)

## 2015-05-07 LAB — URINALYSIS, ROUTINE W REFLEX MICROSCOPIC
BILIRUBIN URINE: NEGATIVE
Glucose, UA: NEGATIVE
Hgb urine dipstick: NEGATIVE
KETONES UR: NEGATIVE
Leukocytes, UA: NEGATIVE
Nitrite: NEGATIVE
Protein, ur: NEGATIVE
SPECIFIC GRAVITY, URINE: 1.018 (ref 1.001–1.035)
pH: 6.5 (ref 5.0–8.0)

## 2015-05-07 LAB — MICROALBUMIN / CREATININE URINE RATIO
Creatinine, Urine: 112 mg/dL (ref 20–320)
Microalb Creat Ratio: 4 mcg/mg creat (ref <30)
Microalb, Ur: 0.5 mg/dL

## 2015-05-07 LAB — HEMOGLOBIN A1C
Hgb A1c MFr Bld: 5.6 % (ref <5.7)
MEAN PLASMA GLUCOSE: 114 mg/dL (ref <117)

## 2015-05-07 LAB — VITAMIN D 25 HYDROXY (VIT D DEFICIENCY, FRACTURES): Vit D, 25-Hydroxy: 36 ng/mL (ref 30–100)

## 2015-07-08 DIAGNOSIS — D692 Other nonthrombocytopenic purpura: Secondary | ICD-10-CM | POA: Diagnosis not present

## 2015-07-08 DIAGNOSIS — L814 Other melanin hyperpigmentation: Secondary | ICD-10-CM | POA: Diagnosis not present

## 2015-07-08 DIAGNOSIS — D1801 Hemangioma of skin and subcutaneous tissue: Secondary | ICD-10-CM | POA: Diagnosis not present

## 2015-07-08 DIAGNOSIS — L821 Other seborrheic keratosis: Secondary | ICD-10-CM | POA: Diagnosis not present

## 2015-07-08 DIAGNOSIS — L918 Other hypertrophic disorders of the skin: Secondary | ICD-10-CM | POA: Diagnosis not present

## 2015-07-27 DIAGNOSIS — H35372 Puckering of macula, left eye: Secondary | ICD-10-CM | POA: Diagnosis not present

## 2015-07-27 DIAGNOSIS — H26492 Other secondary cataract, left eye: Secondary | ICD-10-CM | POA: Diagnosis not present

## 2015-08-06 DIAGNOSIS — H26492 Other secondary cataract, left eye: Secondary | ICD-10-CM | POA: Diagnosis not present

## 2015-09-01 DIAGNOSIS — H5310 Unspecified subjective visual disturbances: Secondary | ICD-10-CM | POA: Diagnosis not present

## 2015-10-26 DIAGNOSIS — H35372 Puckering of macula, left eye: Secondary | ICD-10-CM | POA: Diagnosis not present

## 2015-10-26 DIAGNOSIS — M1711 Unilateral primary osteoarthritis, right knee: Secondary | ICD-10-CM | POA: Diagnosis not present

## 2015-10-26 DIAGNOSIS — G8929 Other chronic pain: Secondary | ICD-10-CM | POA: Diagnosis not present

## 2015-10-26 DIAGNOSIS — M79672 Pain in left foot: Secondary | ICD-10-CM | POA: Diagnosis not present

## 2015-11-06 DIAGNOSIS — L918 Other hypertrophic disorders of the skin: Secondary | ICD-10-CM | POA: Diagnosis not present

## 2015-11-06 DIAGNOSIS — H61002 Unspecified perichondritis of left external ear: Secondary | ICD-10-CM | POA: Diagnosis not present

## 2015-11-11 ENCOUNTER — Encounter: Payer: Self-pay | Admitting: Internal Medicine

## 2015-11-11 ENCOUNTER — Ambulatory Visit (INDEPENDENT_AMBULATORY_CARE_PROVIDER_SITE_OTHER): Payer: PPO | Admitting: Internal Medicine

## 2015-11-11 VITALS — BP 130/76 | HR 64 | Temp 97.3°F | Resp 16 | Ht 62.0 in | Wt 144.8 lb

## 2015-11-11 DIAGNOSIS — E785 Hyperlipidemia, unspecified: Secondary | ICD-10-CM

## 2015-11-11 DIAGNOSIS — Z79899 Other long term (current) drug therapy: Secondary | ICD-10-CM | POA: Diagnosis not present

## 2015-11-11 DIAGNOSIS — R7303 Prediabetes: Secondary | ICD-10-CM | POA: Diagnosis not present

## 2015-11-11 DIAGNOSIS — E559 Vitamin D deficiency, unspecified: Secondary | ICD-10-CM | POA: Diagnosis not present

## 2015-11-11 DIAGNOSIS — I10 Essential (primary) hypertension: Secondary | ICD-10-CM | POA: Diagnosis not present

## 2015-11-11 LAB — LIPID PANEL
CHOLESTEROL: 230 mg/dL — AB (ref 125–200)
HDL: 71 mg/dL (ref >=46)
LDL Cholesterol: 117 mg/dL (ref <130)
TRIGLYCERIDES: 211 mg/dL — AB (ref <150)
Total CHOL/HDL Ratio: 3.2 Ratio (ref <=5.0)
VLDL: 42 mg/dL — AB (ref <30)

## 2015-11-11 LAB — CBC WITH DIFFERENTIAL/PLATELET
BASOS ABS: 59 {cells}/uL (ref 0–200)
Basophils Relative: 1 %
EOS PCT: 3 %
Eosinophils Absolute: 177 cells/uL (ref 15–500)
HCT: 37.2 % (ref 35.0–45.0)
Hemoglobin: 12.4 g/dL (ref 11.7–15.5)
LYMPHS PCT: 38 %
Lymphs Abs: 2242 cells/uL (ref 850–3900)
MCH: 30.5 pg (ref 27.0–33.0)
MCHC: 33.3 g/dL (ref 32.0–36.0)
MCV: 91.4 fL (ref 80.0–100.0)
MPV: 8.3 fL (ref 7.5–12.5)
Monocytes Absolute: 590 cells/uL (ref 200–950)
Monocytes Relative: 10 %
NEUTROS PCT: 48 %
Neutro Abs: 2832 cells/uL (ref 1500–7800)
Platelets: 313 10*3/uL (ref 140–400)
RBC: 4.07 MIL/uL (ref 3.80–5.10)
RDW: 13.6 % (ref 11.0–15.0)
WBC: 5.9 10*3/uL (ref 3.8–10.8)

## 2015-11-11 LAB — BASIC METABOLIC PANEL WITH GFR
BUN: 12 mg/dL (ref 7–25)
CALCIUM: 9.7 mg/dL (ref 8.6–10.4)
CO2: 25 mmol/L (ref 20–31)
CREATININE: 0.88 mg/dL (ref 0.60–0.93)
Chloride: 100 mmol/L (ref 98–110)
GFR, EST AFRICAN AMERICAN: 75 mL/min (ref >=60)
GFR, EST NON AFRICAN AMERICAN: 65 mL/min (ref >=60)
Glucose, Bld: 88 mg/dL (ref 65–99)
Potassium: 4.3 mmol/L (ref 3.5–5.3)
SODIUM: 134 mmol/L — AB (ref 135–146)

## 2015-11-11 LAB — HEPATIC FUNCTION PANEL
ALT: 27 U/L (ref 6–29)
AST: 23 U/L (ref 10–35)
Albumin: 4.2 g/dL (ref 3.6–5.1)
Alkaline Phosphatase: 60 U/L (ref 33–130)
BILIRUBIN INDIRECT: 0.7 mg/dL (ref 0.2–1.2)
Bilirubin, Direct: 0.1 mg/dL (ref <=0.2)
Total Bilirubin: 0.8 mg/dL (ref 0.2–1.2)
Total Protein: 7 g/dL (ref 6.1–8.1)

## 2015-11-11 LAB — MAGNESIUM: MAGNESIUM: 1.7 mg/dL (ref 1.5–2.5)

## 2015-11-11 LAB — INSULIN, RANDOM: Insulin: 8.9 u[IU]/mL (ref 2.0–19.6)

## 2015-11-11 LAB — TSH: TSH: 2.16 mIU/L

## 2015-11-11 NOTE — Patient Instructions (Signed)

## 2015-11-11 NOTE — Progress Notes (Signed)
[facility]                       Unk Pinto, M.D.        Uvaldo Bristle. Silverio Lay, P.A.-C       Starlyn Skeans, P.A.-C   [facility]                [address] [place], [place] [id number] Telephone [phone] Telefax [phone] ______________________________________________________________________     This very nice 75 Kelley presents for 6 month follow up with Hypertension, Hyperlipidemia, Pre-Diabetes and Vitamin D Deficiency.      Patient is treated for HTN & BP has been controlled at home. Today's BP: 130/76. Patient has had no complaints of any cardiac type chest pain, palpitations, dyspnea/orthopnea/PND, dizziness, claudication, or dependent edema.     Patient is intolerant to Statins and is attempting diet mgmt and is not controlled with diet & meds.  Last Lipids were not at goal:  Lab Results  Component Value Date   CHOL 211 (H) 05/06/2015   HDL 74 05/06/2015   LDLCALC 108 05/06/2015   TRIG 144 05/06/2015   CHOLHDL 2.9 05/06/2015      Also, the patient has history of abnormal glucose and has had no symptoms of reactive hypoglycemia, diabetic polys, paresthesias or visual blurring.  Last A1c was at goal: Lab Results  Component Value Date   HGBA1C 5.6 05/06/2015      Further, the patient also has history of Vitamin D Deficiency and supplements sporadically vitamin D without any suspected side-effects. Last vitamin D was very low:  Lab Results  Component Value Date   VD25OH 36 05/06/2015   Current Outpatient Prescriptions on File Prior to Visit  Medication Sig  . aspirin EC 81 MG tablet Take 81 mg by mouth daily.    Marland Kitchen atenolol (TENORMIN) 50 MG tablet Take 1 tablet (50 mg total) by mouth daily.  Marland Kitchen b complex vitamins capsule Take 1 capsule by mouth daily.  . Cholecalciferol (VITAMIN D PO) Take 2,000 Int'l Units by mouth daily.  Marland Kitchen MAGNESIUM PO Take 250 mg by mouth daily.   . Omega-3  Fatty Acids (FISH OIL) 1000 MG CAPS Take by mouth daily.  Marland Kitchen OVER THE COUNTER MEDICATION 750 mg daily. CURAMED   No current facility-administered medications on file prior to visit.    Allergies  Allergen Reactions  . Codeine Nausea And Vomiting and Other (See Comments)    Severe headaches (tolerates Dilaudid)  . Hydrocodone-Acetaminophen Nausea And Vomiting and Other (See Comments)    Severe headaches (tolerates Dilaudid)  . Oxycodone-Acetaminophen Nausea And Vomiting and Other (See Comments)    Severe headaches (tolerates Dilaudid)  . Oxycodone-Aspirin Nausea And Vomiting and Other (See Comments)    Severe headaches (tolerates Dilaudid)   PMHx:   Past Medical History:  Diagnosis Date  . Arthritis   . Hx: UTI (urinary tract infection)   . Hyperlipidemia   . Hypertension   . Prediabetes   . Seasonal allergic rhinitis   . Vitamin D deficiency    Immunization History  Administered Date(s) Administered  . Influenza, High Dose Seasonal PF 03/10/2015  . Pneumococcal Conjugate-13 05/05/2014  . Pneumococcal Polysaccharide-23 06/28/2006  . Td 05/27/2011   Past Surgical History:  Procedure Laterality Date  . BACK SURGERY  1999   L5   . DILATION AND  CURETTAGE OF UTERUS    . KNEE ARTHROPLASTY  2010   L knee  . TONSILLECTOMY    . TOTAL SHOULDER ARTHROPLASTY  02/10/2011   Procedure: TOTAL SHOULDER ARTHROPLASTY;  Surgeon: Metta Clines Supple;  Location: [facility];  Service: Orthopedics;  Laterality: Left;  TOTAL SHOULDER ARTHROPLASTY LEFT SIDE  . TOTAL SHOULDER ARTHROPLASTY  12/22/2011   Procedure: TOTAL SHOULDER ARTHROPLASTY;  Surgeon: Marin Shutter, MD;  Location: [facility];  Service: Orthopedics;  Laterality: Right;  right total shoulder arthroplasty   FHx:    Reviewed / unchanged  SHx:    Reviewed / unchanged  Systems Review:  Constitutional: Denies fever, chills, wt changes, headaches, insomnia, fatigue, night sweats, change in appetite. Eyes: Denies redness, blurred vision, diplopia,  discharge, itchy, watery eyes.  ENT: Denies discharge, congestion, post nasal drip, epistaxis, sore throat, earache, hearing loss, dental pain, tinnitus, vertigo, sinus pain, snoring.  CV: Denies chest pain, palpitations, irregular heartbeat, syncope, dyspnea, diaphoresis, orthopnea, PND, claudication or edema. Respiratory: denies cough, dyspnea, DOE, pleurisy, hoarseness, laryngitis, wheezing.  Gastrointestinal: Denies dysphagia, odynophagia, heartburn, reflux, water brash, abdominal pain or cramps, nausea, vomiting, bloating, diarrhea, constipation, hematemesis, melena, hematochezia  or hemorrhoids. Genitourinary: Denies dysuria, frequency, urgency, nocturia, hesitancy, discharge, hematuria or flank pain. Musculoskeletal: Denies arthralgias, myalgias, stiffness, jt. swelling, pain, limping or strain/sprain.  Skin: Denies pruritus, rash, hives, warts, acne, eczema or change in skin lesion(s). Neuro: No weakness, tremor, incoordination, spasms, paresthesia or pain. Psychiatric: Denies confusion, memory loss or sensory loss. Endo: Denies change in weight, skin or hair change.  Heme/Lymph: No excessive bleeding, bruising or enlarged lymph nodes.  Physical Exam  BP 130/76   Pulse 64   Temp 97.3 F (36.3 C)   Resp 16   Ht 5\' 2"  (1.575 m)   Wt 144 lb 12.8 oz (65.7 kg)   BMI 26.48 kg/m   Appears well nourished and in no distress.  Eyes: PERRLA, EOMs, conjunctiva no swelling or erythema. Sinuses: No frontal/maxillary tenderness ENT/Mouth: EAC's clear, TM's nl w/o erythema, bulging. Nares clear w/o erythema, swelling, exudates. Oropharynx clear without erythema or exudates. Oral hygiene is good. Tongue normal, non obstructing. Hearing intact.  Neck: Supple. Thyroid nl. Car 2+/2+ without bruits, nodes or JVD. Chest: Respirations nl with BS clear & equal w/o rales, rhonchi, wheezing or stridor.  Cor: Heart sounds normal w/ regular rate and rhythm without sig. murmurs, gallops, clicks, or rubs.  Peripheral pulses normal and equal  without edema.  Abdomen: Soft & bowel sounds normal. Non-tender w/o guarding, rebound, hernias, masses, or organomegaly.  Lymphatics: Unremarkable.  Musculoskeletal: Full ROM all peripheral extremities, joint stability, 5/5 strength, and normal gait.  Skin: Warm, dry without exposed rashes, lesions or ecchymosis apparent.  Neuro: Cranial nerves intact, reflexes equal bilaterally. Sensory-motor testing grossly intact. Tendon reflexes grossly intact.  Pysch: Alert & oriented x 3.  Insight and judgement nl & appropriate. No ideations.  Assessment and Plan:  1. Essential hypertension  - Continue medication, monitor blood pressure at home. Continue DASH diet. Reminder to go to the ER if any CP, SOB, nausea, dizziness, severe HA, changes vision/speech, left arm numbness and tingling and jaw pain. - TSH  2. Hyperlipidemia  - Continue diet/meds, exercise,& lifestyle modifications. Continue monitor periodic cholesterol/liver & renal functions  - Lipid panel - TSH  3. Prediabetes  - Continue diet, exercise, lifestyle modifications. Monitor appropriate labs. - Hemoglobin A1c - Insulin, random  4. Vitamin D deficiency  - Continue supplementation. - VITAMIN D 25 Hydroxy  5. Medication management  - CBC with Differential/Platelet - BASIC METABOLIC PANEL WITH GFR - Hepatic function panel - Magnesium     Recommended regular exercise, BP monitoring, weight control, and discussed med and SE's. Recommended labs to assess and monitor clinical status. Further disposition pending results of labs. Over 30 minutes of exam, counseling, chart review was performed

## 2015-11-12 LAB — VITAMIN D 25 HYDROXY (VIT D DEFICIENCY, FRACTURES): Vit D, 25-Hydroxy: 43 ng/mL (ref 30–100)

## 2015-11-12 LAB — HEMOGLOBIN A1C
HEMOGLOBIN A1C: 5.1 % (ref <5.7)
Mean Plasma Glucose: 100 mg/dL

## 2015-12-03 ENCOUNTER — Telehealth: Payer: Self-pay | Admitting: *Deleted

## 2015-12-03 NOTE — Telephone Encounter (Signed)
Patient called and reported her BP is elevated at 160/88 and later 158/88.  Per Dr Melford Aase, she should increase her Atenolol 50 mg to 2 tablets daily and check her BP.  If it remains over 145/, she needs to call and get further instructions.  Patient is aware.

## 2015-12-07 ENCOUNTER — Other Ambulatory Visit: Payer: Self-pay

## 2015-12-07 ENCOUNTER — Other Ambulatory Visit: Payer: Self-pay | Admitting: Internal Medicine

## 2015-12-07 MED ORDER — ATENOLOL 50 MG PO TABS: 50.0000 mg | ORAL_TABLET | Freq: Every day | ORAL | 1 refills | Status: DC

## 2015-12-07 MED ORDER — BISOPROLOL-HYDROCHLOROTHIAZIDE 5-6.25 MG PO TABS: 1.0000 | ORAL_TABLET | Freq: Every day | ORAL | 1 refills | Status: DC

## 2016-04-29 ENCOUNTER — Other Ambulatory Visit: Payer: Self-pay | Admitting: Internal Medicine

## 2016-04-29 DIAGNOSIS — Z1231 Encounter for screening mammogram for malignant neoplasm of breast: Secondary | ICD-10-CM

## 2016-05-09 ENCOUNTER — Encounter: Payer: Self-pay | Admitting: Physician Assistant

## 2016-05-23 DIAGNOSIS — M65312 Trigger thumb, left thumb: Secondary | ICD-10-CM | POA: Diagnosis not present

## 2016-05-23 DIAGNOSIS — M1812 Unilateral primary osteoarthritis of first carpometacarpal joint, left hand: Secondary | ICD-10-CM | POA: Diagnosis not present

## 2016-05-23 DIAGNOSIS — M25532 Pain in left wrist: Secondary | ICD-10-CM | POA: Diagnosis not present

## 2016-05-23 DIAGNOSIS — M67432 Ganglion, left wrist: Secondary | ICD-10-CM | POA: Diagnosis not present

## 2016-05-23 DIAGNOSIS — G5602 Carpal tunnel syndrome, left upper limb: Secondary | ICD-10-CM | POA: Diagnosis not present

## 2016-05-31 ENCOUNTER — Ambulatory Visit
Admission: RE | Admit: 2016-05-31 | Discharge: 2016-05-31 | Disposition: A | Discharge status: Home / Self Care | Payer: PPO | Source: Ambulatory Visit | Attending: Internal Medicine | Admitting: Internal Medicine

## 2016-05-31 ENCOUNTER — Other Ambulatory Visit: Payer: Self-pay | Admitting: Internal Medicine

## 2016-05-31 DIAGNOSIS — Z1231 Encounter for screening mammogram for malignant neoplasm of breast: Secondary | ICD-10-CM | POA: Diagnosis not present

## 2016-06-08 ENCOUNTER — Encounter: Payer: Self-pay | Admitting: Physician Assistant

## 2016-06-08 ENCOUNTER — Ambulatory Visit (INDEPENDENT_AMBULATORY_CARE_PROVIDER_SITE_OTHER): Payer: PPO | Admitting: Physician Assistant

## 2016-06-08 VITALS — BP 140/80 | HR 64 | Temp 97.3°F | Resp 16 | Ht 62.5 in | Wt 147.0 lb

## 2016-06-08 DIAGNOSIS — R7303 Prediabetes: Secondary | ICD-10-CM

## 2016-06-08 DIAGNOSIS — E785 Hyperlipidemia, unspecified: Secondary | ICD-10-CM | POA: Diagnosis not present

## 2016-06-08 DIAGNOSIS — Z136 Encounter for screening for cardiovascular disorders: Secondary | ICD-10-CM | POA: Diagnosis not present

## 2016-06-08 DIAGNOSIS — Z0000 Encounter for general adult medical examination without abnormal findings: Secondary | ICD-10-CM | POA: Diagnosis not present

## 2016-06-08 DIAGNOSIS — R9389 Abnormal findings on diagnostic imaging of other specified body structures: Secondary | ICD-10-CM

## 2016-06-08 DIAGNOSIS — I10 Essential (primary) hypertension: Secondary | ICD-10-CM

## 2016-06-08 DIAGNOSIS — J302 Other seasonal allergic rhinitis: Secondary | ICD-10-CM

## 2016-06-08 DIAGNOSIS — Z79899 Other long term (current) drug therapy: Secondary | ICD-10-CM

## 2016-06-08 DIAGNOSIS — M19019 Primary osteoarthritis, unspecified shoulder: Secondary | ICD-10-CM

## 2016-06-08 DIAGNOSIS — I771 Stricture of artery: Secondary | ICD-10-CM

## 2016-06-08 DIAGNOSIS — E559 Vitamin D deficiency, unspecified: Secondary | ICD-10-CM | POA: Diagnosis not present

## 2016-06-08 DIAGNOSIS — Z0001 Encounter for general adult medical examination with abnormal findings: Secondary | ICD-10-CM

## 2016-06-08 LAB — CBC WITH DIFFERENTIAL/PLATELET
BASOS PCT: 1 %
Basophils Absolute: 73 cells/uL (ref 0–200)
Eosinophils Absolute: 73 cells/uL (ref 15–500)
Eosinophils Relative: 1 %
HEMATOCRIT: 39.1 % (ref 35.0–45.0)
HEMOGLOBIN: 13 g/dL (ref 11.7–15.5)
LYMPHS ABS: 2263 {cells}/uL (ref 850–3900)
Lymphocytes Relative: 31 %
MCH: 30.5 pg (ref 27.0–33.0)
MCHC: 33.2 g/dL (ref 32.0–36.0)
MCV: 91.8 fL (ref 80.0–100.0)
MONO ABS: 511 {cells}/uL (ref 200–950)
MPV: 8.3 fL (ref 7.5–12.5)
Monocytes Relative: 7 %
Neutro Abs: 4380 cells/uL (ref 1500–7800)
Neutrophils Relative %: 60 %
Platelets: 319 10*3/uL (ref 140–400)
RBC: 4.26 MIL/uL (ref 3.80–5.10)
RDW: 14.1 % (ref 11.0–15.0)
WBC: 7.3 10*3/uL (ref 3.8–10.8)

## 2016-06-08 LAB — HEPATIC FUNCTION PANEL
ALK PHOS: 55 U/L (ref 33–130)
ALT: 33 U/L — AB (ref 6–29)
AST: 24 U/L (ref 10–35)
Albumin: 4.5 g/dL (ref 3.6–5.1)
BILIRUBIN INDIRECT: 0.6 mg/dL (ref 0.2–1.2)
Bilirubin, Direct: 0.2 mg/dL (ref <=0.2)
TOTAL PROTEIN: 7.3 g/dL (ref 6.1–8.1)
Total Bilirubin: 0.8 mg/dL (ref 0.2–1.2)

## 2016-06-08 LAB — BASIC METABOLIC PANEL WITH GFR
BUN: 10 mg/dL (ref 7–25)
CALCIUM: 9.7 mg/dL (ref 8.6–10.4)
CO2: 26 mmol/L (ref 20–31)
Chloride: 97 mmol/L — ABNORMAL LOW (ref 98–110)
Creat: 0.77 mg/dL (ref 0.60–0.93)
GFR, EST AFRICAN AMERICAN: 88 mL/min (ref >=60)
GFR, EST NON AFRICAN AMERICAN: 76 mL/min (ref >=60)
Glucose, Bld: 100 mg/dL — ABNORMAL HIGH (ref 65–99)
Potassium: 4.1 mmol/L (ref 3.5–5.3)
SODIUM: 132 mmol/L — AB (ref 135–146)

## 2016-06-08 LAB — LIPID PANEL
Cholesterol: 240 mg/dL — ABNORMAL HIGH (ref <200)
HDL: 86 mg/dL (ref >50)
LDL CALC: 131 mg/dL — AB (ref <100)
Total CHOL/HDL Ratio: 2.8 Ratio (ref <5.0)
Triglycerides: 114 mg/dL (ref <150)
VLDL: 23 mg/dL (ref <30)

## 2016-06-08 LAB — MAGNESIUM: MAGNESIUM: 1.7 mg/dL (ref 1.5–2.5)

## 2016-06-08 LAB — TSH: TSH: 1.44 m[IU]/L

## 2016-06-08 NOTE — Progress Notes (Signed)
CPE and follow up  Assessment:    Essential hypertension  Will monitor BP at home, call if greater than 140/90 - CBC with Differential/Platelet - BASIC METABOLIC PANEL WITH GFR - Hepatic function panel - TSH - Urinalysis, Routine w reflex microscopic - Microalbumin / creatinine urine ratio - EKG 12-Lead   Hyperlipidemia - check lipids, decrease fatty foods, increase activity.  - Lipid panel  Vitamin D deficiency - Vit D  25 hydroxy (rtn osteoporosis monitoring)   Medication management - Magnesium   Shoulder arthritis Remission. S/p replacement   Arthritis Continue OTC meds   Seasonal allergic rhinitis Allergic rhinitis- Allegra OTC, increase H20, allergy hygiene explained.  Encounter for general adult medical examination with abnormal findings Colonoscopy- patient declines a colonoscopy even though the risks and benefits were discussed at length. Colon cancer is 3rd most diagnosed cancer and 2nd leading cause of death in both men and women 33 years of age and older. Patient understands the risk of cancer and death with declining the test however they are willing to do cologuard screening instead. They understand that this is not as sensitive or specific as a colonoscopy and they are still recommended to get a colonoscopy. The cologuard will be sent out to their house.   Tortuous aorta (HCC) Control blood pressure, cholesterol, glucose, increase exercise.     Future Appointments Date Time Provider [place]  06/12/2017 10:00 AM Vicie Mutters, PA-C [facility] None      Subjective:  Michaela Kelley is a 76 y.o. female who presents for CPE.    Her blood pressure has been controlled at home, , today their BP is BP: 140/80, had cortisone shot for trigger finger 2 weeks ago, has been high since than.  She does workout.  She denies chest pain, shortness of breath, dizziness.  She is on cholesterol medication, she is off lipitor due to cramping. Her cholesterol is not  at goal. The cholesterol last visit was:   Lab Results  Component Value Date   CHOL 230 (H) 11/11/2015   HDL 71 11/11/2015   LDLCALC 117 11/11/2015   TRIG 211 (H) 11/11/2015   CHOLHDL 3.2 11/11/2015   She has been working on diet and exercise for prediabetes, and denies paresthesia of the feet, polydipsia, polyuria and visual disturbances. Last A1C in the office was:  Lab Results  Component Value Date   HGBA1C 5.1 11/11/2015  Patient is on Vitamin D supplement.   Lab Results  Component Value Date   VD25OH 43 11/11/2015    Names of Other Physician/Practitioners you currently use: 1. [facility] here for primary care 2. Dr. Celene Squibb, eye doctor, last visit 9 months, cataract sx bilateral, goes back in may 3. Dr. Edsel Petrin, dentist, last visit 2 months ago 4. Dr. Sharlett Iles, GI 5. [place] 6. Dr. Marguerite Olea, derm Patient Care Team: Unk Pinto, MD as PCP - General (Internal Medicine)  Medication Review: Current Outpatient Prescriptions on File Prior to Visit  Medication Sig Dispense Refill  . aspirin EC 81 MG tablet Take 81 mg by mouth daily.      Marland Kitchen b complex vitamins capsule Take 1 capsule by mouth daily.    . bisoprolol-hydrochlorothiazide (ZIAC) 5-6.25 MG tablet TAKE ONE TABLET EACH DAY FOR BLOOD PRESSURE 90 tablet 0  . Cholecalciferol (VITAMIN D PO) Take 2,000 Int'l Units by mouth daily.    Marland Kitchen MAGNESIUM PO Take 250 mg by mouth daily.     . Omega-3 Fatty Acids (FISH OIL)  1000 MG CAPS Take by mouth daily.    Marland Kitchen OVER THE COUNTER MEDICATION 750 mg daily. CURAMED     No current facility-administered medications on file prior to visit.     Current Problems (verified) Patient Active Problem List   Diagnosis Date Noted  . Medication management 10/03/2013  . Seasonal allergic rhinitis   . Prediabetes   . Vitamin D deficiency   . Shoulder arthritis 12/23/2011  . Hyperlipidemia 01/02/2008  . Essential hypertension 01/02/2008   . Abnormal chest x-ray 01/02/2008    Screening Tests Immunization History  Administered Date(s) Administered  . Influenza, High Dose Seasonal PF 03/10/2015  . Pneumococcal Conjugate-13 05/05/2014  . Pneumococcal Polysaccharide-23 06/28/2006  . Td 05/27/2011   Preventative care: Tetanus: 2013 Pneumovax: 2008 Prevnar 13: 2016 Flu vaccine: 2017 Zostavax: declines  Pap: 2011 normal- declines another MGM:05/2016 DEXA: Osteopenia 01/2014- Young Adult T-Score: -1.9 Colonoscopy:2008 neg- due 2018 (Dr. Sharlett Iles)  CT chest 2010 CXR 2017  Allergies Allergies  Allergen Reactions  . Codeine Nausea And Vomiting and Other (See Comments)    Severe headaches (tolerates Dilaudid)  . Hydrocodone-Acetaminophen Nausea And Vomiting and Other (See Comments)    Severe headaches (tolerates Dilaudid)  . Oxycodone-Acetaminophen Nausea And Vomiting and Other (See Comments)    Severe headaches (tolerates Dilaudid)  . Oxycodone-Aspirin Nausea And Vomiting and Other (See Comments)    Severe headaches (tolerates Dilaudid)    SURGICAL HISTORY She  has a past surgical history that includes Knee Arthroplasty (2010); Dilation and curettage of uterus; Tonsillectomy; Back surgery (1999); Total shoulder arthroplasty (02/10/2011); and Total shoulder arthroplasty (12/22/2011). FAMILY HISTORY Her family history includes Cancer in her mother; Heart disease in her brother; Hypertension in her father. SOCIAL HISTORY She  reports that she quit smoking about 24 years ago. Her smoking use included Cigarettes. She has a 5.00 pack-year smoking history. She has never used smokeless tobacco. She reports that she drinks about 4.8 oz of alcohol per week . She reports that she does not use drugs.   Objective:     Blood pressure 140/80, pulse 64, temperature 97.3 F (36.3 C), resp. rate 16, height 5' 2.5" (1.588 m), weight 147 lb (66.7 kg), SpO2 97 %. Body mass index is 26.46 kg/m.  General appearance: alert, no  distress, WD/WN, female HEENT: normocephalic, sclerae anicteric, TMs pearly, nares patent, no discharge or erythema, pharynx normal Oral cavity: MMM, no lesions Neck: supple, no lymphadenopathy, no thyromegaly, no masses Heart: RRR, normal S1, S2, no murmurs Lungs: CTA bilaterally, no wheezes, rhonchi, or rales Abdomen: +bs, soft, non tender, non distended, no masses, no hepatomegaly, no splenomegaly Musculoskeletal: nontender, no swelling, no obvious deformity Extremities: no edema, no cyanosis, no clubbing Pulses: 2+ symmetric, upper and lower extremities, normal cap refill Neurological: alert, oriented x 3, CN2-12 intact, strength normal upper extremities and lower extremities, sensation normal throughout, DTRs 2+ throughout, no cerebellar signs, gait normal Psychiatric: normal affect, behavior normal, pleasant   EKG- sinus brady, no ST changes.      Vicie Mutters, PA-C   06/08/2016

## 2016-06-08 NOTE — Patient Instructions (Signed)
Cinnamon try 1040m a day for antiinflammation  Cologuard is an easy to use noninvasive colon cancer screening test based on the latest advances in stool DNA science.   Colon cancer is 3rd most diagnosed cancer and 2nd leading cause of death in both men and women 568years of age and older despite being one of the most preventable and treatable cancers if found early.  4 of out 5 people diagnosed with colon cancer have NO prior family history.  When caught EARLY 90% of colon cancer is curable.   You have agreed to do a Cologuard screening and have declined a colonoscopy in spite of being explained the risks and benefits of the colonoscopy in detail, including cancer and death. Please understand that this is test not as sensitive or specific as a colonoscopy and you are still recommended to get a colonoscopy.   If you are NOT medicare please call your insurance company and give them these items to see if they will cover it: 1) CPT code, [id number]) Provider is EProbation officer3) [employer] NPI #[id number]) [employer] ID #[id number] Out-of-pocket cost for Cologuard can range from $0 - $649 so please call  You will receive a short call from [facility] center at [employer] when you receive a call they will say they are from [employer]  to confirm your mailing address and give you more information.  When they calll you, it will appear on the caller ID as "[employer]" or in some cases only this number will appear, [phone]   [employer] S[employer] Companieswill ship your collection kit directly to you. You will collect a single stool sample in the privacy of your own home, no special preparation required. You will return the kit via [employer]-paid shipping or pick-up, in the same box it arrived in. Then I will contact you to discuss your results after I receive them from the laboratory.   If you have any questions or concerns, Cologuard  Customer Support Specialist are available 24 hours a day, 7 days a week at [phone] go to [email or web address]

## 2016-06-09 LAB — VITAMIN D 25 HYDROXY (VIT D DEFICIENCY, FRACTURES): VIT D 25 HYDROXY: 62 ng/mL (ref 30–100)

## 2016-06-09 LAB — URINALYSIS, MICROSCOPIC ONLY
Bacteria, UA: NONE SEEN [HPF]
CASTS: NONE SEEN [LPF]
CRYSTALS: NONE SEEN [HPF]
RBC / HPF: NONE SEEN RBC/HPF (ref <=2)
YEAST: NONE SEEN [HPF]

## 2016-06-09 LAB — URINALYSIS, ROUTINE W REFLEX MICROSCOPIC
Bilirubin Urine: NEGATIVE
Glucose, UA: NEGATIVE
KETONES UR: NEGATIVE
NITRITE: NEGATIVE
PH: 7 (ref 5.0–8.0)
Protein, ur: NEGATIVE
Specific Gravity, Urine: 1.012 (ref 1.001–1.035)

## 2016-06-09 LAB — MICROALBUMIN / CREATININE URINE RATIO
Creatinine, Urine: 90 mg/dL (ref 20–320)
MICROALB UR: 0.6 mg/dL
Microalb Creat Ratio: 7 mcg/mg creat (ref <30)

## 2016-06-20 DIAGNOSIS — M6740 Ganglion, unspecified site: Secondary | ICD-10-CM | POA: Diagnosis not present

## 2016-06-20 DIAGNOSIS — M1812 Unilateral primary osteoarthritis of first carpometacarpal joint, left hand: Secondary | ICD-10-CM | POA: Diagnosis not present

## 2016-06-20 DIAGNOSIS — Z1212 Encounter for screening for malignant neoplasm of rectum: Secondary | ICD-10-CM | POA: Diagnosis not present

## 2016-06-20 DIAGNOSIS — G5602 Carpal tunnel syndrome, left upper limb: Secondary | ICD-10-CM | POA: Diagnosis not present

## 2016-06-20 DIAGNOSIS — Z1211 Encounter for screening for malignant neoplasm of colon: Secondary | ICD-10-CM | POA: Diagnosis not present

## 2016-06-20 DIAGNOSIS — M65312 Trigger thumb, left thumb: Secondary | ICD-10-CM | POA: Diagnosis not present

## 2016-06-21 LAB — COLOGUARD

## 2016-06-27 ENCOUNTER — Telehealth: Payer: Self-pay | Admitting: Physician Assistant

## 2016-06-27 NOTE — Telephone Encounter (Signed)
LVM for pt to return office call for COLOGUARD result.

## 2016-06-27 NOTE — Telephone Encounter (Signed)
Patient with positive cologuard, will refer for colonoscopy with GI  Need to call [place] office, last colonoscopy 2008 with Dr. Sharlett Iles, he has retired.

## 2016-06-28 NOTE — Telephone Encounter (Signed)
Spoke with pt about COLOGUARD results & pt agreed to contact 's office to follow up with GI.

## 2016-07-05 ENCOUNTER — Encounter: Payer: Self-pay | Admitting: Internal Medicine

## 2016-07-14 ENCOUNTER — Encounter: Payer: Self-pay | Admitting: *Deleted

## 2016-07-27 ENCOUNTER — Encounter: Payer: Self-pay | Admitting: Internal Medicine

## 2016-08-01 DIAGNOSIS — Z961 Presence of intraocular lens: Secondary | ICD-10-CM | POA: Diagnosis not present

## 2016-08-01 DIAGNOSIS — H35372 Puckering of macula, left eye: Secondary | ICD-10-CM | POA: Diagnosis not present

## 2016-08-31 ENCOUNTER — Other Ambulatory Visit: Payer: Self-pay | Admitting: Internal Medicine

## 2016-09-07 ENCOUNTER — Encounter: Payer: Self-pay | Admitting: Internal Medicine

## 2016-09-07 ENCOUNTER — Ambulatory Visit (AMBULATORY_SURGERY_CENTER): Payer: PPO

## 2016-09-07 VITALS — Ht 62.5 in | Wt 146.0 lb

## 2016-09-07 DIAGNOSIS — R195 Other fecal abnormalities: Secondary | ICD-10-CM

## 2016-09-07 MED ORDER — NA SULFATE-K SULFATE-MG SULF 17.5-3.13-1.6 GM/177ML PO SOLN: 1.0000 | Freq: Once | ORAL | 0 refills | Status: AC

## 2016-09-07 NOTE — Progress Notes (Signed)
Denies allergies to eggs or soy products. Denies complication of anesthesia or sedation. Denies use of weight loss medication. Denies use of O2.   Emmi instructions declined.  

## 2016-09-21 ENCOUNTER — Ambulatory Visit (AMBULATORY_SURGERY_CENTER): Payer: PPO | Admitting: Internal Medicine

## 2016-09-21 ENCOUNTER — Encounter: Payer: Self-pay | Admitting: Internal Medicine

## 2016-09-21 VITALS — BP 149/68 | HR 58 | Temp 97.8°F | Resp 13 | Ht 62.5 in | Wt 146.0 lb

## 2016-09-21 DIAGNOSIS — D124 Benign neoplasm of descending colon: Secondary | ICD-10-CM

## 2016-09-21 DIAGNOSIS — R195 Other fecal abnormalities: Secondary | ICD-10-CM

## 2016-09-21 DIAGNOSIS — Z1211 Encounter for screening for malignant neoplasm of colon: Secondary | ICD-10-CM | POA: Diagnosis not present

## 2016-09-21 MED ORDER — SODIUM CHLORIDE 0.9 % IV SOLN: 500.0000 mL | INTRAVENOUS | Status: DC

## 2016-09-21 NOTE — Op Note (Signed)
[facility] Patient Name: Boston Catarino Procedure Date: 09/21/2016 9:22 AM MRN: [id number] Endoscopist: Jerene Bears , MD Age: 76 Referring MD:  Date of Birth: 10/23/1940 Gender: Female Account #: [id number] Procedure:                Colonoscopy Indications:              Positive Cologuard test Medicines:                Monitored Anesthesia Care Procedure:                Pre-Anesthesia Assessment:                           - Prior to the procedure, a History and Physical                            was performed, and patient medications and                            allergies were reviewed. The patient's tolerance of                            previous anesthesia was also reviewed. The risks                            and benefits of the procedure and the sedation                            options and risks were discussed with the patient.                            All questions were answered, and informed consent                            was obtained. Prior Anticoagulants: The patient has                            taken no previous anticoagulant or antiplatelet                            agents. ASA Grade Assessment: II - A patient with                            mild systemic disease. After reviewing the risks                            and benefits, the patient was deemed in                            satisfactory condition to undergo the procedure.                           After obtaining informed consent, the colonoscope  was passed under direct vision. Throughout the                            procedure, the patient's blood pressure, pulse, and                            oxygen saturations were monitored continuously. The                            Colonoscope was introduced through the anus and                            advanced to the the cecum, identified by                            appendiceal orifice and ileocecal valve. The                        quality of the bowel preparation was good. The                            ileocecal valve, appendiceal orifice, and rectum                            were photographed. The colonoscopy was performed                            with difficulty due to multiple diverticula in the                            colon. Successful completion of the procedure was                            aided by changing the patient to a supine position,                            using manual pressure and withdrawing the scope and                            replacing with the adult endoscope. Scope In: 9:41:12 AM Scope Out: 10:03:29 AM Scope Withdrawal Time: 0 hours 12 minutes 37 seconds  Total Procedure Duration: 0 hours 22 minutes 17 seconds  Findings:                 The digital rectal exam was normal.                           Multiple small and large-mouthed diverticula were                            found in the recto-sigmoid colon, sigmoid colon,                            hepatic flexure and ascending colon. There was  narrowing of the colon in association with the                            diverticular opening.                           A 3 mm polyp was found in the descending colon. The                            polyp was sessile. The polyp was removed with a                            cold biopsy forceps. Resection and retrieval were                            complete.                           Internal hemorrhoids were found during                            retroflexion. The hemorrhoids were small. Complications:            No immediate complications. Estimated Blood Loss:     Estimated blood loss was minimal. Impression:               - Severe diverticulosis in the recto-sigmoid colon,                            in the sigmoid colon, at the hepatic flexure and in                            the ascending colon. There was narrowing of the                             colon in association with the diverticular opening.                           - One 3 mm polyp in the descending colon, removed                            with a cold biopsy forceps. Resected and retrieved.                           - Internal hemorrhoids. Recommendation:           - Patient has a contact number available for                            emergencies. The signs and symptoms of potential                            delayed complications were discussed with the  patient. Return to normal activities tomorrow.                            Written discharge instructions were provided to the                            patient.                           - Resume previous diet.                           - Continue present medications.                           - Await pathology results.                           - Repeat colonoscopy is recommended. The                            colonoscopy date will be determined after pathology                            results from today's exam become available for                            review. Jerene Bears, MD 09/21/2016 10:09:32 AM This report has been signed electronically.

## 2016-09-21 NOTE — Patient Instructions (Signed)
YOU HAD AN ENDOSCOPIC PROCEDURE TODAY AT THE [facility]:   Refer to the procedure report that was given to you for any specific questions about what was found during the examination.  If the procedure report does not answer your questions, please call your gastroenterologist to clarify.  If you requested that your care partner not be given the details of your procedure findings, then the procedure report has been included in a sealed envelope for you to review at your convenience later.  YOU SHOULD EXPECT: Some feelings of bloating in the abdomen. Passage of more gas than usual.  Walking can help get rid of the air that was put into your GI tract during the procedure and reduce the bloating. If you had a lower endoscopy (such as a colonoscopy or flexible sigmoidoscopy) you may notice spotting of blood in your stool or on the toilet paper. If you underwent a bowel prep for your procedure, you may not have a normal bowel movement for a few days.  Please Note:  You might notice some irritation and congestion in your nose or some drainage.  This is from the oxygen used during your procedure.  There is no need for concern and it should clear up in a day or so.  SYMPTOMS TO REPORT IMMEDIATELY:   Following lower endoscopy (colonoscopy or flexible sigmoidoscopy):  Excessive amounts of blood in the stool  Significant tenderness or worsening of abdominal pains  Swelling of the abdomen that is new, acute  Fever of 100F or higher  For urgent or emergent issues, a gastroenterologist can be reached at any hour by calling [phone].   DIET:  We do recommend a small meal at first, but then you may proceed to your regular diet.  Drink plenty of fluids but you should avoid alcoholic beverages for 24 hours.  MEDICATIONS: Continue present medications.  Please see handouts given to you by your recovery nurse.  ACTIVITY:  You should plan to take it easy for the rest of today and you should NOT  DRIVE or use heavy machinery until tomorrow (because of the sedation medicines used during the test).    FOLLOW UP: Our staff will call the number listed on your records the next business day following your procedure to check on you and address any questions or concerns that you may have regarding the information given to you following your procedure. If we do not reach you, we will leave a message.  However, if you are feeling well and you are not experiencing any problems, there is no need to return our call.  We will assume that you have returned to your regular daily activities without incident.  If any biopsies were taken you will be contacted by phone or by letter within the next 1-3 weeks.  Please call us at [phone] if you have not heard about the biopsies in 3 weeks.   Thank you for allowing us to provide for your healthcare needs today.   SIGNATURES/CONFIDENTIALITY: You and/or your care partner have signed paperwork which will be entered into your electronic medical record.  These signatures attest to the fact that that the information above on your After Visit Summary has been reviewed and is understood.  Full responsibility of the confidentiality of this discharge information lies with you and/or your care-partner. 

## 2016-09-21 NOTE — Progress Notes (Signed)
A and O x3. Report to RN. Tolerated MAC anesthesia well.

## 2016-09-21 NOTE — Progress Notes (Signed)
Called to room to assist during endoscopic procedure.  Patient ID and intended procedure confirmed with present staff. Received instructions for my participation in the procedure from the performing physician.  

## 2016-09-21 NOTE — Progress Notes (Signed)
Pt's states no medical or surgical changes since previsit or office visit. 

## 2016-09-22 ENCOUNTER — Telehealth: Payer: Self-pay

## 2016-09-22 NOTE — Telephone Encounter (Signed)
  Follow up Call-  Call back number 09/21/2016  Post procedure Call Back phone  # [phone]  Permission to leave phone message Yes  Some recent data might be hidden     Patient questions:  Do you have a fever, pain , or abdominal swelling? No. Pain Score  0 *  Have you tolerated food without any problems? Yes.    Have you been able to return to your normal activities? Yes.    Do you have any questions about your discharge instructions: Diet   No. Medications  No. Follow up visit  No.  Do you have questions or concerns about your Care? No.  Actions: * If pain score is 4 or above: No action needed, pain <4.  No problems noted per pt.  Pt wanted to thank [place] all for the great care she received.  She commented how clean the facility was.  She appreciate Dr. Hilarie Fredrickson being on time for her procedure.  And lastly, she really liked the handouts of pt education that she received.  maw

## 2016-09-30 ENCOUNTER — Encounter: Payer: Self-pay | Admitting: Internal Medicine

## 2016-10-08 DIAGNOSIS — G8929 Other chronic pain: Secondary | ICD-10-CM | POA: Diagnosis not present

## 2016-10-08 DIAGNOSIS — M79672 Pain in left foot: Secondary | ICD-10-CM | POA: Diagnosis not present

## 2016-10-20 DIAGNOSIS — M79672 Pain in left foot: Secondary | ICD-10-CM | POA: Diagnosis not present

## 2016-10-20 DIAGNOSIS — G8929 Other chronic pain: Secondary | ICD-10-CM | POA: Diagnosis not present

## 2016-10-26 DIAGNOSIS — M79672 Pain in left foot: Secondary | ICD-10-CM | POA: Diagnosis not present

## 2016-10-26 DIAGNOSIS — G8929 Other chronic pain: Secondary | ICD-10-CM | POA: Diagnosis not present

## 2016-12-02 DIAGNOSIS — M1711 Unilateral primary osteoarthritis, right knee: Secondary | ICD-10-CM | POA: Diagnosis not present

## 2016-12-13 ENCOUNTER — Ambulatory Visit: Payer: Self-pay | Admitting: Internal Medicine

## 2017-01-10 ENCOUNTER — Encounter: Payer: Self-pay | Admitting: Internal Medicine

## 2017-01-10 ENCOUNTER — Ambulatory Visit (INDEPENDENT_AMBULATORY_CARE_PROVIDER_SITE_OTHER): Payer: PPO | Admitting: Internal Medicine

## 2017-01-10 VITALS — BP 136/80 | HR 64 | Temp 97.5°F | Resp 16 | Ht 62.5 in | Wt 140.4 lb

## 2017-01-10 DIAGNOSIS — Z23 Encounter for immunization: Secondary | ICD-10-CM

## 2017-01-10 DIAGNOSIS — I10 Essential (primary) hypertension: Secondary | ICD-10-CM

## 2017-01-10 DIAGNOSIS — Z79899 Other long term (current) drug therapy: Secondary | ICD-10-CM

## 2017-01-10 DIAGNOSIS — E559 Vitamin D deficiency, unspecified: Secondary | ICD-10-CM

## 2017-01-10 DIAGNOSIS — E782 Mixed hyperlipidemia: Secondary | ICD-10-CM

## 2017-01-10 DIAGNOSIS — R7303 Prediabetes: Secondary | ICD-10-CM | POA: Diagnosis not present

## 2017-01-10 NOTE — Patient Instructions (Signed)

## 2017-01-10 NOTE — Progress Notes (Signed)
This very nice 76 y.o. DWF presents for 6 month follow up with Hypertension, Hyperlipidemia, Pre-Diabetes and Vitamin D Deficiency.      Patient is treated for HTN & BP has been controlled at home. Today's BP was at goal - 136/80. Patient has had no complaints of any cardiac type chest pain, palpitations, dyspnea / orthopnea / PND, dizziness, claudication, or dependent edema.     Hyperlipidemia is not controlled with diet & she is statin intolerant. Patient has not been tried on Zetia.  Last Lipids were not at goal: Lab Results  Component Value Date   CHOL 240 (H) 06/08/2016   HDL 86 06/08/2016   LDLCALC 131 (H) 06/08/2016   TRIG 114 06/08/2016   CHOLHDL 2.8 06/08/2016      Also, the patient has been followed expectantly for PreDiabetes and has had no symptoms of reactive hypoglycemia, diabetic polys, paresthesias or visual blurring.  Last A1c was at goal:  Lab Results  Component Value Date   HGBA1C 5.1 11/11/2015      Further, the patient also has history of Vitamin D Deficiency ("37" on treatment in 2010)  and supplements vitamin D without any suspected side-effects. Last vitamin D was at goal: Lab Results  Component Value Date   VD25OH 62 06/08/2016   Current Outpatient Prescriptions on File Prior to Visit  Medication Sig  . aspirin EC 81 MG tablet Take 81 mg by mouth daily.    Marland Kitchen b complex vitamins capsule Take 1 capsule by mouth daily.  . bisoprolol-hydrochlorothiazide (ZIAC) 5-6.25 MG tablet TAKE ONE TABLET EACH DAY BLOOD PRESSURE  . Cholecalciferol (VITAMIN D PO) Take 6,000 Int'l Units by mouth daily.   Marland Kitchen MAGNESIUM PO Take 150 mg by mouth daily.   . Omega-3 Fatty Acids (FISH OIL) 1000 MG CAPS Take by mouth daily.  Marland Kitchen OVER THE COUNTER MEDICATION 750 mg daily. CURAMED   No current facility-administered medications on file prior to visit.    Allergies  Allergen Reactions  . Codeine Nausea And Vomiting and Other (See Comments)    Severe headaches (tolerates Dilaudid)  .  Hydrocodone-Acetaminophen Nausea And Vomiting and Other (See Comments)    Severe headaches (tolerates Dilaudid)  . Oxycodone-Acetaminophen Nausea And Vomiting and Other (See Comments)    Severe headaches (tolerates Dilaudid)  . Oxycodone-Aspirin Nausea And Vomiting and Other (See Comments)    Severe headaches (tolerates Dilaudid)   PMHx:   Past Medical History:  Diagnosis Date  . Allergy   . Arthritis   . Cataract   . GERD (gastroesophageal reflux disease)   . Hx: UTI (urinary tract infection)   . Hyperlipidemia   . Hypertension   . Prediabetes   . Seasonal allergic rhinitis   . Vitamin D deficiency    Immunization History  Administered Date(s) Administered  . Influenza, High Dose Seasonal PF 03/10/2015, 01/10/2017  . Pneumococcal Conjugate-13 05/05/2014  . Pneumococcal Polysaccharide-23 06/28/2006  . Td 05/27/2011   Past Surgical History:  Procedure Laterality Date  . BACK SURGERY  1999   L5   . DILATION AND CURETTAGE OF UTERUS    . KNEE ARTHROPLASTY  2010   L knee  . TONSILLECTOMY    . TOTAL SHOULDER ARTHROPLASTY  02/10/2011   Procedure: TOTAL SHOULDER ARTHROPLASTY;  Surgeon: Metta Clines Supple;  Location: [facility];  Service: Orthopedics;  Laterality: Left;  TOTAL SHOULDER ARTHROPLASTY LEFT SIDE  . TOTAL SHOULDER ARTHROPLASTY  12/22/2011   Procedure: TOTAL SHOULDER ARTHROPLASTY;  Surgeon: Lennette Bihari  Sundra Aland, MD;  Location: [facility];  Service: Orthopedics;  Laterality: Right;  right total shoulder arthroplasty   FHx:    Reviewed / unchanged  SHx:    Reviewed / unchanged  Systems Review:  Constitutional: Denies fever, chills, wt changes, headaches, insomnia, fatigue, night sweats, change in appetite. Eyes: Denies redness, blurred vision, diplopia, discharge, itchy, watery eyes.  ENT: Denies discharge, congestion, post nasal drip, epistaxis, sore throat, earache, hearing loss, dental pain, tinnitus, vertigo, sinus pain, snoring.  CV: Denies chest pain, palpitations, irregular  heartbeat, syncope, dyspnea, diaphoresis, orthopnea, PND, claudication or edema. Respiratory: denies cough, dyspnea, DOE, pleurisy, hoarseness, laryngitis, wheezing.  Gastrointestinal: Denies dysphagia, odynophagia, heartburn, reflux, water brash, abdominal pain or cramps, nausea, vomiting, bloating, diarrhea, constipation, hematemesis, melena, hematochezia  or hemorrhoids. Genitourinary: Denies dysuria, frequency, urgency, nocturia, hesitancy, discharge, hematuria or flank pain. Musculoskeletal: Denies arthralgias, myalgias, stiffness, jt. swelling, pain, limping or strain/sprain.  Skin: Denies pruritus, rash, hives, warts, acne, eczema or change in skin lesion(s). Neuro: No weakness, tremor, incoordination, spasms, paresthesia or pain. Psychiatric: Denies confusion, memory loss or sensory loss. Endo: Denies change in weight, skin or hair change.  Heme/Lymph: No excessive bleeding, bruising or enlarged lymph nodes.  Physical Exam  BP 136/80   Pulse 64   Temp (!) 97.5 F (36.4 C)   Resp 16   Ht 5' 2.5" (1.588 m)   Wt 140 lb 6.4 oz (63.7 kg)   BMI 25.27 kg/m   Appears well nourished, well groomed  and in no distress.  Eyes: PERRLA, EOMs, conjunctiva no swelling or erythema. Sinuses: No frontal/maxillary tenderness ENT/Mouth: EAC's clear, TM's nl w/o erythema, bulging. Nares clear w/o erythema, swelling, exudates. Oropharynx clear without erythema or exudates. Oral hygiene is good. Tongue normal, non obstructing. Hearing intact.  Neck: Supple. Thyroid nl. Car 2+/2+ without bruits, nodes or JVD. Chest: Respirations nl with BS clear & equal w/o rales, rhonchi, wheezing or stridor.  Cor: Heart sounds normal w/ regular rate and rhythm without sig. murmurs, gallops, clicks or rubs. Peripheral pulses normal and equal  without edema.  Abdomen: Soft & bowel sounds normal. Non-tender w/o guarding, rebound, hernias, masses or organomegaly.  Lymphatics: Unremarkable.  Musculoskeletal: Full ROM  all peripheral extremities, joint stability, 5/5 strength and normal gait.  Skin: Warm, dry without exposed rashes, lesions or ecchymosis apparent.  Neuro: Cranial nerves intact, reflexes equal bilaterally. Sensory-motor testing grossly intact. Tendon reflexes grossly intact.  Pysch: Alert & oriented x 3.  Insight and judgement nl & appropriate. No ideations.  Assessment and Plan:  1. Essential hypertension  - Continue medication, monitor blood pressure at home.  - Continue DASH diet. Reminder to go to the ER if any CP,  SOB, nausea, dizziness, severe HA, changes vision/speech.  - CBC with Differential/Platelet - BASIC METABOLIC PANEL WITH GFR - Magnesium - TSH  2. Hyperlipidemia, mixed  - Continue diet/meds, exercise,& lifestyle modifications.  - Continue monitor periodic cholesterol/liver & renal functions   - Hepatic function panel - Lipid panel - TSH  3. Prediabetes  - Continue diet, exercise, lifestyle modifications.  - Monitor appropriate labs.  - Hemoglobin A1c - Insulin, random  4. Vitamin D deficiency  - Continue supplementation.  - VITAMIN D 25 Hydroxy   5. Medication management  - CBC with Differential/Platelet - BASIC METABOLIC PANEL WITH GFR - Hepatic function panel - Magnesium - Lipid panel - TSH - Hemoglobin A1c - Insulin, random - VITAMIN D 25 Hydroxy  6. Need for immunization against influenza  -  Flu vaccine HIGH DOSE PF (Fluzone High dose)          Discussed  regular exercise, BP monitoring, weight control to achieve/maintain BMI less than 25 and discussed med and SE's. Recommended labs to assess and monitor clinical status with further disposition pending results of labs. Over 30 minutes of exam, counseling, chart review was performed.

## 2017-01-11 LAB — HEMOGLOBIN A1C
Hgb A1c MFr Bld: 5.2 % of total Hgb (ref <5.7)
MEAN PLASMA GLUCOSE: 103 (calc)
eAG (mmol/L): 5.7 (calc)

## 2017-01-11 LAB — CBC WITH DIFFERENTIAL/PLATELET
BASOS PCT: 0.7 %
Basophils Absolute: 47 cells/uL (ref 0–200)
EOS PCT: 1 %
Eosinophils Absolute: 67 cells/uL (ref 15–500)
HCT: 33.7 % — ABNORMAL LOW (ref 35.0–45.0)
HEMOGLOBIN: 11.4 g/dL — AB (ref 11.7–15.5)
Lymphs Abs: 2680 cells/uL (ref 850–3900)
MCH: 30.6 pg (ref 27.0–33.0)
MCHC: 33.8 g/dL (ref 32.0–36.0)
MCV: 90.3 fL (ref 80.0–100.0)
MPV: 8.9 fL (ref 7.5–12.5)
Monocytes Relative: 9.4 %
NEUTROS ABS: 3276 {cells}/uL (ref 1500–7800)
Neutrophils Relative %: 48.9 %
PLATELETS: 327 10*3/uL (ref 140–400)
RBC: 3.73 10*6/uL — ABNORMAL LOW (ref 3.80–5.10)
RDW: 12.6 % (ref 11.0–15.0)
Total Lymphocyte: 40 %
WBC mixed population: 630 cells/uL (ref 200–950)
WBC: 6.7 10*3/uL (ref 3.8–10.8)

## 2017-01-11 LAB — HEPATIC FUNCTION PANEL
AG Ratio: 1.8 (calc) (ref 1.0–2.5)
ALT: 33 U/L — ABNORMAL HIGH (ref 6–29)
AST: 26 U/L (ref 10–35)
Albumin: 4.4 g/dL (ref 3.6–5.1)
Alkaline phosphatase (APISO): 69 U/L (ref 33–130)
BILIRUBIN DIRECT: 0.1 mg/dL (ref 0.0–0.2)
BILIRUBIN INDIRECT: 0.5 mg/dL (ref 0.2–1.2)
BILIRUBIN TOTAL: 0.6 mg/dL (ref 0.2–1.2)
Globulin: 2.5 g/dL (calc) (ref 1.9–3.7)
Total Protein: 6.9 g/dL (ref 6.1–8.1)

## 2017-01-11 LAB — BASIC METABOLIC PANEL WITH GFR
BUN: 11 mg/dL (ref 7–25)
CHLORIDE: 97 mmol/L — AB (ref 98–110)
CO2: 28 mmol/L (ref 20–32)
Calcium: 9.6 mg/dL (ref 8.6–10.4)
Creat: 0.86 mg/dL (ref 0.60–0.93)
GFR, EST AFRICAN AMERICAN: 77 mL/min/{1.73_m2} (ref >=60)
GFR, EST NON AFRICAN AMERICAN: 66 mL/min/{1.73_m2} (ref >=60)
Glucose, Bld: 97 mg/dL (ref 65–99)
POTASSIUM: 4.3 mmol/L (ref 3.5–5.3)
Sodium: 132 mmol/L — ABNORMAL LOW (ref 135–146)

## 2017-01-11 LAB — VITAMIN D 25 HYDROXY (VIT D DEFICIENCY, FRACTURES): Vit D, 25-Hydroxy: 62 ng/mL (ref 30–100)

## 2017-01-11 LAB — INSULIN, RANDOM: Insulin: 4.6 u[IU]/mL (ref 2.0–19.6)

## 2017-01-11 LAB — MAGNESIUM: Magnesium: 1.9 mg/dL (ref 1.5–2.5)

## 2017-01-11 LAB — LIPID PANEL
CHOL/HDL RATIO: 2.1 (calc) (ref <5.0)
CHOLESTEROL: 226 mg/dL — AB (ref <200)
HDL: 108 mg/dL (ref >50)
LDL CHOLESTEROL (CALC): 99 mg/dL
NON-HDL CHOLESTEROL (CALC): 118 mg/dL (ref <130)
Triglycerides: 98 mg/dL (ref <150)

## 2017-01-11 LAB — TSH: TSH: 1.4 mIU/L (ref 0.40–4.50)

## 2017-01-12 DIAGNOSIS — M1711 Unilateral primary osteoarthritis, right knee: Secondary | ICD-10-CM | POA: Diagnosis not present

## 2017-01-12 DIAGNOSIS — M25551 Pain in right hip: Secondary | ICD-10-CM | POA: Diagnosis not present

## 2017-01-19 DIAGNOSIS — M1711 Unilateral primary osteoarthritis, right knee: Secondary | ICD-10-CM | POA: Diagnosis not present

## 2017-01-24 DIAGNOSIS — I771 Stricture of artery: Secondary | ICD-10-CM | POA: Insufficient documentation

## 2017-01-24 NOTE — Progress Notes (Signed)
Medicare wellness and follow up  Assessment:    Essential hypertension - continue medications, DASH diet, exercise and monitor at home. Call if greater than 130/80.   Seasonal allergic rhinitis, unspecified trigger -     albuterol (PROAIR HFA) 108 (90 Base) MCG/ACT inhaler; Inhale 2 puffs into the lungs every 6 (six) hours as needed for wheezing or shortness of breath. -     triamcinolone cream (KENALOG) 0.5 %; Apply 1 application topically 2 (two) times daily. -     montelukast (SINGULAIR) 10 MG tablet; Take 1 tablet (10 mg total) by mouth daily.  Hyperlipidemia, unspecified hyperlipidemia type -continue medications, check lipids, decrease fatty foods, increase activity.   Prediabetes Discussed general issues about diabetes pathophysiology and management., Educational material distributed., Suggested low cholesterol diet., Encouraged aerobic exercise., Discussed foot care., Reminded to get yearly retinal exam.  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   Encounter for Medicare annual wellness exam  BMI 25.0-25.9,adult  Seborrheic dermatitis -     triamcinolone cream (KENALOG) 0.5 %; Apply 1 application topically 2 (two) times daily. -     ketoconazole (NIZORAL) 2 % cream; Apply 1 application topically 2 (two) times daily.   Future Appointments Date Time Provider [place]  06/12/2017 10:00 AM Vicie Mutters, PA-C [id number] None    During the course of the visit the patient was educated and counseled about appropriate screening and preventive services including:    Pneumococcal vaccine   Influenza vaccine  Td vaccine  Screening electrocardiogram  Bone densitometry screening  Colorectal cancer screening  Diabetes screening  Glaucoma screening  Nutrition counseling   Advanced directives: requested   Subjective:  Michaela Kelley is a 76 y.o. female who presents  for rash and allergies.     Her blood pressure has been controlled at home, today their BP is BP: 110/82. She does workout.  She denies chest pain, shortness of breath, dizziness.  She has had some SOB, wheezing, cough, played golf yesterday. Has taken zyrtec and not helping. No chest pain. Has rash on right neck, has put cortisone cream on it without help. No itching, some discomfort, has been coming and going x 1 month.  She is on cholesterol medication, she is off lipitor due to cramping. Her cholesterol is not at goal. The cholesterol last visit was:   Lab Results  Component Value Date   CHOL 226 (H) 01/10/2017   HDL 108 01/10/2017   LDLCALC 131 (H) 06/08/2016   TRIG 98 01/10/2017   CHOLHDL 2.1 01/10/2017   She has been working on diet and exercise for prediabetes, and denies paresthesia of the feet, polydipsia, polyuria and visual disturbances. Last A1C in the office was:  Lab Results  Component Value Date   HGBA1C 5.2 01/10/2017  Patient is on Vitamin D supplement.   Lab Results  Component Value Date   VD25OH 62 01/10/2017   BMI is Body mass index is 25.38 kg/m., she is working on diet and exercise. Wt Readings from Last 3 Encounters:  01/25/17 141 lb (64 kg)  01/10/17 140 lb 6.4 oz (63.7 kg)  09/21/16 146 lb (66.2 kg)    Names of Other Physician/Practitioners you currently use: 1. [facility] here for primary care 2. Dr. Celene Squibb, eye doctor, May 2018 3. Dr. Edsel Petrin, dentist, Oct 2018 4. Dr. Sharlett Iles, GI 5. [place] 6. Dr. Marguerite Olea, derm Patient Care Team: Unk Pinto, MD as PCP -  General (Internal Medicine)  Medication Review: Current Outpatient Prescriptions on File Prior to Visit  Medication Sig Dispense Refill  . aspirin EC 81 MG tablet Take 81 mg by mouth daily.      Marland Kitchen b complex vitamins capsule Take 1 capsule by mouth daily.    . bisoprolol-hydrochlorothiazide (ZIAC) 5-6.25 MG tablet TAKE ONE TABLET  EACH DAY BLOOD PRESSURE 90 tablet 1  . Cholecalciferol (VITAMIN D PO) Take 6,000 Int'l Units by mouth daily.     Marland Kitchen MAGNESIUM PO Take 150 mg by mouth daily.     . Omega-3 Fatty Acids (FISH OIL) 1000 MG CAPS Take by mouth daily.    Marland Kitchen OVER THE COUNTER MEDICATION 750 mg daily. CURAMED     No current facility-administered medications on file prior to visit.     Current Problems (verified) Patient Active Problem List   Diagnosis Date Noted  . 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 Medication management 10/03/2013  . Seasonal allergic rhinitis   . Prediabetes   . Vitamin D deficiency   . Hyperlipidemia 01/02/2008  . Essential hypertension 01/02/2008  . Abnormal chest x-ray 01/02/2008    Screening Tests Immunization History  Administered Date(s) Administered  . Influenza, High Dose Seasonal PF 03/10/2015, 01/10/2017  . Pneumococcal Conjugate-13 05/05/2014  . Pneumococcal Polysaccharide-23 06/28/2006  . Td 05/27/2011   Preventative care: Tetanus: 2013 Pneumovax: 2008 Prevnar 13: 2016 Flu vaccine: 2017 Zostavax: declines  Pap: 2011 normal- declines another MGM:05/2016 DEXA: Osteopenia 01/2014- Young Adult T-Score: -1.9 Colonoscopy: 2018 (Dr. Sharlett Iles)  CT chest 2010 CXR 2017  Allergies Allergies  Allergen Reactions  . Codeine Nausea And Vomiting and Other (See Comments)    Severe headaches (tolerates Dilaudid)  . Hydrocodone-Acetaminophen Nausea And Vomiting and Other (See Comments)    Severe headaches (tolerates Dilaudid)  . Oxycodone-Acetaminophen Nausea And Vomiting and Other (See Comments)    Severe headaches (tolerates Dilaudid)  . 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 FAMILY HISTORY Her family history includes Cancer in her mother; Heart disease in her brother; Hypertension in her father. SOCIAL HISTORY She  reports that she quit smoking about 25 years ago. Her smoking use included Cigarettes. She has a 5.00 pack-year smoking history. She has never used smokeless tobacco. She reports that she drinks about 4.8 oz of alcohol per week . She reports that she does not use drugs.  MEDICARE WELLNESS OBJECTIVES: Physical activity: Current Exercise Habits: Home exercise routine, Type of exercise: strength training/weights;walking, Time (Minutes): 30, Frequency (Times/Week): 5, Weekly Exercise (Minutes/Week): 150 Cardiac risk factors: Cardiac Risk Factors include: advanced age (>41men, >49 women);dyslipidemia;hypertension Depression/mood screen:   Depression screen [facility] 2/9 01/25/2017  Decreased Interest 0  Down, Depressed, Hopeless 0  PHQ - 2 Score 0    ADLs:  In your present state of health, do you have any difficulty performing the following activities: 01/25/2017 01/10/2017  Hearing? N N  Vision? N N  Difficulty concentrating or making decisions? N N  Walking or climbing stairs? N N  Dressing or bathing? N N  Doing errands, shopping? N N  Some recent data might be hidden     Cognitive Testing  Alert? Yes  Normal Appearance?Yes  Oriented to person? Yes  Place? Yes   Time? Yes  Recall of three objects?  Yes  Can perform simple calculations? Yes  Displays appropriate judgment?Yes  Can read the correct time from a watch face?Yes  EOL planning: Does Patient Have a Medical Advance Directive?: Yes Type of Advance Directive: Healthcare Power of Attorney, Living will [place] in Chart?: No - copy requested   Objective:     Blood pressure 110/82, pulse 67, temperature (!) 97.5 F (36.4 C), resp. rate 14, height 5' 2.5" (1.588 m), weight 141 lb (64 kg), SpO2 97 %. Body mass index is 25.38 kg/m.  General  appearance: alert, no distress, WD/WN, female HEENT: normocephalic, sclerae anicteric, TMs pearly, nares patent, no discharge or erythema, pharynx normal Oral cavity: MMM, no lesions Neck: supple, no lymphadenopathy, no thyromegaly, no masses Heart: RRR, normal S1, S2, no murmurs Lungs: CTA bilaterally, no wheezes, rhonchi, or rales Abdomen: +bs, soft, non tender, non distended, no masses, no hepatomegaly, no splenomegaly Musculoskeletal: nontender, no swelling, no obvious deformity Extremities: no edema, no cyanosis, no clubbing Pulses: 2+ symmetric, upper and lower extremities, normal cap refill Neurological: alert, oriented x 3, CN2-12 intact, strength normal upper extremities and lower extremities, sensation normal throughout, DTRs 2+ throughout, no cerebellar signs, gait normal Psychiatric: normal affect, behavior normal, pleasant   Medicare Attestation I have personally reviewed: The patient's medical and social history Their use of alcohol, tobacco or illicit drugs Their current medications and supplements The patient's functional ability including ADLs,fall risks, home safety risks, cognitive, and hearing and visual impairment Diet and physical activities Evidence for depression or mood disorders  The patient's weight, height, BMI, and visual acuity have been recorded in the chart.  I have made referrals, counseling, and provided education to the patient based on review of the above and I have provided the patient with a written personalized care plan for preventive services.        Vicie Mutters, PA-C   01/25/2017

## 2017-01-25 ENCOUNTER — Encounter: Payer: Self-pay | Admitting: Physician Assistant

## 2017-01-25 ENCOUNTER — Ambulatory Visit (INDEPENDENT_AMBULATORY_CARE_PROVIDER_SITE_OTHER): Payer: PPO | Admitting: Physician Assistant

## 2017-01-25 VITALS — BP 110/82 | HR 67 | Temp 97.5°F | Resp 14 | Ht 62.5 in | Wt 141.0 lb

## 2017-01-25 DIAGNOSIS — I10 Essential (primary) hypertension: Secondary | ICD-10-CM | POA: Diagnosis not present

## 2017-01-25 DIAGNOSIS — J302 Other seasonal allergic rhinitis: Secondary | ICD-10-CM | POA: Diagnosis not present

## 2017-01-25 DIAGNOSIS — E785 Hyperlipidemia, unspecified: Secondary | ICD-10-CM

## 2017-01-25 DIAGNOSIS — R9389 Abnormal findings on diagnostic imaging of other specified body structures: Secondary | ICD-10-CM | POA: Diagnosis not present

## 2017-01-25 DIAGNOSIS — Z0001 Encounter for general adult medical examination with abnormal findings: Secondary | ICD-10-CM

## 2017-01-25 DIAGNOSIS — I771 Stricture of artery: Secondary | ICD-10-CM

## 2017-01-25 DIAGNOSIS — R7303 Prediabetes: Secondary | ICD-10-CM

## 2017-01-25 DIAGNOSIS — E559 Vitamin D deficiency, unspecified: Secondary | ICD-10-CM | POA: Diagnosis not present

## 2017-01-25 DIAGNOSIS — Z79899 Other long term (current) drug therapy: Secondary | ICD-10-CM | POA: Diagnosis not present

## 2017-01-25 DIAGNOSIS — R6889 Other general symptoms and signs: Secondary | ICD-10-CM

## 2017-01-25 DIAGNOSIS — L219 Seborrheic dermatitis, unspecified: Secondary | ICD-10-CM

## 2017-01-25 DIAGNOSIS — Z0000 Encounter for general adult medical examination without abnormal findings: Secondary | ICD-10-CM

## 2017-01-25 DIAGNOSIS — Z6825 Body mass index (BMI) 25.0-25.9, adult: Secondary | ICD-10-CM

## 2017-01-25 MED ORDER — KETOCONAZOLE 2 % EX CREA: 1.0000 "application " | TOPICAL_CREAM | Freq: Two times a day (BID) | CUTANEOUS | 0 refills | Status: DC

## 2017-01-25 MED ORDER — ALBUTEROL SULFATE HFA 108 (90 BASE) MCG/ACT IN AERS: 2.0000 | INHALATION_SPRAY | Freq: Four times a day (QID) | RESPIRATORY_TRACT | 2 refills | Status: DC | PRN

## 2017-01-25 MED ORDER — MONTELUKAST SODIUM 10 MG PO TABS: 10.0000 mg | ORAL_TABLET | Freq: Every day | ORAL | 2 refills | Status: DC

## 2017-01-25 MED ORDER — TRIAMCINOLONE ACETONIDE 0.5 % EX CREA: 1.0000 "application " | TOPICAL_CREAM | Freq: Two times a day (BID) | CUTANEOUS | 2 refills | Status: DC

## 2017-01-25 NOTE — Patient Instructions (Addendum)
Do the cream x 2 weeks both at the same time twice a day Can take the Singulair during allergy season Can do albuterol as needed  Go to the ER if any chest pain, shortness of breath, nausea, dizziness, severe HA, changes vision/speech   Seborrheic Dermatitis, Adult Seborrheic dermatitis is a skin disease that causes red, scaly patches. It usually occurs on the scalp, and it is often called dandruff. The patches may appear on other parts of the body. Skin patches tend to appear where there are many oil glands in the skin. Areas of the body that are commonly affected include:  Scalp.  Skin folds of the body.  Ears.  Eyebrows.  Neck.  Face.  Armpits.  The bearded area of men's faces.  The condition may come and go for no known reason, and it is often long-lasting (chronic). What are the causes? The cause of this condition is not known. What increases the risk? This condition is more likely to develop in people who:  Have certain conditions, such as: ? HIV (human immunodeficiency virus). ? AIDS (acquired immunodeficiency syndrome). ? Parkinson disease. ? Mood disorders, such as depression.  Are 75-3 years old.  What are the signs or symptoms? Symptoms of this condition include:  Thick scales on the scalp.  Redness on the face or in the armpits.  Skin that is flaky. The flakes may be white or yellow.  Skin that seems oily or dry but is not helped with moisturizers.  Itching or burning in the affected areas.  How is this diagnosed? This condition is diagnosed with a medical history and physical exam. A sample of your skin may be tested (skin biopsy). You may need to see a skin specialist (dermatologist). How is this treated? There is no cure for this condition, but treatment can help to manage the symptoms. You may get treatment to remove scales, lower the risk of skin infection, and reduce swelling or itching. Treatment may include:  Creams that reduce swelling  and irritation (steroids).  Creams that reduce skin yeast.  Medicated shampoo, soaps, moisturizing creams, or ointments.  Medicated moisturizing creams or ointments.  Follow these instructions at home:  Apply over-the-counter and prescription medicines only as told by your health care provider.  Use any medicated shampoo, soaps, skin creams, or ointments only as told by your health care provider.  Keep all follow-up visits as told by your health care provider. This is important. Contact a health care provider if:  Your symptoms do not improve with treatment.  Your symptoms get worse.  You have new symptoms. This information is not intended to replace advice given to you by your health care provider. Make sure you discuss any questions you have with your health care provider. Document Released: 03/14/2005 Document Revised: 10/02/2015 Document Reviewed: 07/02/2015 Elsevier Interactive Patient Education  2018 [employer].   Take omeprazole over the counter for 2 weeks, then go to zantac 150-300 mg at night for 2 weeks, then you can stop.  Avoid alcohol, spicy foods, NSAIDS (aleve, ibuprofen) at this time. See foods below.   Food Choices for Gastroesophageal Reflux Disease When you have gastroesophageal reflux disease (GERD), the foods you eat and your eating habits are very important. Choosing the right foods can help ease the discomfort of GERD. WHAT GENERAL GUIDELINES DO I NEED TO FOLLOW?  Choose fruits, vegetables, whole grains, low-fat dairy products, and low-fat meat, fish, and poultry.  Limit fats such as oils, salad dressings, butter, nuts, and  avocado.  Keep a food diary to identify foods that cause symptoms.  Avoid foods that cause reflux. These may be different for different people.  Eat frequent small meals instead of three large meals each day.  Eat your meals slowly, in a relaxed setting.  Limit fried foods.  Cook foods using methods other than  frying.  Avoid drinking alcohol.  Avoid drinking large amounts of liquids with your meals.  Avoid bending over or lying down until 2-3 hours after eating. WHAT FOODS ARE NOT RECOMMENDED? The following are some foods and drinks that may worsen your symptoms: Vegetables Tomatoes. Tomato juice. Tomato and spaghetti sauce. Chili peppers. Onion and garlic. Horseradish. Fruits Oranges, grapefruit, and lemon (fruit and juice). Meats High-fat meats, fish, and poultry. This includes hot dogs, ribs, ham, sausage, salami, and bacon. Dairy Whole milk and chocolate milk. Sour cream. Cream. Butter. Ice cream. Cream cheese.  Beverages Coffee and tea, with or without caffeine. Carbonated beverages or energy drinks. Condiments Hot sauce. Barbecue sauce.  Sweets/Desserts Chocolate and cocoa. Donuts. Peppermint and spearmint. Fats and Oils High-fat foods, including [place] fries and potato chips. Other Vinegar. Strong spices, such as black pepper, white pepper, red pepper, cayenne, curry powder, cloves, ginger, and chili powder.

## 2017-01-26 DIAGNOSIS — M1711 Unilateral primary osteoarthritis, right knee: Secondary | ICD-10-CM | POA: Diagnosis not present

## 2017-02-06 DIAGNOSIS — M1711 Unilateral primary osteoarthritis, right knee: Secondary | ICD-10-CM | POA: Diagnosis not present

## 2017-02-20 ENCOUNTER — Other Ambulatory Visit: Payer: Self-pay | Admitting: Internal Medicine

## 2017-05-05 ENCOUNTER — Encounter: Payer: Self-pay | Admitting: Adult Health

## 2017-05-05 ENCOUNTER — Ambulatory Visit (INDEPENDENT_AMBULATORY_CARE_PROVIDER_SITE_OTHER): Payer: PPO | Admitting: Adult Health

## 2017-05-05 VITALS — BP 110/72 | HR 62 | Temp 97.7°F | Ht 62.5 in | Wt 143.0 lb

## 2017-05-05 DIAGNOSIS — J302 Other seasonal allergic rhinitis: Secondary | ICD-10-CM

## 2017-05-05 NOTE — Patient Instructions (Signed)
Singulair daily, zyrtec or other allergy pill for 2-3 weeks or until symptoms resolve, Flonase daily    Allergies, Adult An allergy is when your body's defense system (immune system) overreacts to an otherwise harmless substance (allergen) that you breathe in or eat or something that touches your skin. When you come into contact with something that you are allergic to, your immune system produces certain proteins (antibodies). These proteins cause cells to release chemicals (histamines) that trigger the symptoms of an allergic reaction. Allergies often affect the nasal passages (allergic rhinitis), eyes (allergic conjunctivitis), skin (atopic dermatitis), and stomach. Allergies can be mild or severe. Allergies cannot spread from person to person (are not contagious). They can develop at any age and may be outgrown. What increases the risk? You may be at greater risk of allergies if other people in your family have allergies. What are the signs or symptoms? Symptoms depend on what type of allergy you have. They may include:  Runny, stuffy nose.  Sneezing.  Itchy mouth, ears, or throat.  Postnasal drip.  Sore throat.  Itchy, red, watery, or puffy eyes.  Skin rash or hives.  Stomach pain.  Vomiting.  Diarrhea.  Bloating.  Wheezing or coughing.  People with a severe allergy to food, medicine, or an insect bite may have a life-threatening allergic reaction (anaphylaxis). Symptoms of anaphylaxis include:  Hives.  Itching.  Flushed face.  Swollen lips, tongue, or mouth.  Tight or swollen throat.  Chest pain or tightness in the chest.  Trouble breathing or shortness of breath.  Rapid heartbeat.  Dizziness or fainting.  Vomiting.  Diarrhea.  Pain in the abdomen.  How is this diagnosed? This condition is diagnosed based on:  Your symptoms.  Your family and medical history.  A physical exam.  You may need to see a health care provider who specializes in  treating allergies (allergist). You may also have tests, including:  Skin tests to see which allergens are causing your symptoms, such as: ? Skin prick test. In this test, your skin is pricked with a tiny needle and exposed to small amounts of possible allergens to see if your skin reacts. ? Intradermal skin test. In this test, a small amount of allergen is injected under your skin to see if your skin reacts. ? Patch test. In this test, a small amount of allergen is placed on your skin and then your skin is covered with a bandage. Your health care provider will check your skin after a couple of days to see if a rash has developed.  Blood tests.  Challenges tests. In this test, you inhale a small amount of allergen by mouth to see if you have an allergic reaction.  You may also be asked to:  Keep a food diary. A food diary is a record of all the foods and drinks you have in a day and any symptoms you experience.  Practice an elimination diet. An elimination diet involves eliminating specific foods from your diet and then adding them back in one by one to find out if a certain food causes an allergic reaction.  How is this treated? Treatment for allergies depends on your symptoms. Treatment may include:  Cold compresses to soothe itching and swelling.  Eye drops.  Nasal sprays.  Using a saline spray or container (neti pot) to flush out the nose (nasal irrigation). These methods can help clear away mucus and keep the nasal passages moist.  Using a humidifier.  Oral antihistamines or other  medicines to block allergic reaction and inflammation.  Skin creams to treat rashes or itching.  Diet changes to eliminate food allergy triggers.  Repeated exposure to tiny amounts of allergens to build up a tolerance and prevent future allergic reactions (immunotherapy). These include: ? Allergy shots. ? Oral treatment. This involves taking small doses of an allergen under the tongue (sublingual  immunotherapy).  Emergency epinephrine injection (auto-injector) in case of an allergic emergency. This is a self-injectable, pre-measured medicine that must be given within the first few minutes of a serious allergic reaction.  Follow these instructions at home:  Avoid known allergens whenever possible.  If you suffer from airborne allergens, wash out your nose daily. You can do this with a saline spray or a neti pot to flush out your nose (nasal irrigation).  Take over-the-counter and prescription medicines only as told by your health care provider.  Keep all follow-up visits as told by your health care provider. This is important.  If you are at risk of a severe allergic reaction (anaphylaxis), keep your auto-injector with you at all times.  If you have ever had anaphylaxis, wear a medical alert bracelet or necklace that states you have a severe allergy. Contact a health care provider if:  Your symptoms do not improve with treatment. Get help right away if:  You have symptoms of anaphylaxis, such as: ? Swollen mouth, tongue, or throat. ? Pain or tightness in your chest. ? Trouble breathing or shortness of breath. ? Dizziness or fainting. ? Severe abdominal pain, vomiting, or diarrhea. This information is not intended to replace advice given to you by your health care provider. Make sure you discuss any questions you have with your health care provider. Document Released: 06/07/2002 Document Revised: 07/13/2016 Document Reviewed: 09/30/2015 Elsevier Interactive Patient Education  [employer].

## 2017-05-05 NOTE — Progress Notes (Signed)
Assessment and Plan:  Seasonal allergic rhinitis, unspecified trigger Exam unremarkable; reassured; ear fullness and mild dizziness likely related to allergies and associated inflammation singulair daily, start flonase - discussed short course of low dose steroids for ear fullness - declines at this time May add zyrtec or other OTC allergy medication until symptoms resolve, increase H20, allergy hygiene explained. She is to contact back should she experience new symptoms with dizziness  patient to go to ER if there is weakness, thunderclap headache, visual changes, or any concerning factors  Further disposition pending results of labs. Discussed med's effects and SE's.   Over 15 minutes of exam, counseling, chart review, and critical decision making was performed.   Future Appointments  Date Time Provider [place]  06/12/2017 10:00 AM Vicie Mutters, PA-C [facility] None    ------------------------------------------------------------------------------------------------------------------   HPI BP 110/72   Pulse 62   Temp 97.7 F (36.5 C)   Ht 5' 2.5" (1.588 m)   Wt 143 lb (64.9 kg)   SpO2 98%   BMI 25.74 kg/m   77 y.o.female presents for concerns about bilateral ear discomfort ongoing for 2-3 weeks. She reports it began on R with a mild "fullness" and discomfort, denies pain, then began having bilaterally. She has new hearing aids x 5 months and reports a sensation of "something stuck in my ears" even after removing. She also endorses nasal congestion/runny nose, sneezing. She has + hx of seasonal allergies and takes singulair intermittently - hasn't been taking in recent weeks.   She reports new short episodes of mild dizziness with position changes; noted when she got down on the floor to do some exercises, and when she lay down supine in bed on another occasion. Denies HA, dizziness, paraesthesias, weakness, changes in balance.   Past Medical History:  Diagnosis Date   . Allergy   . Arthritis   . Cataract   . GERD (gastroesophageal reflux disease)   . Hx: UTI (urinary tract infection)   . Hyperlipidemia   . Hypertension   . Prediabetes   . Seasonal allergic rhinitis   . Vitamin D deficiency      Allergies  Allergen Reactions  . Codeine Nausea And Vomiting and Other (See Comments)    Severe headaches (tolerates Dilaudid)  . Hydrocodone-Acetaminophen Nausea And Vomiting and Other (See Comments)    Severe headaches (tolerates Dilaudid)  . Oxycodone-Acetaminophen Nausea And Vomiting and Other (See Comments)    Severe headaches (tolerates Dilaudid)  . Oxycodone-Aspirin Nausea And Vomiting and Other (See Comments)    Severe headaches (tolerates Dilaudid)    Current Outpatient Medications on File Prior to Visit  Medication Sig  . albuterol (PROAIR HFA) 108 (90 Base) MCG/ACT inhaler Inhale 2 puffs into the lungs every 6 (six) hours as needed for wheezing or shortness of breath.  Marland Kitchen aspirin EC 81 MG tablet Take 81 mg by mouth daily.    Marland Kitchen b complex vitamins capsule Take 1 capsule by mouth daily.  . bisoprolol-hydrochlorothiazide (ZIAC) 5-6.25 MG tablet ONE TABLET EVERY DAY FOR BLOOD PRESSURE  . Cholecalciferol (VITAMIN D PO) Take 6,000 Int'l Units by mouth daily.   Marland Kitchen ketoconazole (NIZORAL) 2 % cream Apply 1 application topically 2 (two) times daily.  Marland Kitchen MAGNESIUM PO Take 150 mg by mouth daily.   . montelukast (SINGULAIR) 10 MG tablet Take 1 tablet (10 mg total) by mouth daily.  . Omega-3 Fatty Acids (FISH OIL) 1000 MG CAPS Take by mouth daily.  Marland Kitchen triamcinolone cream (KENALOG) 0.5 % Apply  1 application topically 2 (two) times daily.  . TURMERIC PO Take by mouth.  Marland Kitchen OVER THE COUNTER MEDICATION 750 mg daily. CURAMED   No current facility-administered medications on file prior to visit.     ROS: Review of Systems  Constitutional: Negative for chills, diaphoresis, fever and malaise/fatigue.  HENT: Positive for congestion. Negative for ear discharge,  ear pain, hearing loss, sinus pain, sore throat and tinnitus.   Eyes: Negative for blurred vision, pain, discharge and redness.  Respiratory: Negative for cough, hemoptysis, sputum production, wheezing and stridor.   Cardiovascular: Negative for chest pain, palpitations and orthopnea.  Gastrointestinal: Negative for abdominal pain, diarrhea, nausea and vomiting.  Genitourinary: Negative.   Musculoskeletal: Negative for falls, joint pain and myalgias.  Skin: Negative for rash.  Neurological: Positive for dizziness (Mild x 2 episodes). Negative for sensory change, weakness and headaches.  Endo/Heme/Allergies: Positive for environmental allergies.  Psychiatric/Behavioral: Negative.   All other systems reviewed and are negative.   Physical Exam:  BP 110/72   Pulse 62   Temp 97.7 F (36.5 C)   Ht 5' 2.5" (1.588 m)   Wt 143 lb (64.9 kg)   SpO2 98%   BMI 25.74 kg/m   General Appearance: Well nourished, in no apparent distress. Eyes: PERRLA, EOMs, conjunctiva no swelling or erythema Sinuses: No Frontal/maxillary tenderness ENT/Mouth: Ext aud canals clear, TMs without erythema, bulging. No erythema, swelling, or exudate on post pharynx.  Tonsils not swollen or erythematous. Hearing normal. Nasal turbinates non-injected, pale.  Neck: Supple.  Respiratory: Respiratory effort normal, BS equal bilaterally without rales, rhonchi, wheezing or stridor.  Cardio: RRR with no MRGs. Brisk peripheral pulses without edema.  Lymphatics: Non tender without lymphadenopathy.  Musculoskeletal: Symmetrical strength, normal gait.  Skin: Warm, dry without rashes, lesions, ecchymosis.  Neuro: Cranial nerves intact. Normal muscle tone, no cerebellar symptoms. Sensation intact.  Psych: Awake and oriented X 3, normal affect, Insight and Judgment appropriate.     Izora Ribas, NP 11:31 AM [facility]

## 2017-05-26 ENCOUNTER — Other Ambulatory Visit: Payer: Self-pay | Admitting: Internal Medicine

## 2017-05-26 DIAGNOSIS — Z139 Encounter for screening, unspecified: Secondary | ICD-10-CM

## 2017-06-09 NOTE — Progress Notes (Signed)
Medicare wellness and follow up  Assessment:    Essential hypertension - continue medications, DASH diet, exercise and monitor at home. Call if greater than 130/80.   Seasonal allergic rhinitis, unspecified trigger -     albuterol (PROAIR HFA) 108 (90 Base) MCG/ACT inhaler; Inhale 2 puffs into the lungs every 6 (six) hours as needed for wheezing or shortness of breath. -     triamcinolone cream (KENALOG) 0.5 %; Apply 1 application topically 2 (two) times daily. -     montelukast (SINGULAIR) 10 MG tablet; Take 1 tablet (10 mg total) by mouth daily.  Hyperlipidemia, unspecified hyperlipidemia type -continue medications, check lipids, decrease fatty foods, increase activity.   Medication management  Vitamin D deficiency  Tortuous aorta (HCC) Control blood pressure, cholesterol, glucose, increase exercise.   Encounter for Medicare annual wellness exam 1 year  Seborrheic dermatitis -    Put the ketoconazole on it twice a day -     ketoconazole (NIZORAL) 2 % cream; Apply 1 application topically 2 (two) times daily.   Future Appointments  Date Time Provider [place]  06/22/2017 11:10 AM [facility] 2 [facility]  12/26/2017 11:00 AM Vicie Mutters, PA-C [facility] None  06/27/2018 10:45 AM Vicie Mutters, PA-C [facility] None    During the course of the visit the patient was educated and counseled about appropriate screening and preventive services including:    Pneumococcal vaccine   Influenza vaccine  Td vaccine  Screening electrocardiogram  Bone densitometry screening  Colorectal cancer screening  Diabetes screening  Glaucoma screening  Nutrition counseling   Advanced directives: requested   Subjective:  Michaela Kelley is a 77 y.o. female who presents for rash and allergies.     Her blood pressure has been controlled at home, today their BP is BP: 132/80. She does workout, walking stretching and golfing.  She denies chest pain, shortness of  breath, dizziness.  Allergies are doing well at this time. .  She is on cholesterol medication, she is off lipitor due to cramping. Her cholesterol is not at goal. The cholesterol last visit was:   Lab Results  Component Value Date   CHOL 226 (H) 01/10/2017   HDL 108 01/10/2017   LDLCALC 99 01/10/2017   TRIG 98 01/10/2017   CHOLHDL 2.1 01/10/2017   She has been working on diet and exercise for prediabetes, and denies paresthesia of the feet, polydipsia, polyuria and visual disturbances. Last A1C in the office was:  Lab Results  Component Value Date   HGBA1C 5.2 01/10/2017  Patient is on Vitamin D supplement.   Lab Results  Component Value Date   VD25OH 62 01/10/2017   BMI is Body mass index is 26.85 kg/m., she is working on diet and exercise. Wt Readings from Last 3 Encounters:  06/12/17 146 lb 12.8 oz (66.6 kg)  05/05/17 143 lb (64.9 kg)  01/25/17 141 lb (64 kg)    Names of Other Physician/Practitioners you currently use: 1. [facility] here for primary care 2. Dr. Celene Squibb, eye doctor, May 2018 has OV in June 3. Dr. Edsel Petrin, dentist, Oct 2018 4. Dr. Sharlett Iles, GI 5. [place] 6. Dr. Marguerite Olea, derm Patient Care Team: Unk Pinto, MD as PCP - General (Internal Medicine)  Medication Review: Current Outpatient Medications on File Prior to Visit  Medication Sig Dispense Refill  . albuterol (PROAIR HFA) 108 (90 Base) MCG/ACT inhaler Inhale 2 puffs into the lungs every 6 (six) hours as needed for wheezing or  shortness of breath. 1 Inhaler 2  . aspirin EC 81 MG tablet Take 81 mg by mouth daily.      Marland Kitchen b complex vitamins capsule Take 1 capsule by mouth daily.    . bisoprolol-hydrochlorothiazide (ZIAC) 5-6.25 MG tablet ONE TABLET EVERY DAY FOR BLOOD PRESSURE 90 tablet 1  . Cholecalciferol (VITAMIN D PO) Take 6,000 Int'l Units by mouth daily.     Marland Kitchen MAGNESIUM PO Take 150 mg by mouth daily.     . montelukast (SINGULAIR) 10 MG  tablet Take 1 tablet (10 mg total) by mouth daily. 30 tablet 2  . Omega-3 Fatty Acids (FISH OIL) 1000 MG CAPS Take by mouth daily.    Marland Kitchen OVER THE COUNTER MEDICATION 750 mg daily. CURAMED    . TURMERIC PO Take by mouth.     No current facility-administered medications on file prior to visit.     Current Problems (verified) Patient Active Problem List   Diagnosis Date Noted  . Tortuous aorta ([place]) 01/24/2017  . Medication management 10/03/2013  . Seasonal allergic rhinitis   . Prediabetes   . Vitamin D deficiency   . Hyperlipidemia 01/02/2008  . Essential hypertension 01/02/2008  . Abnormal chest x-ray 01/02/2008    Screening Tests Immunization History  Administered Date(s) Administered  . Influenza, High Dose Seasonal PF 03/10/2015, 01/10/2017  . Pneumococcal Conjugate-13 05/05/2014  . Pneumococcal Polysaccharide-23 06/28/2006  . Td 05/27/2011   Preventative care: Tetanus: 2013 Pneumovax: 2008 Prevnar 13: 2016 Flu vaccine: 2018 Zostavax: declines  Pap: 2011 normal- declines another MGM:05/2016 scheduled end of this month DEXA: Osteopenia 01/2014- Young Adult T-Score: -1.9 Will GET Colonoscopy: 2018 (Dr. Sharlett Iles)  CT chest 2010 CXR 2017  Allergies Allergies  Allergen Reactions  . Codeine Nausea And Vomiting and Other (See Comments)    Severe headaches (tolerates Dilaudid)  . Hydrocodone-Acetaminophen Nausea And Vomiting and Other (See Comments)    Severe headaches (tolerates Dilaudid)  . Oxycodone-Acetaminophen Nausea And Vomiting and Other (See Comments)    Severe headaches (tolerates Dilaudid)  . Oxycodone-Aspirin Nausea And Vomiting and Other (See Comments)    Severe headaches (tolerates Dilaudid)    SURGICAL HISTORY She  has a past surgical history that includes Knee Arthroplasty (2010); Dilation and curettage of uterus; Tonsillectomy; Back surgery (1999); Total shoulder arthroplasty (02/10/2011); and Total shoulder arthroplasty (12/22/2011). FAMILY  HISTORY Her family history includes Cancer in her mother; Heart disease in her brother; Hypertension in her father. SOCIAL HISTORY She  reports that she quit smoking about 25 years ago. Her smoking use included cigarettes. She has a 5.00 pack-year smoking history. she has never used smokeless tobacco. She reports that she drinks about 4.8 oz of alcohol per week. She reports that she does not use drugs.  MEDICARE WELLNESS OBJECTIVES: Physical activity: Current Exercise Habits: Home exercise routine;Structured exercise class, Type of exercise: strength training/weights;walking;yoga, Time (Minutes): 30, Frequency (Times/Week): 5, Weekly Exercise (Minutes/Week): 150, Intensity: Moderate Cardiac risk factors: Cardiac Risk Factors include: advanced age (>38men, >21 women);hypertension;dyslipidemia Depression/mood screen:   Depression screen [facility] 2/9 06/12/2017  Decreased Interest 0  Down, Depressed, Hopeless 0  PHQ - 2 Score 0    ADLs:  In your present state of health, do you have any difficulty performing the following activities: 06/12/2017 01/25/2017  Hearing? N N  Vision? N N  Difficulty concentrating or making decisions? N N  Walking or climbing stairs? N N  Dressing or bathing? N N  Doing errands, shopping? N N  Some recent data might be  hidden     Cognitive Testing  Alert? Yes  Normal Appearance?Yes  Oriented to person? Yes  Place? Yes   Time? Yes  Recall of three objects?  Yes  Can perform simple calculations? Yes  Displays appropriate judgment?Yes  Can read the correct time from a watch face?Yes  EOL planning: Does Patient Have a Medical Advance Directive?: Yes Type of Advance Directive: Healthcare Power of Attorney, Living will Does patient want to make changes to medical advance directive?: Yes (MAU/Ambulatory/Procedural Areas - Information given)   Objective:     Blood pressure 132/80, pulse 78, temperature 97.6 F (36.4 C), resp. rate 14, height 5\' 2"  (1.575 m), weight  146 lb 12.8 oz (66.6 kg), SpO2 96 %. Body mass index is 26.85 kg/m.  General appearance: alert, no distress, WD/WN, female HEENT: normocephalic, sclerae anicteric, TMs pearly, nares patent, no discharge or erythema, pharynx normal Oral cavity: MMM, no lesions Neck: supple, no lymphadenopathy, no thyromegaly, no masses Heart: RRR, normal S1, S2, no murmurs Lungs: CTA bilaterally, no wheezes, rhonchi, or rales Abdomen: +bs, soft, non tender, non distended, no masses, no hepatomegaly, no splenomegaly Musculoskeletal: nontender, no swelling, no obvious deformity Extremities: no edema, no cyanosis, no clubbing Pulses: 2+ symmetric, upper and lower extremities, normal cap refill Neurological: alert, oriented x 3, CN2-12 intact, strength normal upper extremities and lower extremities, sensation normal throughout, DTRs 2+ throughout, no cerebellar signs, gait normal Psychiatric: normal affect, behavior normal, pleasant   Medicare Attestation I have personally reviewed: The patient's medical and social history Their use of alcohol, tobacco or illicit drugs Their current medications and supplements The patient's functional ability including ADLs,fall risks, home safety risks, cognitive, and hearing and visual impairment Diet and physical activities Evidence for depression or mood disorders  The patient's weight, height, BMI, and visual acuity have been recorded in the chart.  I have made referrals, counseling, and provided education to the patient based on review of the above and I have provided the patient with a written personalized care plan for preventive services.        Vicie Mutters, PA-C   06/12/2017

## 2017-06-12 ENCOUNTER — Encounter: Payer: Self-pay | Admitting: Physician Assistant

## 2017-06-12 ENCOUNTER — Ambulatory Visit (INDEPENDENT_AMBULATORY_CARE_PROVIDER_SITE_OTHER): Payer: PPO | Admitting: Physician Assistant

## 2017-06-12 VITALS — BP 132/80 | HR 78 | Temp 97.6°F | Resp 14 | Ht 62.0 in | Wt 146.8 lb

## 2017-06-12 DIAGNOSIS — R9389 Abnormal findings on diagnostic imaging of other specified body structures: Secondary | ICD-10-CM | POA: Diagnosis not present

## 2017-06-12 DIAGNOSIS — J302 Other seasonal allergic rhinitis: Secondary | ICD-10-CM

## 2017-06-12 DIAGNOSIS — Z0001 Encounter for general adult medical examination with abnormal findings: Secondary | ICD-10-CM

## 2017-06-12 DIAGNOSIS — M8580 Other specified disorders of bone density and structure, unspecified site: Secondary | ICD-10-CM | POA: Diagnosis not present

## 2017-06-12 DIAGNOSIS — Z79899 Other long term (current) drug therapy: Secondary | ICD-10-CM | POA: Diagnosis not present

## 2017-06-12 DIAGNOSIS — I10 Essential (primary) hypertension: Secondary | ICD-10-CM | POA: Diagnosis not present

## 2017-06-12 DIAGNOSIS — Z0000 Encounter for general adult medical examination without abnormal findings: Secondary | ICD-10-CM

## 2017-06-12 DIAGNOSIS — R6889 Other general symptoms and signs: Secondary | ICD-10-CM

## 2017-06-12 DIAGNOSIS — E785 Hyperlipidemia, unspecified: Secondary | ICD-10-CM

## 2017-06-12 DIAGNOSIS — Z6826 Body mass index (BMI) 26.0-26.9, adult: Secondary | ICD-10-CM

## 2017-06-12 DIAGNOSIS — I771 Stricture of artery: Secondary | ICD-10-CM

## 2017-06-12 DIAGNOSIS — E559 Vitamin D deficiency, unspecified: Secondary | ICD-10-CM | POA: Diagnosis not present

## 2017-06-12 NOTE — Patient Instructions (Addendum)
VENOUS INSUFFICIENCY Our lower leg venous system is not the most reliable, the heart does NOT pump fluid up, there is a valve system.  The muscles of the leg squeeze and the blood moves up and a valve opens and close, then they squeeze, blood moves up and valves open and closes keeping the blood moving towards the heart.  Lots can go wrong with this valve system.  If someone is sitting or standing without movement, everyone will get swelling.  THINGS TO DO:  Do not stand or sit in one position for long periods of time. Do not sit with your legs crossed. Rest with your legs raised during the day.  Your legs have to be higher than your heart so that gravity will force the valves to open, so please really elevate your legs.   Wear elastic stockings or support hose. Do not wear other tight, encircling garments around the legs, pelvis, or waist.  [employer]  has a wide variety of well priced compression stockings. [place], [place] [address] [id number] [place] has a good cheap selection, I like the socks, they are not as hard to get on  Walk as much as possible to increase blood flow.  Raise the foot of your bed at night with 2-inch blocks.  SEEK MEDICAL CARE IF:   The skin around your ankle starts to break down.  You have pain, redness, tenderness, or hard swelling developing in your leg over a vein.  You are uncomfortable due to leg pain.  If you ever have shortness of breath with exertion or chest pain go to the ER.    Do warm compresses twice a day for 1 week  Put the ketoconazole on your lip twice a day If not better follow up with derm

## 2017-06-22 ENCOUNTER — Ambulatory Visit: Payer: PPO

## 2017-07-06 ENCOUNTER — Ambulatory Visit
Admission: RE | Admit: 2017-07-06 | Discharge: 2017-07-06 | Disposition: A | Discharge status: Home / Self Care | Payer: PPO | Source: Ambulatory Visit | Attending: Internal Medicine | Admitting: Internal Medicine

## 2017-07-06 ENCOUNTER — Ambulatory Visit
Admission: RE | Admit: 2017-07-06 | Discharge: 2017-07-06 | Disposition: A | Discharge status: Home / Self Care | Payer: PPO | Source: Ambulatory Visit | Attending: Physician Assistant | Admitting: Physician Assistant

## 2017-07-06 DIAGNOSIS — Z139 Encounter for screening, unspecified: Secondary | ICD-10-CM

## 2017-07-06 DIAGNOSIS — M8580 Other specified disorders of bone density and structure, unspecified site: Secondary | ICD-10-CM

## 2017-07-06 DIAGNOSIS — M85851 Other specified disorders of bone density and structure, right thigh: Secondary | ICD-10-CM | POA: Diagnosis not present

## 2017-07-06 DIAGNOSIS — Z780 Asymptomatic menopausal state: Secondary | ICD-10-CM | POA: Diagnosis not present

## 2017-07-06 DIAGNOSIS — M810 Age-related osteoporosis without current pathological fracture: Secondary | ICD-10-CM | POA: Diagnosis not present

## 2017-07-06 DIAGNOSIS — Z1231 Encounter for screening mammogram for malignant neoplasm of breast: Secondary | ICD-10-CM | POA: Diagnosis not present

## 2017-07-12 DIAGNOSIS — L570 Actinic keratosis: Secondary | ICD-10-CM | POA: Diagnosis not present

## 2017-07-12 DIAGNOSIS — L821 Other seborrheic keratosis: Secondary | ICD-10-CM | POA: Diagnosis not present

## 2017-08-15 ENCOUNTER — Other Ambulatory Visit: Payer: Self-pay | Admitting: Internal Medicine

## 2017-08-30 DIAGNOSIS — M1711 Unilateral primary osteoarthritis, right knee: Secondary | ICD-10-CM | POA: Diagnosis not present

## 2017-08-30 DIAGNOSIS — M25561 Pain in right knee: Secondary | ICD-10-CM | POA: Diagnosis not present

## 2017-09-06 DIAGNOSIS — M1711 Unilateral primary osteoarthritis, right knee: Secondary | ICD-10-CM | POA: Diagnosis not present

## 2017-09-06 DIAGNOSIS — M25561 Pain in right knee: Secondary | ICD-10-CM | POA: Diagnosis not present

## 2017-09-13 DIAGNOSIS — M1711 Unilateral primary osteoarthritis, right knee: Secondary | ICD-10-CM | POA: Diagnosis not present

## 2017-09-13 DIAGNOSIS — M25561 Pain in right knee: Secondary | ICD-10-CM | POA: Diagnosis not present

## 2017-09-15 ENCOUNTER — Other Ambulatory Visit: Payer: Self-pay | Admitting: Physician Assistant

## 2017-10-02 DIAGNOSIS — H26491 Other secondary cataract, right eye: Secondary | ICD-10-CM | POA: Diagnosis not present

## 2017-10-02 DIAGNOSIS — H35372 Puckering of macula, left eye: Secondary | ICD-10-CM | POA: Diagnosis not present

## 2017-10-02 DIAGNOSIS — Z961 Presence of intraocular lens: Secondary | ICD-10-CM | POA: Diagnosis not present

## 2017-10-31 ENCOUNTER — Ambulatory Visit (INDEPENDENT_AMBULATORY_CARE_PROVIDER_SITE_OTHER): Payer: PPO | Admitting: Internal Medicine

## 2017-10-31 VITALS — BP 156/84 | HR 64 | Temp 97.3°F | Resp 16 | Ht 62.0 in | Wt 146.8 lb

## 2017-10-31 DIAGNOSIS — Z79899 Other long term (current) drug therapy: Secondary | ICD-10-CM | POA: Diagnosis not present

## 2017-10-31 DIAGNOSIS — R609 Edema, unspecified: Secondary | ICD-10-CM | POA: Diagnosis not present

## 2017-10-31 DIAGNOSIS — E559 Vitamin D deficiency, unspecified: Secondary | ICD-10-CM | POA: Diagnosis not present

## 2017-10-31 DIAGNOSIS — I10 Essential (primary) hypertension: Secondary | ICD-10-CM

## 2017-10-31 MED ORDER — FUROSEMIDE 40 MG PO TABS: ORAL_TABLET | ORAL | 3 refills | Status: DC

## 2017-10-31 MED ORDER — ATENOLOL 50 MG PO TABS: ORAL_TABLET | ORAL | 3 refills | Status: DC

## 2017-10-31 NOTE — Progress Notes (Signed)
Subjective:    Patient ID: Michaela Kelley, female    DOB: 27-Nov-1940, 77 y.o.   MRN: [id number]  HPI   This very nice 77 yo DWF  presents for F/U. She has been followed for HTN, HLD, Pre-DM and Vitamin D Deficiency.   Patient is treated for HTN & BP has been controlled at home. Today's BP was at goal -156/84 - confirmed. Patient has had no complaints of any cardiac type chest pain, palpitations, dyspnea / orthopnea / PND, dizziness, claudication, but c/o dependent edema to the ankles/shin progressively worse as the day progresses.  Hyperlipidemia is controlled with diet & she is statin intolerant. Patient has not been tried on Zetia.  Last Lipids were at goal: Lab Results  Component Value Date   CHOL 226 (H) 01/10/2017   HDL 108 01/10/2017   [place] 99 01/10/2017   TRIG 98 01/10/2017   CHOLHDL 2.1 01/10/2017   Also, the patient has been followed expectantly for PreDiabetes and has had no symptoms of reactive hypoglycemia, diabetic polys, paresthesias or visual blurring.  Last A1c was  at goal:  Lab Results  Component Value Date   HGBA1C 5.2 01/10/2017   Further, the patient also has history of Vitamin D Deficiency ("37/2010)  and supplements vitamin D without any suspected side-effects. Last vitamin D was 63 in Oct 2018 and at goal:.  Medication Sig  . albuterol (PROAIR HFA) 108 (90 Base) MCG/ACT inhaler Inhale 2 puffs into the lungs every 6 (six) hours as needed for wheezing or shortness of breath.  Marland Kitchen aspirin EC 81 MG tablet Take 81 mg by mouth daily.    Marland Kitchen b complex vitamins capsule Take 1 capsule by mouth daily.  . bisoprolol-hydrochlorothiazide (ZIAC) 5-6.25 MG tablet TAKE ONE TABLET EACH DAY FOR BLOOD PRESSURE  . Cholecalciferol (VITAMIN D PO) Take 4,000 Int'l Units by mouth daily.   Marland Kitchen MAGNESIUM PO Take 150 mg by mouth daily.   . montelukast (SINGULAIR) 10 MG tablet TAKE ONE TABLET EACH DAY  . Omega-3 Fatty Acids (FISH OIL) 1000 MG CAPS Take by mouth daily.  . TURMERIC PO Take 800  mg by mouth 2 (two) times daily.   Marland Kitchen OVER THE COUNTER MEDICATION 750 mg daily. CURAMED   Allergies  Allergen Reactions  . Codeine Nausea And Vomiting and Other (See Comments)    Severe headaches (tolerates Dilaudid)  . Hydrocodone-Acetaminophen Nausea And Vomiting and Other (See Comments)    Severe headaches (tolerates Dilaudid)  . Oxycodone-Acetaminophen Nausea And Vomiting and Other (See Comments)    Severe headaches (tolerates Dilaudid)  . Oxycodone-Aspirin Nausea And Vomiting and Other (See Comments)    Severe headaches (tolerates Dilaudid)   Past Medical History:  Diagnosis Date  . Allergy   . Arthritis   . Cataract   . GERD (gastroesophageal reflux disease)   . Hx: UTI (urinary tract infection)   . Hyperlipidemia   . Hypertension   . Prediabetes   . Seasonal allergic rhinitis   . Vitamin D deficiency    Past Surgical History:  Procedure Laterality Date  . BACK SURGERY  1999   L5   . DILATION AND CURETTAGE OF UTERUS    . KNEE ARTHROPLASTY  2010   L knee  . TONSILLECTOMY    . TOTAL SHOULDER ARTHROPLASTY  02/10/2011   Procedure: TOTAL SHOULDER ARTHROPLASTY;  Surgeon: Metta Clines Supple;  Location: [place];  Service: Orthopedics;  Laterality: Left;  TOTAL SHOULDER ARTHROPLASTY LEFT SIDE  . TOTAL SHOULDER ARTHROPLASTY  12/22/2011  Procedure: TOTAL SHOULDER ARTHROPLASTY;  Surgeon: Marin Shutter, MD;  Location: [place];  Service: Orthopedics;  Laterality: Right;  right total shoulder arthroplasty   Review of Systems   10 point systems review negative except as above.    Objective:   Physical Exam  BP (!) 156/84   Pulse 64   Temp (!) 97.3 F (36.3 C)   Resp 16   Ht 5\' 2"  (1.575 m)   Wt 146 lb 12.8 oz (66.6 kg)   BMI 26.85 kg/m   HEENT - WNL. Neck - supple.  Chest - Clear equal BS. Cor - Nl HS. RRR w/o sig MGR. PP 1+/1+. 1+ pretibial , ankle & pedal edema edema. MS- FROM w/o deformities.  Gait Nl. Neuro -  Nl w/o focal abnormalities.    Assessment & Plan:   1.  Essential hypertension  - CBC with Differential/Platelet - COMPLETE METABOLIC PANEL WITH GFR - Magnesium - TSH  - D/c Ziac 7 replace with  - atenolol (TENORMIN) 50 MG tablet; Take 1 tablet daily for BP  Dispense: 90 tablet; Refill: 3 - furosemide (LASIX) 40 MG tablet; Take 1/2 to 1 tablet daily for BP and Fluid Retention / Ankle Swelling  Dispense: 90 tablet; Refill: 3  2. Edema  - COMPLETE METABOLIC PANEL WITH GFR  - furosemide (LASIX) 40 MG tablet; Take 1/2 to 1 tablet daily for BP and Fluid Retention / Ankle Swelling  Dispense: 90 tablet; Refill: 3  3. Vitamin D deficiency  - VITAMIN D 25 Hydroxyl  4. Medication management  - CBC with Differential/Platelet - COMPLETE METABOLIC PANEL WITH GFR - Magnesium - TSH - VITAMIN D 25 Hydroxyl     .

## 2017-10-31 NOTE — Patient Instructions (Signed)

## 2017-11-01 LAB — CBC WITH DIFFERENTIAL/PLATELET
BASOS PCT: 1 %
Basophils Absolute: 52 cells/uL (ref 0–200)
EOS ABS: 109 {cells}/uL (ref 15–500)
EOS PCT: 2.1 %
HEMATOCRIT: 37.2 % (ref 35.0–45.0)
HEMOGLOBIN: 12.4 g/dL (ref 11.7–15.5)
LYMPHS ABS: 2044 {cells}/uL (ref 850–3900)
MCH: 30.1 pg (ref 27.0–33.0)
MCHC: 33.3 g/dL (ref 32.0–36.0)
MCV: 90.3 fL (ref 80.0–100.0)
MONOS PCT: 11.5 %
MPV: 8.8 fL (ref 7.5–12.5)
NEUTROS ABS: 2397 {cells}/uL (ref 1500–7800)
Neutrophils Relative %: 46.1 %
Platelets: 301 10*3/uL (ref 140–400)
RBC: 4.12 10*6/uL (ref 3.80–5.10)
RDW: 12.5 % (ref 11.0–15.0)
Total Lymphocyte: 39.3 %
WBC mixed population: 598 cells/uL (ref 200–950)
WBC: 5.2 10*3/uL (ref 3.8–10.8)

## 2017-11-01 LAB — COMPLETE METABOLIC PANEL WITH GFR
AG Ratio: 1.8 (calc) (ref 1.0–2.5)
ALBUMIN MSPROF: 4.7 g/dL (ref 3.6–5.1)
ALT: 31 U/L — ABNORMAL HIGH (ref 6–29)
AST: 26 U/L (ref 10–35)
Alkaline phosphatase (APISO): 66 U/L (ref 33–130)
BILIRUBIN TOTAL: 0.7 mg/dL (ref 0.2–1.2)
BUN: 11 mg/dL (ref 7–25)
CO2: 26 mmol/L (ref 20–32)
CREATININE: 0.8 mg/dL (ref 0.60–0.93)
Calcium: 9.9 mg/dL (ref 8.6–10.4)
Chloride: 97 mmol/L — ABNORMAL LOW (ref 98–110)
GFR, EST AFRICAN AMERICAN: 83 mL/min/{1.73_m2} (ref >=60)
GFR, Est Non African American: 72 mL/min/{1.73_m2} (ref >=60)
GLOBULIN: 2.6 g/dL (ref 1.9–3.7)
Glucose, Bld: 101 mg/dL — ABNORMAL HIGH (ref 65–99)
Potassium: 4.5 mmol/L (ref 3.5–5.3)
SODIUM: 131 mmol/L — AB (ref 135–146)
TOTAL PROTEIN: 7.3 g/dL (ref 6.1–8.1)

## 2017-11-01 LAB — VITAMIN D 25 HYDROXY (VIT D DEFICIENCY, FRACTURES): Vit D, 25-Hydroxy: 63 ng/mL (ref 30–100)

## 2017-11-01 LAB — MAGNESIUM: Magnesium: 2 mg/dL (ref 1.5–2.5)

## 2017-11-01 LAB — TSH: TSH: 1.59 m[IU]/L (ref 0.40–4.50)

## 2017-11-05 ENCOUNTER — Encounter: Payer: Self-pay | Admitting: Internal Medicine

## 2017-11-22 ENCOUNTER — Ambulatory Visit (INDEPENDENT_AMBULATORY_CARE_PROVIDER_SITE_OTHER): Payer: PPO

## 2017-11-22 ENCOUNTER — Other Ambulatory Visit: Payer: Self-pay

## 2017-11-22 ENCOUNTER — Other Ambulatory Visit: Payer: Self-pay | Admitting: Podiatry

## 2017-11-22 ENCOUNTER — Encounter: Payer: Self-pay | Admitting: Podiatry

## 2017-11-22 ENCOUNTER — Ambulatory Visit: Payer: PPO | Admitting: Podiatry

## 2017-11-22 VITALS — BP 148/83 | HR 61 | Resp 16

## 2017-11-22 DIAGNOSIS — M779 Enthesopathy, unspecified: Secondary | ICD-10-CM

## 2017-11-22 DIAGNOSIS — M7752 Other enthesopathy of left foot: Secondary | ICD-10-CM | POA: Diagnosis not present

## 2017-11-22 DIAGNOSIS — M79672 Pain in left foot: Secondary | ICD-10-CM | POA: Diagnosis not present

## 2017-11-22 MED ORDER — TRIAMCINOLONE ACETONIDE 10 MG/ML IJ SUSP: 10.0000 mg | Freq: Once | INTRAMUSCULAR | Status: DC

## 2017-11-22 NOTE — Progress Notes (Signed)
Subjective:   Patient ID: Michaela Kelley, female   DOB: 77 y.o.   MRN: [id number]   HPI Patient presents stating of a lot of pain underneath the left foot and has had at times shooting pains pain when walking and the pain seems sporadic.  Has had previous MRI which was negative for indications of neuroma or other pathology patient does not smoke and likes to be active   Review of Systems  All other systems reviewed and are negative.       Objective:  Physical Exam  Constitutional: She appears well-developed and well-nourished.  Cardiovascular: Intact distal pulses.  Pulmonary/Chest: Effort normal.  Musculoskeletal: Normal range of motion.  Neurological: She is alert.  Skin: Skin is warm.  Nursing note and vitals reviewed.   Neurovascular status intact muscle strength adequate range of motion within normal limits with exquisite discomfort around the second MPJ left and also somewhat positive Biagio Borg sign second interspace left with no pain of the third MPJ.  Patient has good digital perfusion well oriented x3     Assessment:  Possibility for inflammatory capsulitis second MPJ with possibility for neuroma symptoms second interspace left     Plan:  H&P condition reviewed and at this point were to treat his capsulitis I did a proximal nerve block left I then aspirated the second MPJ getting out a small amount of clear fluid and injected with quarter cc Dexasone Kenalog and applied thick padding to reduce pressure.  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

## 2017-11-22 NOTE — Progress Notes (Signed)
   Subjective:    Patient ID: Michaela Kelley, female    DOB: Oct 31, 1940, 77 y.o.   MRN: [id number]  HPI    Review of Systems  All other systems reviewed and are negative.      Objective:   Physical Exam        Assessment & Plan:

## 2017-12-06 ENCOUNTER — Encounter: Payer: Self-pay | Admitting: Podiatry

## 2017-12-06 ENCOUNTER — Ambulatory Visit: Payer: PPO | Admitting: Podiatry

## 2017-12-06 DIAGNOSIS — M21619 Bunion of unspecified foot: Secondary | ICD-10-CM

## 2017-12-06 DIAGNOSIS — M779 Enthesopathy, unspecified: Secondary | ICD-10-CM | POA: Diagnosis not present

## 2017-12-06 DIAGNOSIS — M2041 Other hammer toe(s) (acquired), right foot: Secondary | ICD-10-CM

## 2017-12-06 DIAGNOSIS — M2042 Other hammer toe(s) (acquired), left foot: Secondary | ICD-10-CM | POA: Diagnosis not present

## 2017-12-06 NOTE — Progress Notes (Signed)
Subjective:   Patient ID: Michaela Kelley, female   DOB: 77 y.o.   MRN: [id number]   HPI Patient states improved but still getting some discomfort and patient is very active and likes to play golf   ROS      Objective:  Physical Exam  Neurovascular status that with patient found to have structural abnormality of the foot with prominent bunion formation and prominence of the second metatarsal and moderate third metatarsal left foot with inflammation mostly around the second MPJ     Assessment:  Inflammatory capsulitis second MPJ left with structural bunion hammertoe deformity     Plan:  Reviewed condition and recommended orthotics to reduce the pressure and I recommended they be made in a soft variety and that they fit a golf shoe well and patient will see the ped orthotist to have these casted at the current time

## 2017-12-12 ENCOUNTER — Ambulatory Visit (INDEPENDENT_AMBULATORY_CARE_PROVIDER_SITE_OTHER): Payer: PPO | Admitting: Orthotics

## 2017-12-12 DIAGNOSIS — M2042 Other hammer toe(s) (acquired), left foot: Secondary | ICD-10-CM

## 2017-12-12 DIAGNOSIS — M2041 Other hammer toe(s) (acquired), right foot: Secondary | ICD-10-CM

## 2017-12-12 DIAGNOSIS — M779 Enthesopathy, unspecified: Secondary | ICD-10-CM

## 2017-12-12 NOTE — Progress Notes (Signed)
Patient seen today for functional CMFO to address 2nd capsulitis left.  Patient is 77 year old active lady who enjoys frequent golf.  Goal is to offload 2nd mpj through poron offload as well as small met pad b/l.  Plan on soft cushioned sport Richie F/O.  Patient advised of cost, but HTA gave authorization.

## 2017-12-26 ENCOUNTER — Encounter: Payer: Self-pay | Admitting: Physician Assistant

## 2018-01-02 ENCOUNTER — Ambulatory Visit (INDEPENDENT_AMBULATORY_CARE_PROVIDER_SITE_OTHER): Payer: PPO | Admitting: Orthotics

## 2018-01-02 DIAGNOSIS — M21619 Bunion of unspecified foot: Secondary | ICD-10-CM

## 2018-01-02 DIAGNOSIS — M2042 Other hammer toe(s) (acquired), left foot: Secondary | ICD-10-CM

## 2018-01-02 DIAGNOSIS — M2041 Other hammer toe(s) (acquired), right foot: Secondary | ICD-10-CM

## 2018-01-02 DIAGNOSIS — M79672 Pain in left foot: Secondary | ICD-10-CM

## 2018-01-02 DIAGNOSIS — M779 Enthesopathy, unspecified: Secondary | ICD-10-CM

## 2018-01-02 NOTE — Progress Notes (Signed)
Patient came in today to pick up custom made foot orthotics.  The goals were accomplished and the patient reported no dissatisfaction with said orthotics.  Patient was advised of breakin period and how to report any issues. 

## 2018-01-31 NOTE — Progress Notes (Signed)
CPE  Assessment:   Essential hypertension - continue medications, DASH diet, exercise and monitor at home. Call if greater than 130/80.  -     CBC with Differential/Platelet -     COMPLETE METABOLIC PANEL WITH GFR -     TSH -     Urinalysis, Routine w reflex microscopic -     Microalbumin / creatinine urine ratio -     EKG 12-Lead  Tortuous aorta (HCC) Control blood pressure, cholesterol, glucose, increase exercise.   Hyperlipidemia, unspecified hyperlipidemia type -     Lipid panel check lipids decrease fatty foods increase activity.   Medication management -     Magnesium  Vitamin D deficiency -     VITAMIN D 25 Hydroxy (Vit-D Deficiency, Fractures)  Seasonal allergic rhinitis, unspecified trigger Continue meds  Encounter for general adult medical examination with abnormal findings 1 year  Osteoporosis without current pathological fracture, unspecified osteoporosis type -     alendronate (FOSAMAX) 70 MG tablet; Take 1 tablet (70 mg total) by mouth once a week. Repeat DEXA 2 years.   Needs flu shot -     Flu vaccine HIGH DOSE PF   Future Appointments  Date Time Provider [place]  06/27/2018 10:45 AM Vicie Mutters, PA-C [facility] None  02/12/2019  9:00 AM Vicie Mutters, PA-C [facility] None      Subjective:  Michaela Kelley is a 77 y.o. female who presents for CPE and follow up for cholesterol, allergies, and vitamin D    She has broken blood vessels bilateral legs, worse left leg, she is on 1/2 lasix for last 6 months, no swelling  Her blood pressure has been controlled at home, today their BP is BP: 124/80. She does workout, walking, stretching and golfing.  She denies chest pain, shortness of breath, dizziness. She has orthotic for left foot pain, seeing Dr. Paulla Dolly.  Allergies are doing well at this time, mainly takes Singulair in the spring.  She is on cholesterol medication, she is off lipitor due to cramping. Her cholesterol is not at goal. The  cholesterol last visit was:   Lab Results  Component Value Date   CHOL 226 (H) 01/10/2017   HDL 108 01/10/2017   LDLCALC 99 01/10/2017   TRIG 98 01/10/2017   CHOLHDL 2.1 01/10/2017   She has been working on diet and exercise for prediabetes, and denies paresthesia of the feet, polydipsia, polyuria and visual disturbances. Last A1C in the office was:  Lab Results  Component Value Date   HGBA1C 5.2 01/10/2017  Patient is on Vitamin D supplement.   Lab Results  Component Value Date   VD25OH 63 10/31/2017   BMI is Body mass index is 25.99 kg/m., she is working on diet and exercise. Wt Readings from Last 3 Encounters:  02/01/18 144 lb 6.4 oz (65.5 kg)  10/31/17 146 lb 12.8 oz (66.6 kg)  06/12/17 146 lb 12.8 oz (66.6 kg)    Names of Other Physician/Practitioners you currently use: 1. [facility] here for primary care 2. Dr. Celene Squibb, eye doctor, May 2018 has OV in June 3. Dr. Edsel Petrin, dentist, Oct 2018 4. Dr. Sharlett Iles, GI 5. [place] 6. Dr. Marguerite Olea, derm Patient Care Team: Unk Pinto, MD as PCP - General (Internal Medicine)  Medication Review: Current Outpatient Medications on File Prior to Visit  Medication Sig Dispense Refill  . albuterol (PROAIR HFA) 108 (90 Base) MCG/ACT inhaler Inhale 2 puffs into the lungs every 6 (six) hours as  needed for wheezing or shortness of breath. 1 Inhaler 2  . aspirin EC 81 MG tablet Take 81 mg by mouth daily.      Marland Kitchen atenolol (TENORMIN) 50 MG tablet Take 1 tablet daily for BP 90 tablet 3  . b complex vitamins capsule Take 1 capsule by mouth daily.    . Cholecalciferol (VITAMIN D PO) Take 4,000 Int'l Units by mouth daily.     . furosemide (LASIX) 40 MG tablet Take 1/2 to 1 tablet daily for BP and Fluid Retention / Ankle Swelling 90 tablet 3  . hydroCHLOROthiazide (MICROZIDE PO) hydrochlorothiazide    . MAGNESIUM PO Take 150 mg by mouth daily.     . montelukast (SINGULAIR) 10 MG tablet  TAKE ONE TABLET EACH DAY 30 tablet 2  . Omega-3 Fatty Acids (FISH OIL) 1000 MG CAPS Take by mouth daily.    . TURMERIC PO Take 800 mg by mouth 2 (two) times daily.      Current Facility-Administered Medications on File Prior to Visit  Medication Dose Route Frequency Provider Last Rate Last Dose  . triamcinolone acetonide (KENALOG) 10 MG/ML injection 10 mg  10 mg Other Once Wallene Huh, DPM        Current Problems (verified) Patient Active Problem List   Diagnosis Date Noted  . Osteoporosis 02/01/2018  . Tortuous aorta ([place]) 01/24/2017  . Medication management 10/03/2013  . Seasonal allergic rhinitis   . Vitamin D deficiency   . Hyperlipidemia 01/02/2008  . Essential hypertension 01/02/2008    Screening Tests Immunization History  Administered Date(s) Administered  . Influenza, High Dose Seasonal PF 03/10/2015, 01/10/2017, 02/01/2018  . Pneumococcal Conjugate-13 05/05/2014  . Pneumococcal Polysaccharide-23 06/28/2006  . Td 05/27/2011   Preventative care: Tetanus: 2013 Pneumovax: 2008 Prevnar 13: 2016 Flu vaccine: 2018 Zostavax: declines  Pap: 2011 normal- declines another MGM:06/2017 DEXA: Osteoporosis 06/2017 Young Adult T-Score:-2.8 forearm, hip -2.0 Colonoscopy: 2018 (Dr. Sharlett Iles)  CT chest 2010 CXR 2017  Allergies Allergies  Allergen Reactions  . Codeine Nausea And Vomiting and Other (See Comments)    Severe headaches (tolerates Dilaudid)  . Hydrocodone-Acetaminophen Nausea And Vomiting and Other (See Comments)    Severe headaches (tolerates Dilaudid)  . Oxycodone-Acetaminophen Nausea And Vomiting and Other (See Comments)    Severe headaches (tolerates Dilaudid)  . Oxycodone-Aspirin Nausea And Vomiting and Other (See Comments)    Severe headaches (tolerates Dilaudid)    SURGICAL HISTORY She  has a past surgical history that includes Knee Arthroplasty (2010); Dilation and curettage of uterus; Tonsillectomy; Back surgery (1999); Total shoulder  arthroplasty (02/10/2011); and Total shoulder arthroplasty (12/22/2011). FAMILY HISTORY Her family history includes Breast cancer in her mother; Cancer in her mother; Heart disease in her brother; Hypertension in her father. SOCIAL HISTORY She  reports that she quit smoking about 26 years ago. Her smoking use included cigarettes. She has a 5.00 pack-year smoking history. She has never used smokeless tobacco. She reports that she drinks about 8.0 standard drinks of alcohol per week. She reports that she does not use drugs.   Objective:     Blood pressure 124/80, temperature (!) 97.5 F (36.4 C), resp. rate 14, height 5' 2.5" (1.588 m), weight 144 lb 6.4 oz (65.5 kg), SpO2 94 %. Body mass index is 25.99 kg/m.  General appearance: alert, no distress, WD/WN, female HEENT: normocephalic, sclerae anicteric, TMs pearly, nares patent, no discharge or erythema, pharynx normal Oral cavity: MMM, no lesions Neck: supple, no lymphadenopathy, no thyromegaly, no masses Heart: RRR,  normal S1, S2, no murmurs Lungs: CTA bilaterally, no wheezes, rhonchi, or rales Abdomen: +bs, soft, non tender, non distended, no masses, no hepatomegaly, no splenomegaly Musculoskeletal: nontender, no swelling, no obvious deformity Extremities: no edema, no cyanosis, no clubbing Pulses: 2+ symmetric, upper and lower extremities, normal cap refill Neurological: alert, oriented x 3, CN2-12 intact, strength normal upper extremities and lower extremities, sensation normal throughout, DTRs 2+ throughout, no cerebellar signs, gait normal Psychiatric: normal affect, behavior normal, pleasant   EKG:  Unchanged T wave inversion V3-V 6 and ii, III and aVf Has had cardio work up in the past, no symptoms, continue to monitor  Vicie Mutters, PA-C   02/01/2018

## 2018-02-01 ENCOUNTER — Encounter: Payer: Self-pay | Admitting: Physician Assistant

## 2018-02-01 ENCOUNTER — Ambulatory Visit (INDEPENDENT_AMBULATORY_CARE_PROVIDER_SITE_OTHER): Payer: PPO | Admitting: Physician Assistant

## 2018-02-01 VITALS — BP 124/80 | Temp 97.5°F | Resp 14 | Ht 62.5 in | Wt 144.4 lb

## 2018-02-01 DIAGNOSIS — Z136 Encounter for screening for cardiovascular disorders: Secondary | ICD-10-CM | POA: Diagnosis not present

## 2018-02-01 DIAGNOSIS — R9389 Abnormal findings on diagnostic imaging of other specified body structures: Secondary | ICD-10-CM

## 2018-02-01 DIAGNOSIS — Z0000 Encounter for general adult medical examination without abnormal findings: Secondary | ICD-10-CM | POA: Diagnosis not present

## 2018-02-01 DIAGNOSIS — Z23 Encounter for immunization: Secondary | ICD-10-CM

## 2018-02-01 DIAGNOSIS — E785 Hyperlipidemia, unspecified: Secondary | ICD-10-CM

## 2018-02-01 DIAGNOSIS — E559 Vitamin D deficiency, unspecified: Secondary | ICD-10-CM | POA: Diagnosis not present

## 2018-02-01 DIAGNOSIS — I10 Essential (primary) hypertension: Secondary | ICD-10-CM | POA: Diagnosis not present

## 2018-02-01 DIAGNOSIS — I771 Stricture of artery: Secondary | ICD-10-CM

## 2018-02-01 DIAGNOSIS — Z0001 Encounter for general adult medical examination with abnormal findings: Secondary | ICD-10-CM

## 2018-02-01 DIAGNOSIS — J302 Other seasonal allergic rhinitis: Secondary | ICD-10-CM

## 2018-02-01 DIAGNOSIS — Z79899 Other long term (current) drug therapy: Secondary | ICD-10-CM

## 2018-02-01 DIAGNOSIS — M810 Age-related osteoporosis without current pathological fracture: Secondary | ICD-10-CM

## 2018-02-01 MED ORDER — ALENDRONATE SODIUM 70 MG PO TABS: 70.0000 mg | ORAL_TABLET | ORAL | 3 refills | Status: DC

## 2018-02-01 NOTE — Patient Instructions (Addendum)
Breaking a bone can be serious, especially if the bone is in the hip. People who break a hip sometimes lose the ability to walk on their own. Many of them end up in a nursing home. That's why it is so important to avoid breaking a bone in the first place.   What can I do to keep my bones as healthy as possible? - You can: ?Eat foods with a lot of calcium, such as milk, yogurt, and green leafy vegetables  ?Eat foods with a lot of vitamin D, such as milk that has vitamin D added, and fish from the ocean  ?Take calcium and vitamin D pills (if you do not get enough from the food that you eat) ?Be active for at least 30 minutes, most days of the week ?Avoid smoking  ?Limit the amount of alcohol you drink to 1 to 2 drinks a day at most  What do osteoporosis medicines do? - If you have osteoporosis or a high risk of breaking a bone, the medicines your doctor prescribes can: ?Reduce bone loss  ?Increase bone density or keep it about the same ?Reduce the chances that you will break a bone   Bisphosphonates - Most people being treated for osteoporosis take these medicines first. If they do not work well enough or cause side effects that are too hard to handle, there are other options.   Most people take one pill every week. If your doctor prescribes a bisphosphonate pill, you must take the medicine exactly as directed. If you don't, the medicine can irritate your throat or stomach. For most bisphosphonate pills, you must:  ?Take the pill first thing in the morning, before you have anything to eat or drink.  ?Drink an 8-ounce glass of water with the pill, but not eat or drink anything else for 30 minutes or 1 hour (depending on which pill you take).  ?Avoid lying down for 30 minutes after taking the pill. (You must sit or stand during that time).       VENOUS INSUFFICIENCY Our lower leg venous system is not the most reliable, the heart does NOT pump fluid up, there is a valve system.  The muscles  of the leg squeeze and the blood moves up and a valve opens and close, then they squeeze, blood moves up and valves open and closes keeping the blood moving towards the heart.  Lots can go wrong with this valve system.  If someone is sitting or standing without movement, everyone will get swelling.  15-20 pressure VARICOSE VEINS Varicose veins are veins that have become enlarged and twisted. CAUSES This condition is the result of valves in the veins not working properly. Valves in the veins help return blood from the leg to the heart. When your calf muscles squeeze, the blood moves up your leg then the valves close and this continues until the blood gets back to your heart.  If these valves are damaged, blood flows backwards and backs up into the veins in the leg near the skin OR if your are sitting/standing for a long time without using your calf muscles the blood will back up into the veins in your legs. This causes the veins to become larger. People who are on their feet a lot, sit a lot without walking (like on a plane, at a desk, or in a car), who are pregnant, or who are overweight are more likely to develop varicose veins. SYMPTOMS   Bulging, twisted-appearing, bluish veins, most  commonly found on the legs.  Leg pain or a feeling of heaviness. These symptoms may be worse at the end of the day.  Leg swelling.  Skin color changes. DIAGNOSIS  Varicose veins can usually be diagnosed with an exam of your legs by your caregiver. He or she may recommend an ultrasound of your leg veins. TREATMENT  Most varicose veins can be treated at home. However, other treatments are available for people who have persistent symptoms or who want to treat the cosmetic appearance of the varicose veins. But this is only cosmetic and they will return if not properly treated. These include:  Laser treatment of very small varicose veins.  Medicine that is shot (injected) into the vein. This medicine hardens the  walls of the vein and closes off the vein. This treatment is called sclerotherapy. Afterwards, you may need to wear clothing or bandages that apply pressure.  Surgery. HOME CARE INSTRUCTIONS   Do not stand or sit in one position for long periods of time. Do not sit with your legs crossed. Rest with your legs raised during the day.  Your legs have to be higher than your heart so that gravity will force the valves to open, so please really elevate your legs.   Wear elastic stockings or support hose. Do not wear other tight, encircling garments around the legs, pelvis, or waist.  [employer]  has a wide variety of well priced compression stockings. [place], [place] [address] [phone]  OR THERE ARE COPPER INFUSED COMPRESSION SOCKS AT [facility] OR [facility]  [employer] also has great cheap/afforable stockings or socks- the socks are easier to get on your feet  - can also get a jacob's donner that helps you put on the sock  Walk as much as possible to increase blood flow.  Raise the foot of your bed at night with 2-inch blocks.  If you get a cut in the skin over the vein and the vein bleeds, lie down with your leg raised and press on it with a clean cloth until the bleeding stops. Then place a bandage (dressing) on the cut. See your caregiver if it continues to bleed or needs stitches. SEEK MEDICAL CARE IF:   The skin around your ankle starts to break down.  You have pain, redness, tenderness, or hard swelling developing in your leg over a vein.  You are uncomfortable due to leg pain. Document Released: 12/22/2004 Document Revised: 06/06/2011 Document Reviewed: 05/10/2010 [facility] Patient Information 2014 [place].  Silent reflux: Not all heartburn burns...Marland KitchenMarland KitchenMarland Kitchen  What is LPR? Laryngopharyngeal reflux (LPR) or silent reflux is a condition in which acid that is made in the stomach travels up the esophagus (swallowing tube) and gets to the throat. Not everyone with  reflux has a lot of heartburn or indigestion. In fact, many people with LPR never have heartburn. This is why LPR is called SILENT REFLUX, and the terms "Silent reflux" and "LPR" are often used interchangeably. Because LPR is silent, it is sometimes difficult to diagnose.  How can you tell if you have LPR?  Marland Kitchen Chronic hoarseness- Some people have hoarseness that comes and goes . throat clearing  . Cough . It can cause shortness of breath and cause asthma like symptoms. Marland Kitchen a feeling of a lump in the throat  . difficulty swallowing . a problem with too much nose and throat drainage.  . Some people will feel their esophagus spasm which feels like their heart beating  hard and fast, this will usually be after a meal, at rest, or lying down at night.    How do I treat this? Treatment for LPR should be individualized, and your doctor will suggest the best treatment for you. Generally there are several treatments for LPR: . changing habits and diet to reduce reflux,  . medications to reduce stomach acid, and  . surgery to prevent reflux. Most people with LPR need to modify how and when they eat, as well as take some medication, to get well. Sometimes, nonprescription liquid antacids, such as Maalox, Gelucil and Mylanta are recommended. When used, these antacids should be taken four times each day - one tablespoon one hour after each meal and before bedtime. Dietary and lifestyle changes alone are not often enough to control LPR - medications that reduce stomach acid are also usually needed. These must be prescribed by our doctor.   TIPS FOR REDUCING REFLUX AND LPR Control your LIFE-STYLE and your DIET! Marland Kitchen If you use tobacco, QUIT.  Marland Kitchen Smoking makes you reflux. After every cigarette you have some LPR.  . Don't wear clothing that is too tight, especially around the waist (trousers, corsets, belts).  . Do not lie down just after eating...in fact, do not eat within three hours of bedtime.  . You should  be on a low-fat diet.  . Limit your intake of red meat.  . Limit your intake of butter.  Marland Kitchen Avoid fried foods.  . Avoid chocolate  . Avoid cheese.  Marland Kitchen Avoid eggs. Marland Kitchen Specifically avoid caffeine (especially coffee and tea), soda pop (especially cola) and mints.  . Avoid alcoholic beverages, particularly in the evening.  WOMEN AND HEART ATTACKS  Being a woman you may not have the typical symptoms of a heart attack.  You may not have any pain OR you may have atypical pain such as jaw pain, upper back pain, arm pain, "my bra feels to tight" and you will often have symptoms with it like below.  Symptoms for a heart attack will likely occur when you exert your self or exercise and include: Shortness of breath Sweating Nausea Dizziness Fast or irregular heart beats Fatigue   It makes me feel better if my patients get their heart rate up with exercise once or twice a week and pay close attention to your body. If there is ANY change in your exercise capacity or if you have symptoms above, please STOP and call 911 or call to come to the office.   Here is some information to help you keep your heart healthy: Move it! - Aim for 30 mins of activity every day. Take it slowly at first. Talk to [place] before starting any new exercise program.   Lose it.  -Body Mass Index (BMI) can indicate if you need to lose weight. A healthy range is 18.5-24.9. For a BMI calculator, go to [employer]  Waist Management -Excess abdominal fat is a risk factor for heart disease, diabetes, asthma, stroke and more. Ideal waist circumference is less than 35" for women and less than 40" for men.   Eat Right -focus on fruits, vegetables, whole grains, and meals you make yourself. Avoid foods with trans fat and high sugar/sodium content.   Snooze or Snore? - Loud snoring can be a sign of sleep apnea, a significant risk factor for high blood pressure, heart attach, stroke, and heart arrhythmias.  Kick the habit -Quit  Smoking! Avoid second hand smoke. A single cigarette raises your blood pressure  for 20 mins and increases the risk of heart attack and stroke for the next 24 hours.   Are Aspirin and Supplements right for you? -Add ENTERIC COATED low dose 81 mg Aspirin daily OR can do every other day if you have easy bruising to protect your heart and head. As well as to reduce risk of Colon Cancer by 20 %, Skin Cancer by 26 % , Melanoma by 46% and Pancreatic cancer by 60%  Say "No to Stress -There may be little you can do about problems that cause stress. However, techniques such as long walks, meditation, and exercise can help you manage it.   Start Now! - Make changes one at a time and set reasonable goals to increase your likelihood of success.

## 2018-02-02 LAB — MICROALBUMIN / CREATININE URINE RATIO: Creatinine, Urine: 22 mg/dL (ref 20–275)

## 2018-02-02 LAB — URINALYSIS, ROUTINE W REFLEX MICROSCOPIC
Bilirubin Urine: NEGATIVE
Glucose, UA: NEGATIVE
Hgb urine dipstick: NEGATIVE
Ketones, ur: NEGATIVE
Leukocytes, UA: NEGATIVE
NITRITE: NEGATIVE
Protein, ur: NEGATIVE
SPECIFIC GRAVITY, URINE: 1.009 (ref 1.001–1.03)
pH: 7.5 (ref 5.0–8.0)

## 2018-02-02 LAB — COMPLETE METABOLIC PANEL WITH GFR
AG Ratio: 1.6 (calc) (ref 1.0–2.5)
ALT: 30 U/L — AB (ref 6–29)
AST: 26 U/L (ref 10–35)
Albumin: 4.4 g/dL (ref 3.6–5.1)
Alkaline phosphatase (APISO): 61 U/L (ref 33–130)
BUN: 10 mg/dL (ref 7–25)
CO2: 28 mmol/L (ref 20–32)
Calcium: 10 mg/dL (ref 8.6–10.4)
Chloride: 99 mmol/L (ref 98–110)
Creat: 0.91 mg/dL (ref 0.60–0.93)
GFR, EST NON AFRICAN AMERICAN: 61 mL/min/{1.73_m2} (ref >=60)
GFR, Est African American: 71 mL/min/{1.73_m2} (ref >=60)
GLOBULIN: 2.7 g/dL (ref 1.9–3.7)
Glucose, Bld: 96 mg/dL (ref 65–99)
Potassium: 4.2 mmol/L (ref 3.5–5.3)
SODIUM: 135 mmol/L (ref 135–146)
TOTAL PROTEIN: 7.1 g/dL (ref 6.1–8.1)
Total Bilirubin: 0.8 mg/dL (ref 0.2–1.2)

## 2018-02-02 LAB — CBC WITH DIFFERENTIAL/PLATELET
Basophils Absolute: 40 cells/uL (ref 0–200)
Basophils Relative: 0.6 %
EOS ABS: 101 {cells}/uL (ref 15–500)
Eosinophils Relative: 1.5 %
HCT: 37.9 % (ref 35.0–45.0)
Hemoglobin: 12.8 g/dL (ref 11.7–15.5)
Lymphs Abs: 2265 cells/uL (ref 850–3900)
MCH: 30.5 pg (ref 27.0–33.0)
MCHC: 33.8 g/dL (ref 32.0–36.0)
MCV: 90.5 fL (ref 80.0–100.0)
MPV: 9.1 fL (ref 7.5–12.5)
Monocytes Relative: 10.5 %
NEUTROS PCT: 53.6 %
Neutro Abs: 3591 cells/uL (ref 1500–7800)
PLATELETS: 326 10*3/uL (ref 140–400)
RBC: 4.19 10*6/uL (ref 3.80–5.10)
RDW: 12.2 % (ref 11.0–15.0)
TOTAL LYMPHOCYTE: 33.8 %
WBC: 6.7 10*3/uL (ref 3.8–10.8)
WBCMIX: 704 {cells}/uL (ref 200–950)

## 2018-02-02 LAB — LIPID PANEL
CHOL/HDL RATIO: 3 (calc) (ref <5.0)
Cholesterol: 239 mg/dL — ABNORMAL HIGH (ref <200)
HDL: 81 mg/dL (ref >50)
LDL Cholesterol (Calc): 130 mg/dL (calc) — ABNORMAL HIGH
NON-HDL CHOLESTEROL (CALC): 158 mg/dL — AB (ref <130)
TRIGLYCERIDES: 168 mg/dL — AB (ref <150)

## 2018-02-02 LAB — TSH: TSH: 1.78 mIU/L (ref 0.40–4.50)

## 2018-02-02 LAB — MAGNESIUM: Magnesium: 1.8 mg/dL (ref 1.5–2.5)

## 2018-02-02 LAB — VITAMIN D 25 HYDROXY (VIT D DEFICIENCY, FRACTURES): Vit D, 25-Hydroxy: 55 ng/mL (ref 30–100)

## 2018-04-10 DIAGNOSIS — M1711 Unilateral primary osteoarthritis, right knee: Secondary | ICD-10-CM | POA: Diagnosis not present

## 2018-04-17 DIAGNOSIS — M1711 Unilateral primary osteoarthritis, right knee: Secondary | ICD-10-CM | POA: Diagnosis not present

## 2018-04-24 DIAGNOSIS — M1711 Unilateral primary osteoarthritis, right knee: Secondary | ICD-10-CM | POA: Diagnosis not present

## 2018-04-24 DIAGNOSIS — M25561 Pain in right knee: Secondary | ICD-10-CM | POA: Diagnosis not present

## 2018-06-05 DIAGNOSIS — I788 Other diseases of capillaries: Secondary | ICD-10-CM | POA: Diagnosis not present

## 2018-06-05 DIAGNOSIS — L718 Other rosacea: Secondary | ICD-10-CM | POA: Diagnosis not present

## 2018-06-06 DIAGNOSIS — H35372 Puckering of macula, left eye: Secondary | ICD-10-CM | POA: Diagnosis not present

## 2018-06-19 ENCOUNTER — Encounter: Payer: Self-pay | Admitting: Physician Assistant

## 2018-06-27 ENCOUNTER — Ambulatory Visit: Payer: Self-pay | Admitting: Physician Assistant

## 2018-07-18 ENCOUNTER — Other Ambulatory Visit: Payer: Self-pay | Admitting: Physician Assistant

## 2018-07-18 DIAGNOSIS — J302 Other seasonal allergic rhinitis: Secondary | ICD-10-CM

## 2018-07-26 NOTE — Progress Notes (Addendum)
Medicare wellness  THIS ENCOUNTER IS A VIRTUAL/TELEVIDEO VISIT DUE TO COVID-19 - PATIENT WAS NOT SEEN IN THE OFFICE.  PATIENT HAS CONSENTED TO VIRTUAL VISIT / TELEVIDEO VISIT  This provider placed a call to Michaela Kelley, her appointment was changed to a virtual office visit to reduce the risk of exposure to the COVID-19 virus and to help Michaela Kelley remain healthy and safe. The virtual visit will also provide continuity of care. She verbalizes understanding.    Assessment:   Essential hypertension - continue medications, DASH diet, exercise and monitor at home. Call if greater than 130/80.   Tortuous aorta (HCC) Control blood pressure, cholesterol, glucose, increase exercise.   Hyperlipidemia, unspecified hyperlipidemia type check lipids decrease fatty foods increase activity.   Vitamin D deficiency -    Continue supplement  Seasonal allergic rhinitis, unspecified trigger Continue meds  Medicare wellness 1 year  Osteoporosis without current pathological fracture, unspecified osteoporosis type Repeat DEXA 2 years.  Has not started fosamax, will start and try to not take with tumeric   Future Appointments  Date Time Provider [place]  02/12/2019  9:00 AM Vicie Mutters, PA-C [facility] None      Subjective:  Michaela Kelley is a 78 y.o. female who presents for medicare wellness and follow up for cholesterol, allergies, and vitamin D    BMI is Body mass index is 25.97 kg/m., she is working on diet and exercise. Wt Readings from Last 3 Encounters:  07/27/18 142 lb (64.4 kg)  02/01/18 144 lb 6.4 oz (65.5 kg)  10/31/17 146 lb 12.8 oz (66.6 kg)     Her blood pressure has been controlled at home, today their BP is BP: 123/68. She does workout, walking, stretching and golfing.  She denies chest pain, shortness of breath, dizziness.  Allergies are doing well at this time, mainly takes Singulair in the spring.  She is on cholesterol medication, she is off lipitor due  to cramping. Her cholesterol is not at goal. The cholesterol last visit was:   Lab Results  Component Value Date   CHOL 239 (H) 02/01/2018   HDL 81 02/01/2018   LDLCALC 130 (H) 02/01/2018   TRIG 168 (H) 02/01/2018   CHOLHDL 3.0 02/01/2018   She has been working on diet and exercise for prediabetes, and denies paresthesia of the feet, polydipsia, polyuria and visual disturbances. Last A1C in the office was:  Lab Results  Component Value Date   HGBA1C 5.2 01/10/2017  Patient is on Vitamin D supplement.   Lab Results  Component Value Date   VD25OH 55 02/01/2018   Names of Other Physician/Practitioners you currently use: 1. [facility] here for primary care 2. Dr. Celene Squibb, eye doctor, Feb 2020 3. Dr. Edsel Petrin, dentist, Jan 2020 4. Dr. Sharlett Iles, GI 5. [place] 6. Dr. Marguerite Olea, derm Patient Care Team: Unk Pinto, MD as PCP - General (Internal Medicine)  Medication Review: Current Outpatient Medications on File Prior to Visit  Medication Sig Dispense Refill  . alendronate (FOSAMAX) 70 MG tablet Take 1 tablet (70 mg total) by mouth once a week. 4 tablet 3  . aspirin EC 81 MG tablet Take 81 mg by mouth daily.      Marland Kitchen atenolol (TENORMIN) 50 MG tablet Take 1 tablet daily for BP 90 tablet 3  . b complex vitamins capsule Take 1 capsule by mouth daily.    . Cholecalciferol (VITAMIN D PO) Take 4,000 Int'l Units by mouth daily.     Marland Kitchen  furosemide (LASIX) 40 MG tablet Take 1/2 to 1 tablet daily for BP and Fluid Retention / Ankle Swelling 90 tablet 3  . hydroCHLOROthiazide (MICROZIDE PO) hydrochlorothiazide    . MAGNESIUM PO Take 150 mg by mouth daily.     . montelukast (SINGULAIR) 10 MG tablet TAKE ONE TABLET EACH DAY 30 tablet 2  . Omega-3 Fatty Acids (FISH OIL) 1000 MG CAPS Take by mouth daily.    Marland Kitchen PROAIR HFA 108 (90 Base) MCG/ACT inhaler INHALE 2 PUFFS INTO THE LUNGS EVERY 6 HOURS AS NEEDED FOR WHEEZING ORSHORTNESS OF BREATH 8.5 g 0   . TURMERIC PO Take 800 mg by mouth 2 (two) times daily.      Current Facility-Administered Medications on File Prior to Visit  Medication Dose Route Frequency Provider Last Rate Last Dose  . triamcinolone acetonide (KENALOG) 10 MG/ML injection 10 mg  10 mg Other Once Wallene Huh, DPM        Current Problems (verified) Patient Active Problem List   Diagnosis Date Noted  . Osteoporosis 02/01/2018  . Tortuous aorta ([place]) 01/24/2017  . Medication management 10/03/2013  . Seasonal allergic rhinitis   . Vitamin D deficiency   . Hyperlipidemia 01/02/2008  . Essential hypertension 01/02/2008    Screening Tests Immunization History  Administered Date(s) Administered  . Influenza, High Dose Seasonal PF 03/10/2015, 01/10/2017, 02/01/2018  . Pneumococcal Conjugate-13 05/05/2014  . Pneumococcal Polysaccharide-23 06/28/2006  . Td 05/27/2011   Preventative care: Tetanus: 2013 Pneumovax: 2008 Prevnar 13: 2016 Flu vaccine: 2019 Zostavax: declines  Pap: 2011 normal- declines another MGM:06/2017 will get after the pandemic DEXA: Osteoporosis 06/2017 Young Adult T-Score:-2.8 forearm, hip -2.0 Colonoscopy: 2018 (Dr. Sharlett Iles)  CT chest 2010 CXR 2017  Allergies Allergies  Allergen Reactions  . Codeine Nausea And Vomiting and Other (See Comments)    Severe headaches (tolerates Dilaudid)  . Hydrocodone-Acetaminophen Nausea And Vomiting and Other (See Comments)    Severe headaches (tolerates Dilaudid)  . Oxycodone-Acetaminophen Nausea And Vomiting and Other (See Comments)    Severe headaches (tolerates Dilaudid)  . Oxycodone-Aspirin Nausea And Vomiting and Other (See Comments)    Severe headaches (tolerates Dilaudid)    SURGICAL HISTORY She  has a past surgical history that includes Knee Arthroplasty (2010); Dilation and curettage of uterus; Tonsillectomy; Back surgery (1999); Total shoulder arthroplasty (02/10/2011); and Total shoulder arthroplasty (12/22/2011). FAMILY  HISTORY Her family history includes Breast cancer in her mother; Cancer in her mother; Heart disease in her brother; Hypertension in her father. SOCIAL HISTORY She  reports that she quit smoking about 26 years ago. Her smoking use included cigarettes. She has a 5.00 pack-year smoking history. She has never used smokeless tobacco. She reports current alcohol use of about 8.0 standard drinks of alcohol per week. She reports that she does not use drugs.  MEDICARE WELLNESS OBJECTIVES: Physical activity: Current Exercise Habits: Home exercise routine, Type of exercise: walking(golf), Time (Minutes): 30, Frequency (Times/Week): 5, Weekly Exercise (Minutes/Week): 150, Intensity: Moderate Cardiac risk factors: Cardiac Risk Factors include: advanced age (>24men, >20 women);hypertension;dyslipidemia Depression/mood screen:   Depression screen [facility] 2/9 07/27/2018  Decreased Interest 0  Down, Depressed, Hopeless 0  PHQ - 2 Score 0    ADLs:  In your present state of health, do you have any difficulty performing the following activities: 07/27/2018 11/05/2017  Hearing? N N  Vision? N N  Difficulty concentrating or making decisions? N N  Walking or climbing stairs? N N  Dressing or bathing? N N  Doing errands,  shopping? N N  Some recent data might be hidden     Cognitive Testing  Alert? Yes  Normal Appearance?Yes  Oriented to person? Yes  Place? Yes   Time? Yes  Recall of three objects?  Yes  Can perform simple calculations? Yes  Displays appropriate judgment?Yes  Can read the correct time from a watch face?Yes  EOL planning: Does Patient Have a Medical Advance Directive?: Yes Type of Advance Directive: [place] will Does patient want to make changes to medical advance directive?: No - Patient declined     Objective:     Blood pressure 123/68, pulse 66, temperature 98.5 F (36.9 C), height 5\' 2"  (1.575 m), weight 142 lb (64.4 kg). Body mass index is 25.97  kg/m.  General Appearance:Well sounding, in no apparent distress.  ENT/Mouth: No hoarseness, No cough for duration of visit.  Respiratory: completing full sentences without distress, without audible wheeze Neuro: Awake and oriented X 3,  Psych:  Insight and Judgment appropriate.     Medicare Attestation I have personally reviewed: The patient's medical and social history Their use of alcohol, tobacco or illicit drugs Their current medications and supplements The patient's functional ability including ADLs,fall risks, home safety risks, cognitive, and hearing and visual impairment Diet and physical activities Evidence for depression or mood disorders  The patient's weight, height, BMI, and visual acuity have been recorded in the chart.  I have made referrals, counseling, and provided education to the patient based on review of the above and I have provided the patient with a written personalized care plan for preventive services.     Vicie Mutters, PA-C   07/27/2018

## 2018-07-27 ENCOUNTER — Ambulatory Visit: Payer: PPO | Admitting: Physician Assistant

## 2018-07-27 ENCOUNTER — Encounter: Payer: Self-pay | Admitting: Physician Assistant

## 2018-07-27 ENCOUNTER — Other Ambulatory Visit: Payer: Self-pay

## 2018-07-27 VITALS — BP 123/68 | HR 66 | Temp 98.5°F | Ht 62.0 in | Wt 142.0 lb

## 2018-07-27 DIAGNOSIS — R6889 Other general symptoms and signs: Secondary | ICD-10-CM | POA: Diagnosis not present

## 2018-07-27 DIAGNOSIS — Z79899 Other long term (current) drug therapy: Secondary | ICD-10-CM | POA: Diagnosis not present

## 2018-07-27 DIAGNOSIS — M810 Age-related osteoporosis without current pathological fracture: Secondary | ICD-10-CM

## 2018-07-27 DIAGNOSIS — J302 Other seasonal allergic rhinitis: Secondary | ICD-10-CM | POA: Diagnosis not present

## 2018-07-27 DIAGNOSIS — I771 Stricture of artery: Secondary | ICD-10-CM

## 2018-07-27 DIAGNOSIS — Z0001 Encounter for general adult medical examination with abnormal findings: Secondary | ICD-10-CM | POA: Diagnosis not present

## 2018-07-27 DIAGNOSIS — I10 Essential (primary) hypertension: Secondary | ICD-10-CM | POA: Diagnosis not present

## 2018-07-27 DIAGNOSIS — E785 Hyperlipidemia, unspecified: Secondary | ICD-10-CM | POA: Diagnosis not present

## 2018-07-27 DIAGNOSIS — E559 Vitamin D deficiency, unspecified: Secondary | ICD-10-CM | POA: Diagnosis not present

## 2018-07-27 DIAGNOSIS — Z0000 Encounter for general adult medical examination without abnormal findings: Secondary | ICD-10-CM

## 2018-07-27 NOTE — Patient Instructions (Signed)
HYPERTENSION INFORMATION  HOW TO TAKE YOUR BLOOD PRESSURE: Rest 5 minutes before taking your blood pressure. Don't smoke or drink caffeinated beverages for at least 30 minutes before. Take your blood pressure before (not after) you eat. Sit comfortably with your back supported and both feet on the floor (don't cross your legs). Elevate your arm to heart level on a table or a desk. Use the proper sized cuff. It should fit smoothly and snugly around your bare upper arm. There should be enough room to slip a fingertip under the cuff. The bottom edge of the cuff should be 1 inch above the crease of the elbow.   INFORMATION ABOUT CBD OIL  The studies for CBD are better for seizure disorders than any other claims for CBD oil.  The biggest issues with it, is CBD oil is NOT regulated. A recent test of 18 over the counter CBD oil showed that 20% had TOO much, 60 % did not have the claimed amount, and 20 % had the claimed amount.   I have had some patients where it interacts with their medications as well and there is not a way for me to check the interactions since it is not a controlled substance.   With any other the counter medication OR supplement, if you try it, try to get from good source and if you have ANY abnormal symptoms over the next 3 months or don't see any benefits from it stop it.   The quality and quanity of the CBD oil varies greatly so I can not endorse patients taking it at this time.   Can use a nasal irrigation for your nose.   Can do a steroid nasal spary 1-2 sparys at night each nostril.  Such as nasonex or flonase or nasocort.  Remember to spray each nostril twice towards the outer part of your eye.   Do not sniff but instead pinch your nose and tilt your head back to help the medicine get into your sinuses.   The best time to do this is at bedtime.  Stop if you get blurred vision or nose bleeds.   THIS WILL TAKE 7 DAYS TO WORK AND IS BETTER IF YOU START BEFORE  SYMPTOMS SO IF YOU HAVE A SEASON OR TIME OF THE YEAR YOU ALWAYS GET A COLD, START BEFORE THAT!

## 2018-10-17 ENCOUNTER — Other Ambulatory Visit: Payer: Self-pay | Admitting: Internal Medicine

## 2018-10-17 DIAGNOSIS — I10 Essential (primary) hypertension: Secondary | ICD-10-CM

## 2018-10-29 DIAGNOSIS — M1711 Unilateral primary osteoarthritis, right knee: Secondary | ICD-10-CM | POA: Diagnosis not present

## 2018-10-29 DIAGNOSIS — M25561 Pain in right knee: Secondary | ICD-10-CM | POA: Diagnosis not present

## 2018-11-05 DIAGNOSIS — M1711 Unilateral primary osteoarthritis, right knee: Secondary | ICD-10-CM | POA: Diagnosis not present

## 2018-11-05 DIAGNOSIS — M25561 Pain in right knee: Secondary | ICD-10-CM | POA: Diagnosis not present

## 2018-11-12 DIAGNOSIS — M1711 Unilateral primary osteoarthritis, right knee: Secondary | ICD-10-CM | POA: Diagnosis not present

## 2018-11-12 DIAGNOSIS — M25561 Pain in right knee: Secondary | ICD-10-CM | POA: Diagnosis not present

## 2018-12-21 ENCOUNTER — Other Ambulatory Visit: Payer: Self-pay | Admitting: Internal Medicine

## 2019-01-02 ENCOUNTER — Other Ambulatory Visit: Payer: Self-pay | Admitting: Internal Medicine

## 2019-01-02 DIAGNOSIS — Z1231 Encounter for screening mammogram for malignant neoplasm of breast: Secondary | ICD-10-CM

## 2019-02-11 NOTE — Progress Notes (Signed)
CPE  Assessment:   Encounter for general adult medical examination with abnormal findings 1 year  Essential hypertension Atypically elevated today; she will monitor at home, start taking lasix 20 mg daily if consistently 130/80+, return for NV recheck in 2 weeks  - continue medications, DASH diet, exercise and monitor at home. Call if greater than 130/80.  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   Hyperlipidemia, unspecified hyperlipidemia type -     Lipid panel check lipids decrease fatty foods increase activity.   Medication management -     Magnesium  Vitamin D deficiency -     VITAMIN D 25 Hydroxy (Vit-D Deficiency, Fractures)  Seasonal allergic rhinitis, unspecified trigger Continue meds, singulaire and PRN zyrtec is working well   Osteoporosis without current pathological fracture, unspecified osteoporosis type In forearm only; hips T-2.0; continue calcium, vitamin D, weight bearing exercises Never started alendronate; after discussion will plan to initiate after next if no improvement Repeat DEXA 2 years - due 06/2019  Needs flu shot -     Flu vaccine HIGH DOSE PF   Future Appointments  Date Time Provider [place]  02/19/2019 10:40 AM [facility]  08/13/2019 11:15 AM Vicie Mutters, PA-C [facility] None  02/13/2020 10:00 AM Liane Comber, NP [facility] None      Subjective:  Michaela Kelley is a 78 y.o. female who presents for CPE and follow up for cholesterol, allergies, and vitamin D.   She is widowed, 2 daughters, 1 grandchild - 69 year old. Had covid but recovered well.   She has no concerns.   BMI is Body mass index is 26.77 kg/m., she has been working on diet  and exercise. She walks daily and golfs regularly, also doing light weights and resistance bands. Wt Readings from Last 3 Encounters:  02/12/19 144 lb (65.3 kg)  07/27/18 142 lb (64.4 kg)  02/01/18 144 lb 6.4 oz (65.5 kg)   She has broken blood vessels bilateral legs, worse left leg, she is taking 40 mg lasix q2-3 days, no swelling, atenolol 50 mg daily  Her blood pressure has been controlled at home, today their BP is BP: (!) 146/80. She does workout, walking, stretching and golfing.  She denies chest pain, shortness of breath, dizziness. She has orthotic for left foot pain, seeing Dr. Paulla Dolly.  Follows with [facility], Dr. Delilah Shan for knee gel injections.   Allergies are doing well at this time, mainly takes Singulair in the spring and fall.    She is not on cholesterol medication, on omega 3 supplement, she is off lipitor due to cramping and mild/mod elevations not aggressively pursued secondary to benign history. Her cholesterol is not at goal. The cholesterol last visit was:   Lab Results  Component Value Date   CHOL 239 (H) 02/01/2018   HDL 81 02/01/2018   LDLCALC 130 (H) 02/01/2018   TRIG 168 (H) 02/01/2018   CHOLHDL 3.0 02/01/2018    She has been working on diet and exercise for glucose management, and denies paresthesia of the feet, polydipsia, polyuria and visual disturbances. 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 aspirin EC 81 MG tablet Take 81 mg by mouth daily.      Marland Kitchen atenolol (TENORMIN) 50 MG tablet TAKE ONE TABLET EACH DAY FOR BLOOD PRESSURE 90 tablet 3  . b complex vitamins  capsule Take 1 capsule by mouth daily.    . Cholecalciferol (VITAMIN D PO) Take 4,000 Int'l Units by mouth daily.     . furosemide (LASIX) 40 MG tablet Take 1/2 to 1 tablet daily for BP and Fluid Retention / Ankle Swelling (Patient taking differently: Takes every 2-3 days) 90 tablet 3  . MAGNESIUM PO Take 150 mg by mouth daily.     . Omega-3 Fatty Acids (FISH OIL) 1000 MG CAPS Take by mouth daily.    . TURMERIC PO Take 800 mg by mouth 2 (two) times daily.     Marland Kitchen alendronate (FOSAMAX) 70 MG tablet Take 1 tablet (70 mg total) by mouth once a week. (Patient not taking: Reported on 02/12/2019) 4 tablet 3  . hydroCHLOROthiazide (MICROZIDE PO) hydrochlorothiazide    . montelukast (SINGULAIR) 10 MG tablet Take 1 tablet Daily for  Allergies (Patient not taking: Reported on 02/12/2019) 90 tablet 3  . PROAIR HFA 108 (90 Base) MCG/ACT inhaler INHALE 2 PUFFS INTO THE LUNGS EVERY 6 HOURS AS NEEDED FOR WHEEZING ORSHORTNESS OF BREATH (Patient not taking: Reported on 02/12/2019) 8.5 g 0   Current Facility-Administered Medications on File Prior to Visit  Medication Dose Route Frequency Provider Last Rate Last Dose  . triamcinolone acetonide (KENALOG) 10 MG/ML injection 10 mg  10 mg Other Once Wallene Huh, DPM        Current Problems (verified) Patient Active Problem List   Diagnosis Date Noted  . Osteoporosis 02/01/2018  . Tortuous aorta ([place]) 01/24/2017  . Medication management 10/03/2013  . Seasonal allergic rhinitis   . Vitamin D deficiency   . Hyperlipidemia 01/02/2008  . Essential hypertension 01/02/2008    Screening Tests Immunization History  Administered Date(s) Administered  . Influenza, High Dose Seasonal PF 03/10/2015, 01/10/2017, 02/01/2018  . Pneumococcal Conjugate-13 05/05/2014  . Pneumococcal Polysaccharide-23 06/28/2006  . Td 05/27/2011   Preventative care: Tetanus: 2013 Pneumovax: 2008 Prevnar 13: 2016 Flu vaccine: 2019, DUE TODAY  Zostavax: declines  Pap: 2011 normal-  declines another MGM:06/2017 has scheduled next week DEXA: Osteoporosis 06/2017 Young Adult T-Score:-2.8 forearm, hip -2.0 - never started alendronate, recheck DEXA 06/2019 and restart if needed  Colonoscopy: 2018 (Dr. Sharlett Iles) - tubular adenoma x 1 - ? 5 year follow up if desired  CT chest 2010 CXR 2017  Names of Other Physician/Practitioners you currently use: 1. [facility] here for primary care 2. Dr. Celene Squibb, eye doctor, 05/2018 3. Dr. Edsel Petrin, dentist, last 12/2018 4. Dr. Sharlett Iles, GI 5. [facility] 6. Dr. Marguerite Olea, derm  Patient Care Team: Unk Pinto, MD as PCP - General (Internal Medicine)    Allergies Allergies  Allergen Reactions  . Codeine Nausea And Vomiting and Other (See Comments)    Severe headaches (tolerates Dilaudid)  . Hydrocodone-Acetaminophen Nausea And Vomiting and  Other (See Comments)    Severe headaches (tolerates Dilaudid)  . Oxycodone-Acetaminophen Nausea And Vomiting and Other (See Comments)    Severe headaches (tolerates Dilaudid)  . Oxycodone-Aspirin Nausea And Vomiting and Other (See Comments)    Severe headaches (tolerates Dilaudid)    SURGICAL HISTORY She  has a past surgical history that includes Knee Arthroplasty (2010); Dilation and curettage of uterus; Tonsillectomy; Back surgery (1999); Total shoulder arthroplasty (02/10/2011); and Total shoulder arthroplasty (12/22/2011). FAMILY HISTORY Her family history includes Breast cancer in her paternal grandmother; Breast cancer (age of onset: 68) in her mother; Diabetes in her paternal grandfather; Heart disease in her brother, maternal grandfather, and maternal grandmother; Hypertension in her father; Lung cancer in her brother. SOCIAL HISTORY She  reports that she quit smoking about 27 years ago. Her smoking use included cigarettes. She has a 5.00 pack-year smoking history. She has never used smokeless tobacco. She reports current alcohol use of  about 8.0 standard drinks of alcohol per week. She reports that she does not use drugs.   Objective:     Blood pressure (!) 146/80, pulse 61, temperature (!) 97.3 F (36.3 C), height 5' 1.5" (1.562 m), weight 144 lb (65.3 kg), SpO2 99 %. Body mass index is 26.77 kg/m.  General appearance: alert, no distress, WD/WN, female HEENT: normocephalic, sclerae anicteric, TMs pearly, nares patent, no discharge or erythema, pharynx normal Oral cavity: MMM, no lesions Neck: supple, no lymphadenopathy, no thyromegaly, no masses Heart: RRR, normal S1, S2, no murmurs Lungs: CTA bilaterally, no wheezes, rhonchi, or rales Abdomen: +bs, soft, non tender, non distended, no masses, no hepatomegaly, no splenomegaly Musculoskeletal: nontender, no swelling, no obvious deformity Extremities: no edema, no cyanosis, no clubbing Pulses: 2+ symmetric, upper and lower extremities, normal cap refill Breasts: breasts appear normal, no suspicious masses, no skin or nipple changes or axillary nodes. Neurological: alert, oriented x 3, CN2-12 intact, strength normal upper extremities and lower extremities, sensation normal throughout, DTRs 2+ throughout, no cerebellar signs, gait normal Psychiatric: normal affect, behavior normal, pleasant   EKG:  Unchanged T wave inversion III and aVf Has had cardio work up in the past, no symptoms, continue to monitor  Michaela Ribas, NP   02/12/2019

## 2019-02-12 ENCOUNTER — Ambulatory Visit (INDEPENDENT_AMBULATORY_CARE_PROVIDER_SITE_OTHER): Payer: PPO | Admitting: Adult Health

## 2019-02-12 ENCOUNTER — Encounter: Payer: Self-pay | Admitting: Physician Assistant

## 2019-02-12 ENCOUNTER — Encounter: Payer: Self-pay | Admitting: Adult Health

## 2019-02-12 ENCOUNTER — Other Ambulatory Visit: Payer: Self-pay

## 2019-02-12 VITALS — BP 146/80 | HR 61 | Temp 97.3°F | Ht 61.5 in | Wt 144.0 lb

## 2019-02-12 DIAGNOSIS — M810 Age-related osteoporosis without current pathological fracture: Secondary | ICD-10-CM | POA: Diagnosis not present

## 2019-02-12 DIAGNOSIS — Z0000 Encounter for general adult medical examination without abnormal findings: Secondary | ICD-10-CM | POA: Diagnosis not present

## 2019-02-12 DIAGNOSIS — Z6825 Body mass index (BMI) 25.0-25.9, adult: Secondary | ICD-10-CM

## 2019-02-12 DIAGNOSIS — E785 Hyperlipidemia, unspecified: Secondary | ICD-10-CM

## 2019-02-12 DIAGNOSIS — J302 Other seasonal allergic rhinitis: Secondary | ICD-10-CM

## 2019-02-12 DIAGNOSIS — Z0001 Encounter for general adult medical examination with abnormal findings: Secondary | ICD-10-CM | POA: Diagnosis not present

## 2019-02-12 DIAGNOSIS — Z79899 Other long term (current) drug therapy: Secondary | ICD-10-CM | POA: Diagnosis not present

## 2019-02-12 DIAGNOSIS — I10 Essential (primary) hypertension: Secondary | ICD-10-CM

## 2019-02-12 DIAGNOSIS — E559 Vitamin D deficiency, unspecified: Secondary | ICD-10-CM | POA: Diagnosis not present

## 2019-02-12 DIAGNOSIS — Z136 Encounter for screening for cardiovascular disorders: Secondary | ICD-10-CM | POA: Diagnosis not present

## 2019-02-12 DIAGNOSIS — Z23 Encounter for immunization: Secondary | ICD-10-CM

## 2019-02-12 DIAGNOSIS — I771 Stricture of artery: Secondary | ICD-10-CM

## 2019-02-12 DIAGNOSIS — Z131 Encounter for screening for diabetes mellitus: Secondary | ICD-10-CM

## 2019-02-12 NOTE — Patient Instructions (Addendum)
[name] , Thank you for taking time to come for your Annual Wellness Visit. I appreciate your ongoing commitment to your health goals. Please review the following plan we discussed and let me know if I can assist you in the future.   These are the goals we discussed: Goals    . Blood Pressure < 130/80    . Exercise 150 min/wk Moderate Activity    . LDL CALC < 130    . Reduce alcohol intake     Limit to 1 glass a wine per day       This is a list of the screening recommended for you and due dates:  Health Maintenance  Topic Date Due  . Flu Shot  10/27/2018  . Tetanus Vaccine  05/26/2021  . DEXA scan (bone density measurement)  Completed  . Pneumonia vaccines  Completed      Preventing Osteoporosis, Adult Osteoporosis is a condition that causes the bones to get weaker. With osteoporosis, the bones become thinner, and the normal spaces in bone tissue become larger. This can make the bones weak and cause them to break more easily. People who have osteoporosis are more likely to break their wrist, spine, or hip. Even a minor accident or injury can be enough to break weak bones. Osteoporosis can occur with aging. Your body constantly replaces old bone tissue with new tissue. As you get older, you may lose bone tissue more quickly, or it may be replaced more slowly. Osteoporosis is more likely to develop if you have poor nutrition or do not get enough calcium or vitamin D. Other lifestyle factors can also play a role. By making some diet and lifestyle changes, you can help to keep your bones healthy and help to prevent osteoporosis. What nutrition changes can be made? Nutrition plays an important role in maintaining healthy, strong bones.  Make sure you get enough calcium every day from food or from calcium supplements. ? If you are age 21 or younger, aim to get 1,000 mg of calcium every day. ? If you are older than age 68, aim to get 1,200 mg of calcium every day.  Try to get  enough vitamin D every day. ? If you are age 11 or younger, aim to get 600 international units (IU) every day. ? If you are older than age 14, aim to get 800 international units every day.  Follow a healthy diet. Eat plenty of foods that contain calcium and vitamin D. ? Calcium is in milk, cheese, yogurt, and other dairy products. Some fish and vegetables are also good sources of calcium. Many foods such as cereals and breads have had calcium added to them (are fortified). Check nutrition labels to see how much calcium is in a food or drink. ? Foods that contain vitamin D include milk, cereals, salmon, and tuna. Your body also makes vitamin D when you are out in the sun. Bare skin exposure to the sun on your face, arms, legs, or back for no more than 30 minutes a day, 2 times per week is more than enough. Beyond that, it is important to use sunblock to protect your skin from sunburn, which increases your risk for skin cancer. What lifestyle changes can be made? Making changes in your everyday life can also play an important role in preventing osteoporosis.  Stay active and get exercise every day. Ask your health care provider what types of exercise are best for you.  Do not use any  products that contain nicotine or tobacco, such as cigarettes and e-cigarettes. If you need help quitting, ask your health care provider.  Limit alcohol intake to no more than 1 drink a day for nonpregnant women and 2 drinks a day for men. One drink equals 12 oz of beer, 5 oz of wine, or 1 oz of hard liquor. Why are these changes important? Making these nutrition and lifestyle changes can:  Help you develop and maintain healthy, strong bones.  Prevent loss of bone mass and the problems that are caused by that loss, such as broken bones and delayed healing.  Make you feel better mentally and physically. What can happen if changes are not made? Problems that can result from osteoporosis can be very serious. These  may include:  A higher risk of broken bones that are painful and do not heal well.  Physical malformations, such as a collapsed spine or a hunched back.  Problems with movement. Where to find support If you need help making changes to prevent osteoporosis, talk with your health care provider. You can ask for a referral to a diet and nutrition specialist (dietitian) and a physical therapist. Where to find more information Learn more about osteoporosis from:  NIH Osteoporosis and Related [place]: www.niams.GolfingGoddess.com.br  U.S. Office on Women's Health: SouvenirBaseball.es.html  National Osteoporosis Foundation: ProfilePeek.ch Summary  Osteoporosis is a condition that causes weak bones that are more likely to break.  Eating a healthy diet and making sure you get enough calcium and vitamin D can help prevent osteoporosis.  Other ways to reduce your risk of osteoporosis include getting regular exercise and avoiding alcohol and products that contain nicotine or tobacco. This information is not intended to replace advice given to you by your health care provider. Make sure you discuss any questions you have with your health care provider. Document Released: 03/29/2015 Document Revised: 02/24/2017 Document Reviewed: 11/23/2015 Elsevier Patient Education  [place].    Alendronate weekly tablets What is this medicine? ALENDRONATE (a LEN droe nate) slows calcium loss from bones. It helps to make healthy bone and to slow bone loss in people with osteoporosis. It may be used to treat Paget's disease. This medicine may be used for other purposes; ask your health care provider or pharmacist if you have questions. COMMON BRAND NAME(S): Fosamax What should I tell my health care provider before I take this medicine? They need to know  if you have any of these conditions:  esophagus, stomach, or intestine problems, like acid-reflux or GERD  dental disease  kidney disease  low blood calcium  low vitamin D  problems swallowing  problems sitting or standing for 30 minutes  an unusual or allergic reaction to alendronate, other medicines, foods, dyes, or preservatives  pregnant or trying to get pregnant  breast-feeding How should I use this medicine? You must take this medicine exactly as directed or you will lower the amount of medicine you absorb into your body or you may cause yourself harm. Take your dose by mouth first thing in the morning, after you are up for the day. Do not eat or drink anything before you take this medicine. Swallow your medicine with a full glass (6 to 8 fluid ounces) of plain water. Do not take this tablet with any other drink. Do not chew or crush the tablet. After taking this medicine, do not eat breakfast, drink, or take any medicines or vitamins for at least 30 minutes. Stand or sit  up for at least 30 minutes after you take this medicine; do not lie down. Take this medicine on the same day every week. Do not take your medicine more often than directed. Talk to your pediatrician regarding the use of this medicine in children. Special care may be needed. Overdosage: If you think you have taken too much of this medicine contact a poison control center or emergency room at once. NOTE: This medicine is only for you. Do not share this medicine with others. What if I miss a dose? If you miss a dose, take the dose on the morning after you remember. Then take your next dose on your regular day of the week. Never take 2 tablets on the same day. Do not take double or extra doses. What may interact with this medicine?  aluminum hydroxide  antacids  aspirin  calcium supplements  drugs for inflammation like ibuprofen, naproxen, and others  iron supplements  magnesium supplements  vitamins  with minerals This list may not describe all possible interactions. Give your health care provider a list of all the medicines, herbs, non-prescription drugs, or dietary supplements you use. Also tell them if you smoke, drink alcohol, or use illegal drugs. Some items may interact with your medicine. What should I watch for while using this medicine? Visit your doctor or health care professional for regular checks ups. It may be some time before you see benefit from this medicine. Do not stop taking your medication except on your doctor's advice. Your doctor or health care professional may order blood tests and other tests to see how you are doing. You should make sure you get enough calcium and vitamin D while you are taking this medicine, unless your doctor tells you not to. Discuss the foods you eat and the vitamins you take with your health care professional. Some people who take this medicine have severe bone, joint, and/or muscle pain. This medicine may also increase your risk for a broken thigh bone. Tell your doctor right away if you have pain in your upper leg or groin. Tell your doctor if you have any pain that does not go away or that gets worse. This medicine can make you more sensitive to the sun. If you get a rash while taking this medicine, sunlight may cause the rash to get worse. Keep out of the sun. If you cannot avoid being in the sun, wear protective clothing and use sunscreen. Do not use sun lamps or tanning beds/booths. What side effects may I notice from receiving this medicine? Side effects that you should report to your doctor or health care professional as soon as possible:  allergic reactions like skin rash, itching or hives, swelling of the face, lips, or tongue  black or tarry stools  bone, muscle or joint pain  changes in vision  chest pain  heartburn or stomach pain  jaw pain, especially after dental work  pain or trouble when swallowing  redness, blistering,  peeling or loosening of the skin, including inside the mouth Side effects that usually do not require medical attention (report to your doctor or health care professional if they continue or are bothersome):  changes in taste  diarrhea or constipation  eye pain or itching  headache  nausea or vomiting  stomach gas or fullness This list may not describe all possible side effects. Call your doctor for medical advice about side effects. You may report side effects to FDA at 1-800-FDA-1088. Where should I keep my medicine?  Keep out of the reach of children. Store at room temperature of 15 and 30 degrees C (59 and 86 degrees F). Throw away any unused medicine after the expiration date. NOTE: This sheet is a summary. It may not cover all possible information. If you have questions about this medicine, talk to your doctor, pharmacist, or health care provider.  2020 Elsevier/Gold Standard (2011-01-27 09:02:42)

## 2019-02-13 LAB — HEMOGLOBIN A1C
Hgb A1c MFr Bld: 5.3 % of total Hgb (ref <5.7)
Mean Plasma Glucose: 105 (calc)
eAG (mmol/L): 5.8 (calc)

## 2019-02-13 LAB — CBC WITH DIFFERENTIAL/PLATELET
Absolute Monocytes: 650 cells/uL (ref 200–950)
Basophils Absolute: 29 cells/uL (ref 0–200)
Basophils Relative: 0.5 %
Eosinophils Absolute: 143 cells/uL (ref 15–500)
Eosinophils Relative: 2.5 %
HCT: 36.6 % (ref 35.0–45.0)
Hemoglobin: 12.3 g/dL (ref 11.7–15.5)
Lymphs Abs: 2098 cells/uL (ref 850–3900)
MCH: 31.4 pg (ref 27.0–33.0)
MCHC: 33.6 g/dL (ref 32.0–36.0)
MCV: 93.4 fL (ref 80.0–100.0)
MPV: 9.1 fL (ref 7.5–12.5)
Monocytes Relative: 11.4 %
Neutro Abs: 2782 cells/uL (ref 1500–7800)
Neutrophils Relative %: 48.8 %
Platelets: 310 10*3/uL (ref 140–400)
RBC: 3.92 10*6/uL (ref 3.80–5.10)
RDW: 12.7 % (ref 11.0–15.0)
Total Lymphocyte: 36.8 %
WBC: 5.7 10*3/uL (ref 3.8–10.8)

## 2019-02-13 LAB — COMPLETE METABOLIC PANEL WITH GFR
AG Ratio: 1.7 (calc) (ref 1.0–2.5)
ALT: 27 U/L (ref 6–29)
AST: 22 U/L (ref 10–35)
Albumin: 4.5 g/dL (ref 3.6–5.1)
Alkaline phosphatase (APISO): 65 U/L (ref 37–153)
BUN: 14 mg/dL (ref 7–25)
CO2: 28 mmol/L (ref 20–32)
Calcium: 10.1 mg/dL (ref 8.6–10.4)
Chloride: 98 mmol/L (ref 98–110)
Creat: 0.81 mg/dL (ref 0.60–0.93)
GFR, Est African American: 81 mL/min/{1.73_m2} (ref >=60)
GFR, Est Non African American: 70 mL/min/{1.73_m2} (ref >=60)
Globulin: 2.7 g/dL (calc) (ref 1.9–3.7)
Glucose, Bld: 103 mg/dL — ABNORMAL HIGH (ref 65–99)
Potassium: 4.5 mmol/L (ref 3.5–5.3)
Sodium: 134 mmol/L — ABNORMAL LOW (ref 135–146)
Total Bilirubin: 0.6 mg/dL (ref 0.2–1.2)
Total Protein: 7.2 g/dL (ref 6.1–8.1)

## 2019-02-13 LAB — LIPID PANEL
Cholesterol: 229 mg/dL — ABNORMAL HIGH (ref <200)
HDL: 76 mg/dL (ref >=50)
LDL Cholesterol (Calc): 125 mg/dL (calc) — ABNORMAL HIGH
Non-HDL Cholesterol (Calc): 153 mg/dL (calc) — ABNORMAL HIGH (ref <130)
Total CHOL/HDL Ratio: 3 (calc) (ref <5.0)
Triglycerides: 160 mg/dL — ABNORMAL HIGH (ref <150)

## 2019-02-13 LAB — URINALYSIS, ROUTINE W REFLEX MICROSCOPIC
Bilirubin Urine: NEGATIVE
Glucose, UA: NEGATIVE
Hgb urine dipstick: NEGATIVE
Ketones, ur: NEGATIVE
Leukocytes,Ua: NEGATIVE
Nitrite: NEGATIVE
Protein, ur: NEGATIVE
Specific Gravity, Urine: 1.015 (ref 1.001–1.03)
pH: 7.5 (ref 5.0–8.0)

## 2019-02-13 LAB — MAGNESIUM: Magnesium: 1.9 mg/dL (ref 1.5–2.5)

## 2019-02-13 LAB — MICROALBUMIN / CREATININE URINE RATIO
Creatinine, Urine: 89 mg/dL (ref 20–275)
Microalb Creat Ratio: 9 mcg/mg creat (ref <30)
Microalb, Ur: 0.8 mg/dL

## 2019-02-13 LAB — TSH: TSH: 1.66 mIU/L (ref 0.40–4.50)

## 2019-02-13 LAB — VITAMIN D 25 HYDROXY (VIT D DEFICIENCY, FRACTURES): Vit D, 25-Hydroxy: 61 ng/mL (ref 30–100)

## 2019-02-19 ENCOUNTER — Other Ambulatory Visit: Payer: Self-pay

## 2019-02-19 ENCOUNTER — Ambulatory Visit
Admission: RE | Admit: 2019-02-19 | Discharge: 2019-02-19 | Disposition: A | Discharge status: Home / Self Care | Payer: PPO | Source: Ambulatory Visit | Attending: Internal Medicine | Admitting: Internal Medicine

## 2019-02-19 DIAGNOSIS — Z1231 Encounter for screening mammogram for malignant neoplasm of breast: Secondary | ICD-10-CM

## 2019-02-19 IMAGING — MG DIGITAL SCREENING BILAT W/ TOMO W/ CAD
8 series · 8 of 24 positions shown · non-contrast
Comparison: Previous exam(s).

CLINICAL DATA: Screening.

EXAM:
DIGITAL SCREENING BILATERAL MAMMOGRAM WITH TOMO AND CAD

[L MLO synth-2D]
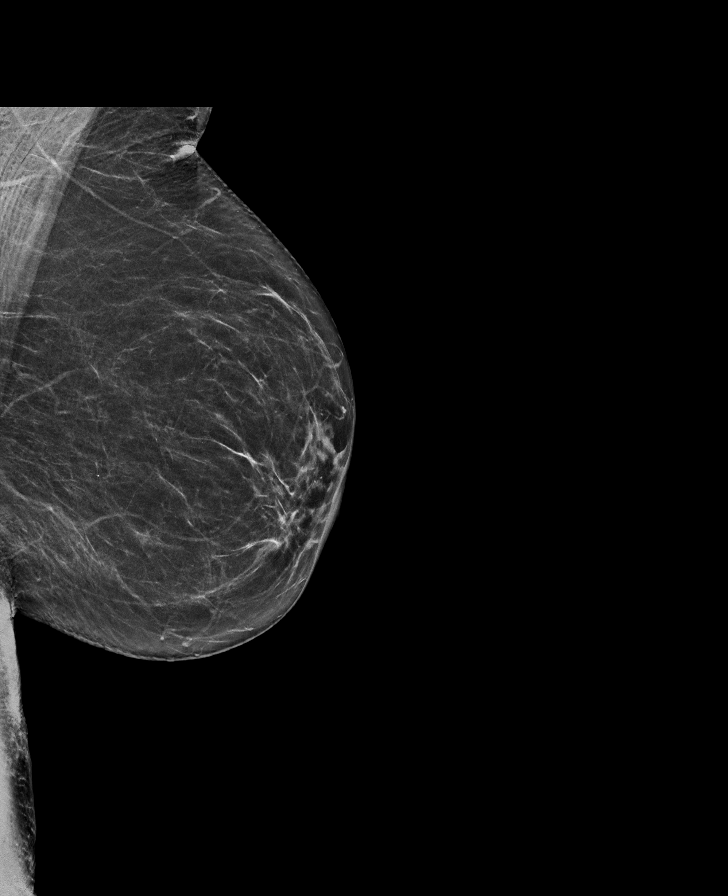

[L CC synth-2D]
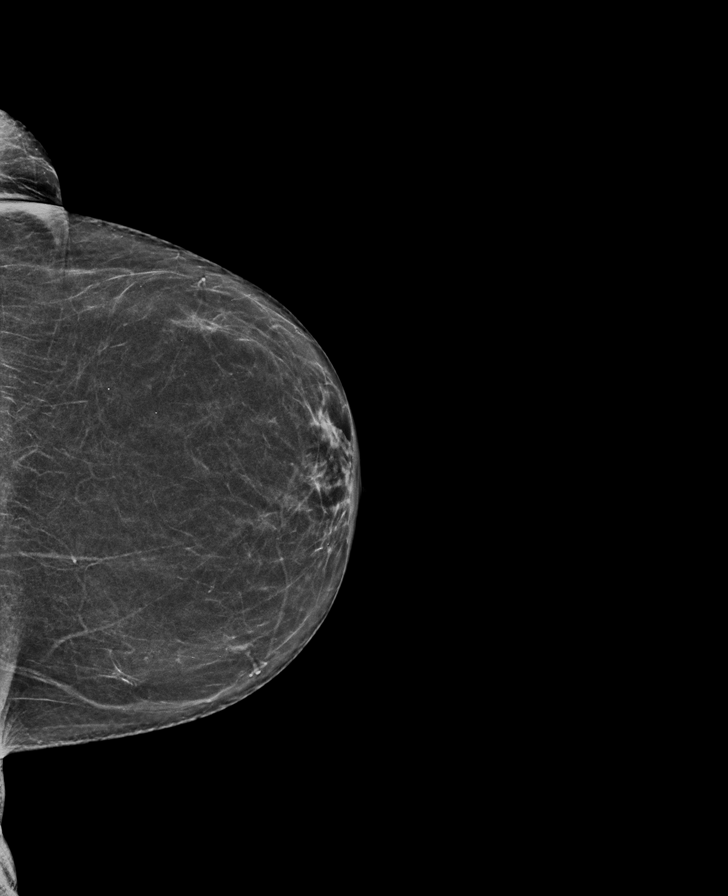

[R CC synth-2D]
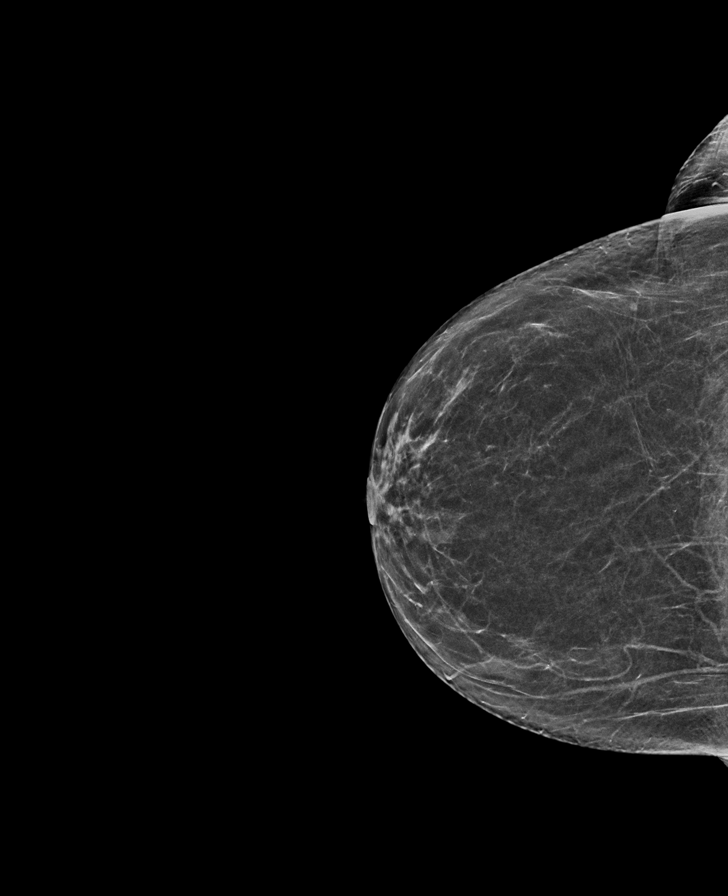

[R MLO synth-2D]
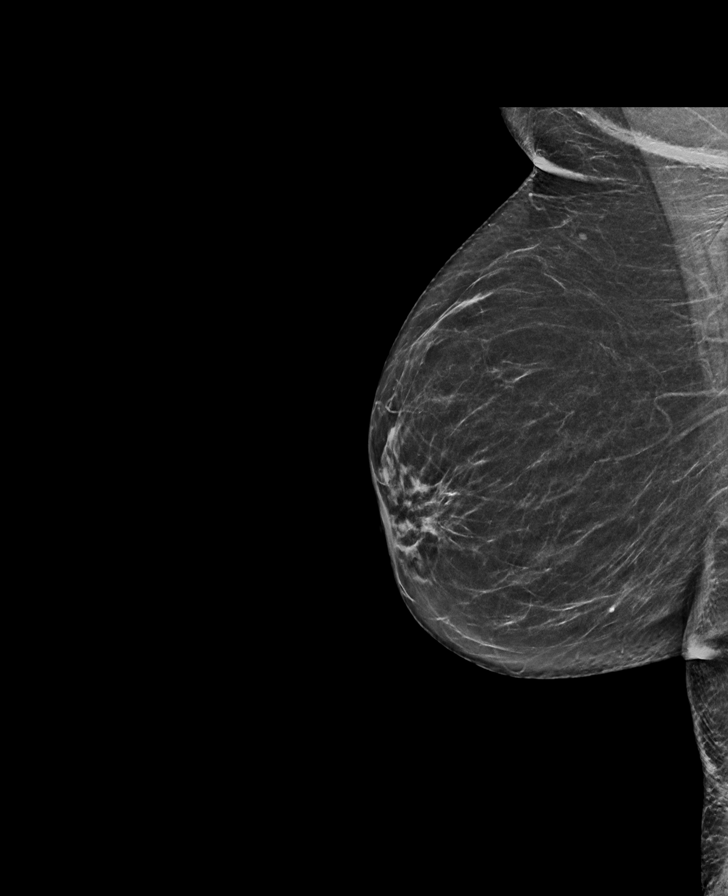

[R MLO tomo · tomo slice 29/58.0]
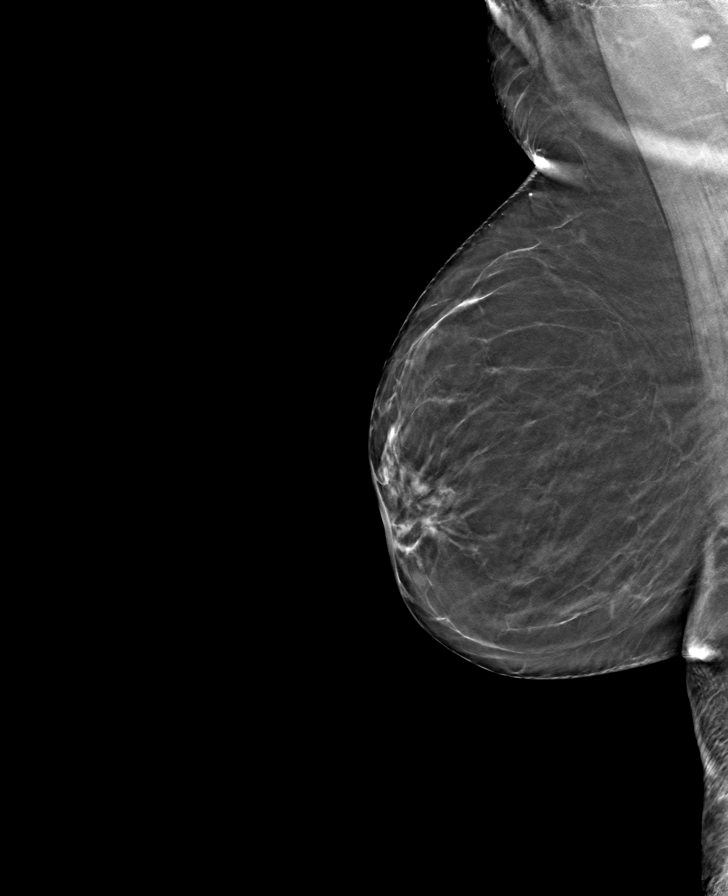

[R CC tomo · tomo slice 29/57.0]
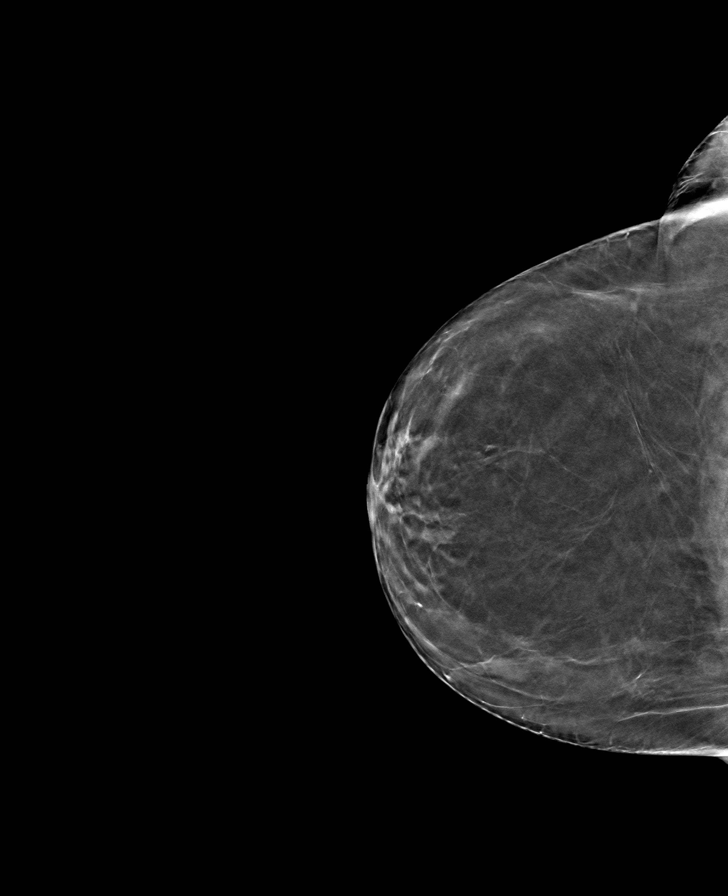

[L MLO tomo · tomo slice 31/60.0]
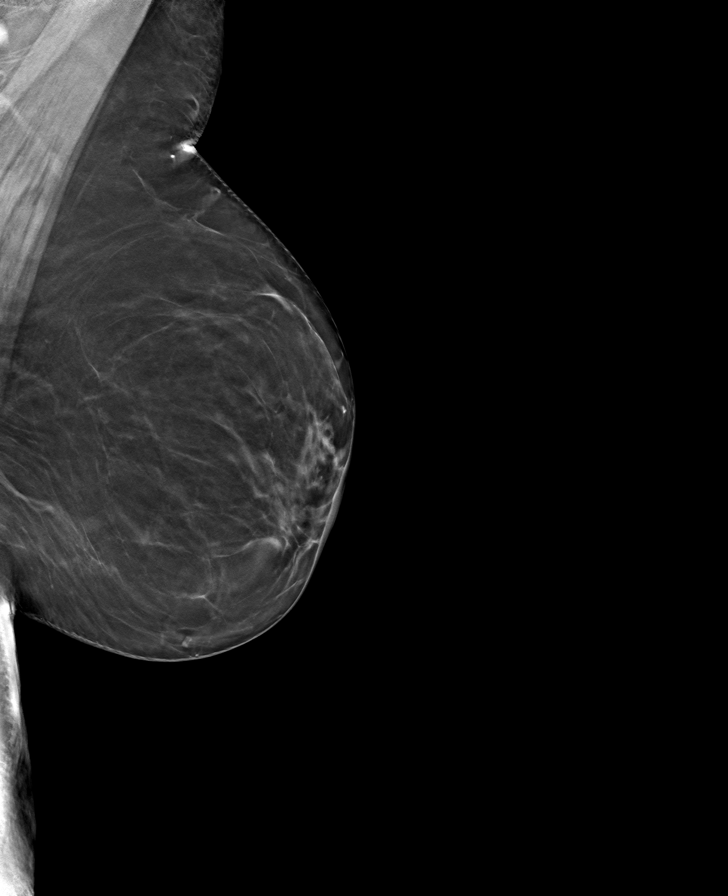

[L CC tomo · tomo slice 29/56.0]
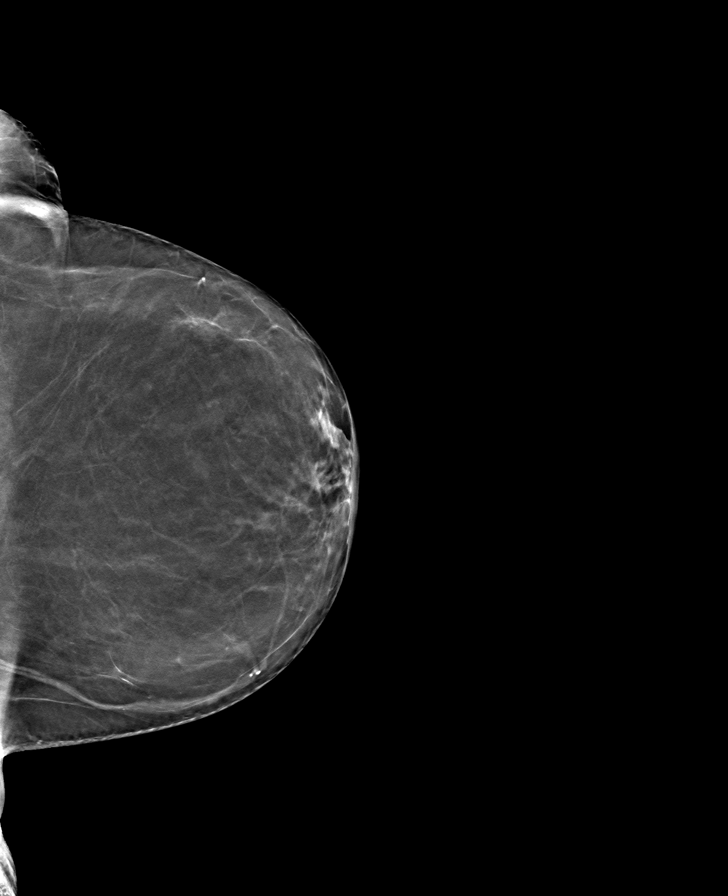

[8 of 24 positions shown; findings below may reference images not displayed]

ACR Breast Density Category b: There are scattered areas of
fibroglandular density.
FINDINGS: There are no findings suspicious for malignancy. Images were
processed with CAD.
IMPRESSION: No mammographic evidence of malignancy. A result letter of this
screening mammogram will be mailed directly to the patient.

RECOMMENDATION:
Screening mammogram in one year. (Code:[TQ])

BI-RADS CATEGORY  1: Negative.

## 2019-02-26 ENCOUNTER — Other Ambulatory Visit: Payer: Self-pay

## 2019-02-26 ENCOUNTER — Ambulatory Visit: Payer: PPO

## 2019-03-07 DIAGNOSIS — L570 Actinic keratosis: Secondary | ICD-10-CM | POA: Diagnosis not present

## 2019-05-21 ENCOUNTER — Other Ambulatory Visit: Payer: Self-pay | Admitting: Internal Medicine

## 2019-05-21 DIAGNOSIS — I10 Essential (primary) hypertension: Secondary | ICD-10-CM

## 2019-05-21 DIAGNOSIS — R609 Edema, unspecified: Secondary | ICD-10-CM

## 2019-06-18 DIAGNOSIS — M25561 Pain in right knee: Secondary | ICD-10-CM | POA: Diagnosis not present

## 2019-06-18 DIAGNOSIS — M1711 Unilateral primary osteoarthritis, right knee: Secondary | ICD-10-CM | POA: Diagnosis not present

## 2019-07-03 ENCOUNTER — Encounter: Payer: Self-pay | Admitting: Adult Health

## 2019-07-03 DIAGNOSIS — M1711 Unilateral primary osteoarthritis, right knee: Secondary | ICD-10-CM | POA: Insufficient documentation

## 2019-07-03 DIAGNOSIS — M25561 Pain in right knee: Secondary | ICD-10-CM | POA: Diagnosis not present

## 2019-07-31 DIAGNOSIS — H524 Presbyopia: Secondary | ICD-10-CM | POA: Diagnosis not present

## 2019-07-31 DIAGNOSIS — H35372 Puckering of macula, left eye: Secondary | ICD-10-CM | POA: Diagnosis not present

## 2019-07-31 DIAGNOSIS — Z961 Presence of intraocular lens: Secondary | ICD-10-CM | POA: Diagnosis not present

## 2019-07-31 DIAGNOSIS — D23112 Other benign neoplasm of skin of right lower eyelid, including canthus: Secondary | ICD-10-CM | POA: Diagnosis not present

## 2019-08-12 NOTE — Progress Notes (Signed)
WELLNESS  Assessment:   WELLNESS 1 year  Essential hypertension - continue medications, DASH diet, exercise and monitor at home. Call if greater than 130/80.  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   Hyperlipidemia, unspecified hyperlipidemia type -     Lipid panel check lipids decrease fatty foods increase activity.   Medication management -     Magnesium  Vitamin D deficiency -     VITAMIN D 25 Hydroxy (Vit-D Deficiency, Fractures)  Seasonal allergic rhinitis, unspecified trigger Continue meds, singulaire and PRN zyrtec is working well   Osteoporosis without current pathological fracture, unspecified osteoporosis type In forearm only; hips T-2.0; continue calcium, vitamin D, weight bearing exercises Never started alendronate;  Repeat DEXA 2 years - due with next [facility]  Future Appointments  Date Time Provider [place]  02/13/2020 10:00 AM Liane Comber, NP [facility] None   MEDICARE WELLNESS OBJECTIVES: Physical activity: Current Exercise Habits: Home exercise routine, Type of exercise: walking, Time (Minutes): 25, Frequency (Times/Week): 4, Weekly Exercise (Minutes/Week): 100, Intensity: Moderate Cardiac risk factors: Cardiac Risk Factors include: advanced age (>52men, >65 women);dyslipidemia;hypertension;sedentary lifestyle Depression/mood screen:   Depression screen [facility] 2/9 08/14/2019  Decreased Interest 0  Down, Depressed, Hopeless 0  PHQ - 2 Score 0    ADLs:  In your present state of health, do you have any difficulty performing the following activities: 08/14/2019  Hearing? N  Comment has hearing aids  Vision? N  Difficulty concentrating or making decisions? N  Walking or climbing stairs? N  Dressing or bathing? N  Doing errands, shopping? N  Some recent data might be hidden     Cognitive  Testing  Alert? Yes  Normal Appearance?Yes  Oriented to person? Yes  Place? Yes   Time? Yes  Recall of three objects?  Yes  Can perform simple calculations? Yes  Displays appropriate judgment?Yes  Can read the correct time from a watch face?Yes  EOL planning: Does Patient Have a Medical Advance Directive?: Yes Type of Advance Directive: Healthcare Power of Attorney, Living will Does patient want to make changes to medical advance directive?: No - Patient declined Copy of [place] in Chart?: No - copy requested    Medicare Attestation I have personally reviewed: The patient's medical and social history Their use of alcohol, tobacco or illicit drugs Their current medications and supplements The patient's functional ability including ADLs,fall risks, home safety risks, cognitive, and hearing and visual impairment Diet and physical activities Evidence for depression or mood disorders  The patient's weight, height, BMI, and visual acuity have been recorded in the chart.  I have made referrals, counseling, and provided education to the patient based on review of the above and I have provided the patient with a written personalized care plan for preventive services.      Subjective:  Michaela Kelley is a 79 y.o. female who presents for wellness and follow up for cholesterol, allergies, and vitamin D.   She is widowed, 2 daughters, 1 grandchild - 58 year old. Had covid but recovered well.   She has no concerns.   BMI is Body mass index is 27.62 kg/m., she has been working on diet and exercise. She walks daily and golfs regularly, also doing light weights and resistance bands. Wt Readings from Last 3 Encounters:  08/14/19 146 lb  3.2 oz (66.3 kg)  02/12/19 144 lb (65.3 kg)  07/27/18 142 lb (64.4 kg)   She has broken blood vessels bilateral legs, worse left leg, she is taking 40 mg lasix q2-3 days, no swelling, atenolol 50 mg daily  Her blood pressure has been  controlled at home, today their BP is BP: 126/74. She does workout, walking, stretching and golfing.  She denies chest pain, shortness of breath, dizziness. She has orthotic for left foot pain, seeing Dr. Paulla Dolly.  Follows with [facility], Dr. Delilah Shan for knee gel injections.   Allergies are doing well at this time, mainly takes Singulair in the spring and fall.    She is not on cholesterol medication, on omega 3 supplement, she is off lipitor due to cramping and mild/mod elevations not aggressively pursued secondary to benign history. Her cholesterol is not at goal. The cholesterol last visit was:   Lab Results  Component Value Date   CHOL 229 (H) 02/12/2019   HDL 76 02/12/2019   LDLCALC 125 (H) 02/12/2019   TRIG 160 (H) 02/12/2019   CHOLHDL 3.0 02/12/2019    She has been working on diet and exercise for glucose management, and denies paresthesia of the feet, polydipsia, polyuria and visual disturbances. 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 aspirin EC 81 MG tablet Take 81 mg by mouth daily.      Marland Kitchen atenolol (TENORMIN) 50 MG tablet TAKE ONE TABLET EACH DAY FOR BLOOD PRESSURE 90 tablet 3  . b complex vitamins capsule Take 1 capsule by mouth daily.    . Cholecalciferol (VITAMIN D PO) Take 4,000 Int'l Units by mouth daily.     . furosemide (LASIX) 40 MG tablet Take 1 tablet Daily for BP & Fluid Retention / Ankle Swelling 90 tablet 3  . MAGNESIUM PO Take 150 mg by mouth daily.     . montelukast (SINGULAIR) 10 MG tablet Take 1 tablet Daily for  Allergies 90 tablet 3  . Omega-3 Fatty Acids (FISH  OIL) 1000 MG CAPS Take by mouth daily.    Marland Kitchen PROAIR HFA 108 (90 Base) MCG/ACT inhaler INHALE 2 PUFFS INTO THE LUNGS EVERY 6 HOURS AS NEEDED FOR WHEEZING ORSHORTNESS OF BREATH 8.5 g 0  . TURMERIC PO Take 800 mg by mouth 2 (two) times daily.     . Celecoxib (CELEBREX PO) Take 200 mg by mouth.     Current Facility-Administered Medications on File Prior to Visit  Medication Dose Route Frequency Provider Last Rate Last Admin  . triamcinolone acetonide (KENALOG) 10 MG/ML injection 10 mg  10 mg Other Once Wallene Huh, DPM        Current Problems (verified) Patient Active Problem List   Diagnosis Date Noted  . Osteoarthritis of right knee 07/03/2019  . Osteoporosis 02/01/2018  . 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 Medication management 10/03/2013  . Seasonal allergic rhinitis   . Vitamin D deficiency   . Hyperlipidemia 01/02/2008  . Essential hypertension 01/02/2008    Screening Tests Immunization History  Administered Date(s) Administered  . Influenza, High Dose Seasonal PF 03/10/2015, 01/10/2017, 02/01/2018, 02/12/2019  . PFIZER SARS-COV-2 Vaccination 04/16/2019, 05/07/2019  . Pneumococcal Conjugate-13 05/05/2014  . Pneumococcal Polysaccharide-23 06/28/2006  . Td 05/27/2011   Preventative care: Tetanus: 2013 Pneumovax: 2008 Prevnar 13: 2016 Flu vaccine: 2020  Shingrix- given information COVID vaccines completed  Pap: 2011 normal- declines another MGM:01/2019 DEXA: Osteoporosis 06/2017 Young Adult T-Score:-2.8 forearm, hip -2.0 - never started alendronate, recheck DEXA 06/2019 and restart if needed  Colonoscopy: 2018 (Dr. Sharlett Iles) - tubular adenoma- 5 yr fu due 2023 CT chest 2010- 5 pack year smoking history quit in 1993 CXR 2017  Names of Other Physician/Practitioners you currently use: 1. [facility] here for primary care 2. Dr. Celene Squibb, eye doctor, 05/2019 3. Dr. Edsel Petrin, dentist, last feb 2021 4. Dr. Sharlett Iles, GI 5. [facility] 6. Dr. Marguerite Olea, derm  Patient Care Team: Unk Pinto, MD as PCP - General (Internal Medicine)    Allergies Allergies  Allergen Reactions  . Codeine Nausea And Vomiting and Other (See Comments)    Severe headaches (tolerates Dilaudid)  . Hydrocodone-Acetaminophen Nausea And Vomiting and Other (See Comments)    Severe headaches (tolerates Dilaudid)  . Oxycodone-Acetaminophen Nausea And Vomiting and Other (See Comments)    Severe headaches (tolerates Dilaudid)  . Oxycodone-Aspirin Nausea And Vomiting and Other (See Comments)    Severe headaches (tolerates Dilaudid)    SURGICAL HISTORY She  has a past surgical history that includes Knee Arthroplasty (2010); Dilation and curettage of uterus; Tonsillectomy; Back surgery (1999); Total shoulder arthroplasty (02/10/2011); and Total shoulder arthroplasty (12/22/2011). FAMILY HISTORY Her family history includes Breast cancer in her paternal grandmother; Breast cancer (age of onset: 70) in her mother; Diabetes in her paternal grandfather; Heart disease in her brother, maternal grandfather, and maternal grandmother; Hypertension in her father; Lung cancer in her brother. SOCIAL HISTORY She  reports that she quit smoking about 27 years ago. Her smoking use included cigarettes. She has a 5.00 pack-year smoking history. She has never used smokeless tobacco. She reports current alcohol use of about 8.0 standard drinks of alcohol per week. She reports that she does not use drugs.   Objective:     Blood pressure 126/74, pulse 63, temperature 98.2 F (36.8 C), height 5\' 1"  (1.549 m), weight 146 lb 3.2 oz (66.3 kg), SpO2 96 %. Body mass index is 27.62 kg/m.  General appearance: alert, no distress, WD/WN, female HEENT: normocephalic, sclerae anicteric, TMs pearly, nares patent, no discharge or erythema, pharynx normal Oral cavity: MMM, no lesions Neck: supple, no lymphadenopathy, no thyromegaly, no masses Heart: RRR, normal S1,  S2, no murmurs Lungs: CTA bilaterally, no wheezes, rhonchi, or rales Abdomen: +bs, soft, non tender, non distended, no masses, no hepatomegaly, no splenomegaly Musculoskeletal: nontender, no swelling, no obvious deformity Extremities: no edema, no cyanosis, no clubbing Pulses: 2+ symmetric, upper and lower extremities, normal cap refill, + varicose veins Breasts: breasts appear normal, no suspicious masses, no skin or nipple changes or axillary nodes. Neurological: alert, oriented x 3, CN2-12 intact, strength normal upper extremities and lower extremities, sensation normal throughout, DTRs 2+ throughout, no cerebellar signs, gait normal Psychiatric: normal affect, behavior normal, pleasant    Vicie Mutters, PA-C   08/14/2019

## 2019-08-13 ENCOUNTER — Ambulatory Visit: Payer: PPO | Admitting: Physician Assistant

## 2019-08-14 ENCOUNTER — Other Ambulatory Visit: Payer: Self-pay

## 2019-08-14 ENCOUNTER — Encounter: Payer: Self-pay | Admitting: Physician Assistant

## 2019-08-14 ENCOUNTER — Ambulatory Visit (INDEPENDENT_AMBULATORY_CARE_PROVIDER_SITE_OTHER): Payer: PPO | Admitting: Physician Assistant

## 2019-08-14 VITALS — BP 126/74 | HR 63 | Temp 98.2°F | Ht 61.0 in | Wt 146.2 lb

## 2019-08-14 DIAGNOSIS — E538 Deficiency of other specified B group vitamins: Secondary | ICD-10-CM

## 2019-08-14 DIAGNOSIS — Z0001 Encounter for general adult medical examination with abnormal findings: Secondary | ICD-10-CM | POA: Diagnosis not present

## 2019-08-14 DIAGNOSIS — I10 Essential (primary) hypertension: Secondary | ICD-10-CM | POA: Diagnosis not present

## 2019-08-14 DIAGNOSIS — E785 Hyperlipidemia, unspecified: Secondary | ICD-10-CM | POA: Diagnosis not present

## 2019-08-14 DIAGNOSIS — M1711 Unilateral primary osteoarthritis, right knee: Secondary | ICD-10-CM | POA: Diagnosis not present

## 2019-08-14 DIAGNOSIS — E559 Vitamin D deficiency, unspecified: Secondary | ICD-10-CM | POA: Diagnosis not present

## 2019-08-14 DIAGNOSIS — Z0000 Encounter for general adult medical examination without abnormal findings: Secondary | ICD-10-CM

## 2019-08-14 DIAGNOSIS — Z79899 Other long term (current) drug therapy: Secondary | ICD-10-CM

## 2019-08-14 DIAGNOSIS — D649 Anemia, unspecified: Secondary | ICD-10-CM

## 2019-08-14 DIAGNOSIS — R6889 Other general symptoms and signs: Secondary | ICD-10-CM | POA: Diagnosis not present

## 2019-08-14 DIAGNOSIS — J302 Other seasonal allergic rhinitis: Secondary | ICD-10-CM | POA: Diagnosis not present

## 2019-08-14 DIAGNOSIS — I771 Stricture of artery: Secondary | ICD-10-CM

## 2019-08-14 DIAGNOSIS — M810 Age-related osteoporosis without current pathological fracture: Secondary | ICD-10-CM | POA: Diagnosis not present

## 2019-08-14 NOTE — Patient Instructions (Addendum)
Let us know if you have any chest pain, shortness of breath or palpations with exertion   Lasix can cause dehydration which can cause low blood pressure and increase in your heart rate, monitor for this  VARICOSE VEINS Varicose veins are veins that have become enlarged and twisted. CAUSES This condition is the result of valves in the veins not working properly. Valves in the veins help return blood from the leg to the heart. When your calf muscles squeeze, the blood moves up your leg then the valves close and this continues until the blood gets back to your heart.  If these valves are damaged, blood flows backwards and backs up into the veins in the leg near the skin OR if your are sitting/standing for a long time without using your calf muscles the blood will back up into the veins in your legs. This causes the veins to become larger. People who are on their feet a lot, sit a lot without walking (like on a plane, at a desk, or in a car), who are pregnant, or who are overweight are more likely to develop varicose veins. SYMPTOMS   Bulging, twisted-appearing, bluish veins, most commonly found on the legs.  Leg pain or a feeling of heaviness. These symptoms may be worse at the end of the day.  Leg swelling.  Skin color changes. DIAGNOSIS  Varicose veins can usually be diagnosed with an exam of your legs by your caregiver. He or she may recommend an ultrasound of your leg veins. TREATMENT  Most varicose veins can be treated at home. However, other treatments are available for people who have persistent symptoms or who want to treat the cosmetic appearance of the varicose veins. But this is only cosmetic and they will return if not properly treated. These include:  Laser treatment of very small varicose veins.  Medicine that is shot (injected) into the vein. This medicine hardens the walls of the vein and closes off the vein. This treatment is called sclerotherapy. Afterwards, you may need to  wear clothing or bandages that apply pressure.  Surgery. HOME CARE INSTRUCTIONS   Do not stand or sit in one position for long periods of time. Do not sit with your legs crossed. Rest with your legs raised during the day.  Your legs have to be higher than your heart so that gravity will force the valves to open, so please really elevate your legs.   Wear elastic stockings or support hose. Do not wear other tight, encircling garments around the legs, pelvis, or waist.  [employer]  has a wide variety of well priced compression stockings. [place], [place] [address] [phone]  OR THERE ARE COPPER INFUSED COMPRESSION SOCKS AT [facility] OR [facility]  [employer] also has great cheap/afforable stockings or socks- the socks are easier to get on your feet  - can also get a jacob's donner that helps you put on the sock  Walk as much as possible to increase blood flow.  Raise the foot of your bed at night with 2-inch blocks.  If you get a cut in the skin over the vein and the vein bleeds, lie down with your leg raised and press on it with a clean cloth until the bleeding stops. Then place a bandage (dressing) on the cut. See your caregiver if it continues to bleed or needs stitches. SEEK MEDICAL CARE IF:   The skin around your ankle starts to break down.  You have pain,  redness, tenderness, or hard swelling developing in your leg over a vein.  You are uncomfortable due to leg pain. Document Released: 12/22/2004 Document Revised: 06/06/2011 Document Reviewed: 05/10/2010 [facility] Patient Information 2014 [place].    Ask insurance and pharmacy about shingrix - it is a 2 part shot that we will not be getting in the office.   Suggest getting AFTER covid vaccines, have to wait at least a month This shot can make you feel bad due to such good immune response it can trigger some inflammation so take tylenol or aleve day of or day after and plan on resting.   Can go to  [email or web address] for more information  Shingrix Vaccination  Two vaccines are licensed and recommended to prevent shingles in the [place] Zoster vaccine live (ZVL, Zostavax) has been in use since 2006. Recombinant zoster vaccine (RZV, Shingrix), has been in use since 2017 and is recommended by [employer] as the preferred shingles vaccine.  What Everyone Should Know about Shingles Vaccine (Shingrix) One of the Recommended Vaccines by Disease Shingles vaccination is the only way to protect against shingles and postherpetic neuralgia (PHN), the most common complication from shingles. CDC recommends that healthy adults 50 years and older get two doses of the shingles vaccine called Shingrix (recombinant zoster vaccine), separated by 2 to 6 months, to prevent shingles and the complications from the disease. Your doctor or pharmacist can give you Shingrix as a shot in your upper arm. Shingrix provides strong protection against shingles and PHN. Two doses of Shingrix is more than 90% effective at preventing shingles and PHN. Protection stays above 85% for at least the first four years after you get vaccinated. Shingrix is the preferred vaccine, over Zostavax (zoster vaccine live), a shingles vaccine in use since 2006. Zostavax may still be used to prevent shingles in healthy adults 60 years and older. For example, you could use Zostavax if a person is allergic to Shingrix, prefers Zostavax, or requests immediate vaccination and Shingrix is unavailable. Who Should Get Shingrix? Healthy adults 50 years and older should get two doses of Shingrix, separated by 2 to 6 months. You should get Shingrix even if in the past you . had shingles  . received Zostavax  . are not sure if you had chickenpox There is no maximum age for getting Shingrix. If you had shingles in the past, you can get Shingrix to help prevent future occurrences of the disease. There is no specific  length of time that you need to wait after having shingles before you can receive Shingrix, but generally you should make sure the shingles rash has gone away before getting vaccinated. You can get Shingrix whether or not you remember having had chickenpox in the past. Studies show that more than 99% of Americans 40 years and older have had chickenpox, even if they don't remember having the disease. Chickenpox and shingles are related because they are caused by the same virus (varicella zoster virus). After a person recovers from chickenpox, the virus stays dormant (inactive) in the body. It can reactivate years later and cause shingles. If you had Zostavax in the recent past, you should wait at least eight weeks before getting Shingrix. Talk to your healthcare provider to determine the best time to get Shingrix. Shingrix is available in [employer] and pharmacies. To find doctor's offices or pharmacies near you that offer the vaccine, visit [employer] Vaccine FinderExternal. If you have questions about Shingrix, talk with your healthcare provider. Vaccine for Those 50  Years and Older  Shingrix reduces the risk of shingles and PHN by more than 90% in people 14 and older. CDC recommends the vaccine for healthy adults 70 and older.  Who Should Not Get Shingrix? You should not get Shingrix if you: . have ever had a severe allergic reaction to any component of the vaccine or after a dose of Shingrix  . tested negative for immunity to varicella zoster virus. If you test negative, you should get chickenpox vaccine.  . currently have shingles  . currently are pregnant or breastfeeding. Women who are pregnant or breastfeeding should wait to get Shingrix.  Marland Kitchen receive specific antiviral drugs (acyclovir, famciclovir, or valacyclovir) 24 hours before vaccination (avoid use of these antiviral drugs for 14 days after vaccination)- zoster vaccine live only If you have a minor acute (starts suddenly) illness,  such as a cold, you may get Shingrix. But if you have a moderate or severe acute illness, you should usually wait until you recover before getting the vaccine. This includes anyone with a temperature of 101.20F or higher. The side effects of the Shingrix are temporary, and usually last 2 to 3 days. While you may experience pain for a few days after getting Shingrix, the pain will be less severe than having shingles and the complications from the disease. How Well Does Shingrix Work? Two doses of Shingrix provides strong protection against shingles and postherpetic neuralgia (PHN), the most common complication of shingles. . In adults 9 to 78 years old who got two doses, Shingrix was 97% effective in preventing shingles; among adults 70 years and older, Shingrix was 91% effective.  . In adults 49 to 78 years old who got two doses, Shingrix was 91% effective in preventing PHN; among adults 70 years and older, Shingrix was 89% effective. Shingrix protection remained high (more than 85%) in people 70 years and older throughout the four years following vaccination. Since your risk of shingles and PHN increases as you get older, it is important to have strong protection against shingles in your older years. Top of Page  What Are the Possible Side Effects of Shingrix? Studies show that Shingrix is safe. The vaccine helps your body create a strong defense against shingles. As a result, you are likely to have temporary side effects from getting the shots. The side effects may affect your ability to do normal daily activities for 2 to 3 days. Most people got a sore arm with mild or moderate pain after getting Shingrix, and some also had redness and swelling where they got the shot. Some people felt tired, had muscle pain, a headache, shivering, fever, stomach pain, or nausea. About 1 out of 6 people who got Shingrix experienced side effects that prevented them from doing regular activities. Symptoms went away on  their own in about 2 to 3 days. Side effects were more common in younger people. You might have a reaction to the first or second dose of Shingrix, or both doses. If you experience side effects, you may choose to take over-the-counter pain medicine such as ibuprofen or acetaminophen. If you experience side effects from Shingrix, you should report them to the Vaccine Adverse Event Reporting System (VAERS). Your doctor might file this report, or you can do it yourself through the VAERS websiteExternal, or by calling [phone]. If you have any questions about side effects from Shingrix, talk with your doctor. The shingles vaccine does not contain thimerosal (a preservative containing mercury). Top of Page  When Should I  See a Doctor Because of the Side Effects I Experience From Shingrix? In clinical trials, Shingrix was not associated with serious adverse events. In fact, serious side effects from vaccines are extremely rare. For example, for every 1 million doses of a vaccine given, only one or two people may have a severe allergic reaction. Signs of an allergic reaction happen within minutes or hours after vaccination and include hives, swelling of the face and throat, difficulty breathing, a fast heartbeat, dizziness, or weakness. If you experience these or any other life-threatening symptoms, see a doctor right away. Shingrix causes a strong response in your immune system, so it may produce short-term side effects more intense than you are used to from other vaccines. These side effects can be uncomfortable, but they are expected and usually go away on their own in 2 or 3 days. Top of Page  How Can I Pay For Shingrix? There are several ways shingles vaccine may be paid for: Medicare . Medicare Part D plans cover the shingles vaccine, but there may be a cost to you depending on your plan. There may be a copay for the vaccine, or you may need to pay in full then get reimbursed for a certain amount.   . Medicare Part B does not cover the shingles vaccine. Medicaid . Medicaid may or may not cover the vaccine. Contact your insurer to find out. Private health insurance . Many private health insurance plans will cover the vaccine, but there may be a cost to you depending on your plan. Contact your insurer to find out. Vaccine assistance programs . Some pharmaceutical companies provide vaccines to eligible adults who cannot afford them. You may want to check with the vaccine manufacturer, [employer], about Shingrix. If you do not currently have health insurance, learn more about affordable health coverage optionsExternal. To find doctor's offices or pharmacies near you that offer the vaccine, visit HealthMap Vaccine FinderExternal.

## 2019-08-15 LAB — FERRITIN: Ferritin: 241 ng/mL (ref 16–288)

## 2019-08-15 LAB — COMPLETE METABOLIC PANEL WITH GFR
AG Ratio: 1.7 (calc) (ref 1.0–2.5)
ALT: 27 U/L (ref 6–29)
AST: 22 U/L (ref 10–35)
Albumin: 4.5 g/dL (ref 3.6–5.1)
Alkaline phosphatase (APISO): 64 U/L (ref 37–153)
BUN: 12 mg/dL (ref 7–25)
CO2: 29 mmol/L (ref 20–32)
Calcium: 10.1 mg/dL (ref 8.6–10.4)
Chloride: 98 mmol/L (ref 98–110)
Creat: 0.81 mg/dL (ref 0.60–0.93)
GFR, Est African American: 81 mL/min/{1.73_m2} (ref >=60)
GFR, Est Non African American: 70 mL/min/{1.73_m2} (ref >=60)
Globulin: 2.6 g/dL (calc) (ref 1.9–3.7)
Glucose, Bld: 106 mg/dL — ABNORMAL HIGH (ref 65–99)
Potassium: 4.3 mmol/L (ref 3.5–5.3)
Sodium: 135 mmol/L (ref 135–146)
Total Bilirubin: 0.8 mg/dL (ref 0.2–1.2)
Total Protein: 7.1 g/dL (ref 6.1–8.1)

## 2019-08-15 LAB — CBC WITH DIFFERENTIAL/PLATELET
Absolute Monocytes: 652 cells/uL (ref 200–950)
Basophils Absolute: 32 cells/uL (ref 0–200)
Basophils Relative: 0.6 %
Eosinophils Absolute: 138 cells/uL (ref 15–500)
Eosinophils Relative: 2.6 %
HCT: 39.5 % (ref 35.0–45.0)
Hemoglobin: 13 g/dL (ref 11.7–15.5)
Lymphs Abs: 2147 cells/uL (ref 850–3900)
MCH: 30.2 pg (ref 27.0–33.0)
MCHC: 32.9 g/dL (ref 32.0–36.0)
MCV: 91.9 fL (ref 80.0–100.0)
MPV: 9.2 fL (ref 7.5–12.5)
Monocytes Relative: 12.3 %
Neutro Abs: 2332 cells/uL (ref 1500–7800)
Neutrophils Relative %: 44 %
Platelets: 307 10*3/uL (ref 140–400)
RBC: 4.3 10*6/uL (ref 3.80–5.10)
RDW: 13 % (ref 11.0–15.0)
Total Lymphocyte: 40.5 %
WBC: 5.3 10*3/uL (ref 3.8–10.8)

## 2019-08-15 LAB — IRON, TOTAL/TOTAL IRON BINDING CAP
%SAT: 39 % (calc) (ref 16–45)
Iron: 137 ug/dL (ref 45–160)
TIBC: 355 mcg/dL (calc) (ref 250–450)

## 2019-08-15 LAB — MAGNESIUM: Magnesium: 1.9 mg/dL (ref 1.5–2.5)

## 2019-08-15 LAB — LIPID PANEL
Cholesterol: 222 mg/dL — ABNORMAL HIGH (ref <200)
HDL: 74 mg/dL (ref >=50)
LDL Cholesterol (Calc): 121 mg/dL (calc) — ABNORMAL HIGH
Non-HDL Cholesterol (Calc): 148 mg/dL (calc) — ABNORMAL HIGH (ref <130)
Total CHOL/HDL Ratio: 3 (calc) (ref <5.0)
Triglycerides: 154 mg/dL — ABNORMAL HIGH (ref <150)

## 2019-08-15 LAB — VITAMIN B12: Vitamin B-12: 372 pg/mL (ref 200–1100)

## 2019-08-15 LAB — TSH: TSH: 1.59 mIU/L (ref 0.40–4.50)

## 2019-08-16 ENCOUNTER — Other Ambulatory Visit: Payer: Self-pay | Admitting: Physician Assistant

## 2019-08-16 DIAGNOSIS — Z1231 Encounter for screening mammogram for malignant neoplasm of breast: Secondary | ICD-10-CM

## 2019-09-09 DIAGNOSIS — M1711 Unilateral primary osteoarthritis, right knee: Secondary | ICD-10-CM | POA: Diagnosis not present

## 2019-09-09 DIAGNOSIS — M25561 Pain in right knee: Secondary | ICD-10-CM | POA: Diagnosis not present

## 2019-09-16 DIAGNOSIS — M1711 Unilateral primary osteoarthritis, right knee: Secondary | ICD-10-CM | POA: Diagnosis not present

## 2019-09-23 DIAGNOSIS — M1711 Unilateral primary osteoarthritis, right knee: Secondary | ICD-10-CM | POA: Diagnosis not present

## 2019-10-28 ENCOUNTER — Other Ambulatory Visit: Payer: Self-pay | Admitting: Internal Medicine

## 2019-10-28 DIAGNOSIS — I10 Essential (primary) hypertension: Secondary | ICD-10-CM

## 2019-12-23 ENCOUNTER — Other Ambulatory Visit: Payer: Self-pay | Admitting: Physician Assistant

## 2019-12-23 DIAGNOSIS — J302 Other seasonal allergic rhinitis: Secondary | ICD-10-CM

## 2020-01-14 ENCOUNTER — Other Ambulatory Visit: Payer: Self-pay | Admitting: Internal Medicine

## 2020-01-15 ENCOUNTER — Ambulatory Visit (INDEPENDENT_AMBULATORY_CARE_PROVIDER_SITE_OTHER): Payer: PPO | Admitting: Adult Health

## 2020-01-15 ENCOUNTER — Other Ambulatory Visit: Payer: Self-pay

## 2020-01-15 ENCOUNTER — Ambulatory Visit
Admission: RE | Admit: 2020-01-15 | Discharge: 2020-01-15 | Disposition: A | Discharge status: Home / Self Care | Payer: PPO | Source: Ambulatory Visit | Attending: Adult Health | Admitting: Adult Health

## 2020-01-15 ENCOUNTER — Other Ambulatory Visit: Payer: Self-pay | Admitting: Adult Health

## 2020-01-15 ENCOUNTER — Encounter: Payer: Self-pay | Admitting: Adult Health

## 2020-01-15 VITALS — BP 106/70 | HR 53 | Temp 96.8°F | Wt 144.0 lb

## 2020-01-15 DIAGNOSIS — K5790 Diverticulosis of intestine, part unspecified, without perforation or abscess without bleeding: Secondary | ICD-10-CM

## 2020-01-15 DIAGNOSIS — R1030 Lower abdominal pain, unspecified: Secondary | ICD-10-CM | POA: Diagnosis not present

## 2020-01-15 DIAGNOSIS — K5792 Diverticulitis of intestine, part unspecified, without perforation or abscess without bleeding: Secondary | ICD-10-CM | POA: Diagnosis not present

## 2020-01-15 DIAGNOSIS — R10819 Abdominal tenderness, unspecified site: Secondary | ICD-10-CM | POA: Diagnosis not present

## 2020-01-15 DIAGNOSIS — K5732 Diverticulitis of large intestine without perforation or abscess without bleeding: Secondary | ICD-10-CM | POA: Diagnosis not present

## 2020-01-15 DIAGNOSIS — N9489 Other specified conditions associated with female genital organs and menstrual cycle: Secondary | ICD-10-CM | POA: Insufficient documentation

## 2020-01-15 LAB — URINALYSIS W MICROSCOPIC + REFLEX CULTURE
Bacteria, UA: NONE SEEN /HPF
Bilirubin Urine: NEGATIVE
Glucose, UA: NEGATIVE
Hyaline Cast: NONE SEEN /LPF
Ketones, ur: NEGATIVE
Leukocyte Esterase: NEGATIVE
Nitrites, Initial: NEGATIVE
Protein, ur: NEGATIVE
RBC / HPF: NONE SEEN /HPF (ref 0–2)
Specific Gravity, Urine: 1.01 (ref 1.001–1.03)
Squamous Epithelial / HPF: NONE SEEN /HPF (ref <=5)
WBC, UA: NONE SEEN /HPF (ref 0–5)
pH: 6.5 (ref 5.0–8.0)

## 2020-01-15 LAB — CBC WITH DIFFERENTIAL/PLATELET
Absolute Monocytes: 1406 cells/uL — ABNORMAL HIGH (ref 200–950)
Basophils Absolute: 39 cells/uL (ref 0–200)
Basophils Relative: 0.3 %
Eosinophils Absolute: 26 cells/uL (ref 15–500)
Eosinophils Relative: 0.2 %
HCT: 38.7 % (ref 35.0–45.0)
Hemoglobin: 13.1 g/dL (ref 11.7–15.5)
Lymphs Abs: 1767 cells/uL (ref 850–3900)
MCH: 31.4 pg (ref 27.0–33.0)
MCHC: 33.9 g/dL (ref 32.0–36.0)
MCV: 92.8 fL (ref 80.0–100.0)
MPV: 9.3 fL (ref 7.5–12.5)
Monocytes Relative: 10.9 %
Neutro Abs: 9662 cells/uL — ABNORMAL HIGH (ref 1500–7800)
Neutrophils Relative %: 74.9 %
Platelets: 323 10*3/uL (ref 140–400)
RBC: 4.17 10*6/uL (ref 3.80–5.10)
RDW: 12.4 % (ref 11.0–15.0)
Total Lymphocyte: 13.7 %
WBC: 12.9 10*3/uL — ABNORMAL HIGH (ref 3.8–10.8)

## 2020-01-15 LAB — COMPLETE METABOLIC PANEL WITH GFR
AG Ratio: 1.6 (calc) (ref 1.0–2.5)
ALT: 23 U/L (ref 6–29)
AST: 18 U/L (ref 10–35)
Albumin: 4.4 g/dL (ref 3.6–5.1)
Alkaline phosphatase (APISO): 65 U/L (ref 37–153)
BUN: 13 mg/dL (ref 7–25)
CO2: 26 mmol/L (ref 20–32)
Calcium: 9.4 mg/dL (ref 8.6–10.4)
Chloride: 95 mmol/L — ABNORMAL LOW (ref 98–110)
Creat: 0.93 mg/dL (ref 0.60–0.93)
GFR, Est African American: 68 mL/min/{1.73_m2} (ref >=60)
GFR, Est Non African American: 59 mL/min/{1.73_m2} — ABNORMAL LOW (ref >=60)
Globulin: 2.8 g/dL (calc) (ref 1.9–3.7)
Glucose, Bld: 110 mg/dL — ABNORMAL HIGH (ref 65–99)
Potassium: 4.3 mmol/L (ref 3.5–5.3)
Sodium: 130 mmol/L — ABNORMAL LOW (ref 135–146)
Total Bilirubin: 1.2 mg/dL (ref 0.2–1.2)
Total Protein: 7.2 g/dL (ref 6.1–8.1)

## 2020-01-15 LAB — NO CULTURE INDICATED

## 2020-01-15 MED ORDER — CIPROFLOXACIN HCL 500 MG PO TABS: 500.0000 mg | ORAL_TABLET | Freq: Two times a day (BID) | ORAL | 0 refills | Status: AC

## 2020-01-15 MED ORDER — METRONIDAZOLE 500 MG PO TABS: 500.0000 mg | ORAL_TABLET | Freq: Three times a day (TID) | ORAL | 0 refills | Status: AC

## 2020-01-15 MED ORDER — IOPAMIDOL (ISOVUE-300) INJECTION 61%
100.0000 mL | Freq: Once | INTRAVENOUS | Status: AC | PRN
  Administered 2020-01-15: 100 mL via INTRAVENOUS

## 2020-01-15 NOTE — Progress Notes (Addendum)
Assessment and Plan:  Michaela Kelley was seen today for abdominal pain.  Diagnoses and all orders for this visit:  Lower abdominal pain Lower abdominal tenderness without rebound Diverticulosis 3 days of abdominal pain and lack of BM, concerning for diverticulitis, some peritoneal signs concerning for possible abscess, will get STAT labs and CT to r/o surgical emergency, perforation, obstruction, but does appear stable at this time Neg Mcburney's; Check labs; urine to r/o UTI; denies concerning GYN sx  anticipate starting cipro/flagyl, discussed with patient but will hold off pending CT results If any significant worsening advised ED Stick to clear liquids or NPO pending CT results this afternoon  -     CBC with Differential/Platelet -     COMPLETE METABOLIC PANEL WITH GFR -     Urinalysis w microscopic + reflex cultur -     CT Abdomen Pelvis W Contrast; Future  Addendum CT confirmed diverticulitis; cipro/flagyl sent in as discussed. Follow up if not improving, continue ED precautions if any worsening sx.  Further disposition pending results of labs. Discussed med's effects and SE's.   Over 30 minutes of exam, counseling, chart review, and critical decision making was performed.   Future Appointments  Date Time Provider [place]  02/13/2020 10:00 AM Liane Comber, NP [facility] None  02/24/2020  2:00 PM [facility] MM 2 [facility]  02/24/2020  2:30 PM [facility] DX DEXA 1 [facility]  08/19/2020 10:00 AM McClanahan, Kyra, NP [facility] None    ------------------------------------------------------------------------------------------------------------------   HPI BP 106/70   Pulse (!) 53   Temp (!) 96.8 F (36 C)   Wt 144 lb (65.3 kg)   SpO2 95%   BMI 27.21 kg/m   79 y.o.female presents for evaluation of abdominal pain x 3 days.   She reports woke up 3 days ago on Sunday with generalized lower abdominal pain, no atypical foods recently, reports had watery  diarrhea x 5 episodes (denies blood, black, mucus, fatty), then diarrhea stopped but has had persistent severe generalized lower abdominal pain, less when still but severe with any movement, dull, "like a really bad gas pain," reports hasn't had a BM since, but is producing and passing some gas. Denies belching, bloating, nausea. Admits poor appetite, not eating much. Cheese sandwhich last night. Feels very tired. Hasn't tried anything for pain other than rest.   She denies fever/chills. Denies urinary sx, dysuria, urgency, frequency. She denies vaginal discharge, not sexually active. No hx of STIs.   She denies abx, last in 2016.   Had colonoscopy 08/2016 by Dr. Hilarie Fredrickson, showed polyp and Multiple small and large-mouthed diverticula were found in the recto-sigmoid colon, sigmoid colon, hepatic flexure and ascending colon. There was narrowing of the colon in association with the diverticular opening.  She denies knowledge of diverticulosis or hx of diverticulitis. Reports takes magneisum supplement and typically has good BMs.   Past Medical History:  Diagnosis Date  . Allergy   . Arthritis   . Cataract   . GERD (gastroesophageal reflux disease)   . Hx: UTI (urinary tract infection)   . Hyperlipidemia   . Hypertension   . Prediabetes   . Seasonal allergic rhinitis   . Vitamin D deficiency      Allergies  Allergen Reactions  . Codeine Nausea And Vomiting and Other (See Comments)    Severe headaches (tolerates Dilaudid)  . Hydrocodone-Acetaminophen Nausea And Vomiting and Other (See Comments)    Severe headaches (tolerates Dilaudid)  . Oxycodone-Acetaminophen Nausea And Vomiting and Other (  See Comments)    Severe headaches (tolerates Dilaudid)  . Oxycodone-Aspirin Nausea And Vomiting and Other (See Comments)    Severe headaches (tolerates Dilaudid)    Current Outpatient Medications on File Prior to Visit  Medication Sig  . aspirin EC 81 MG tablet Take 81 mg by mouth daily.    Marland Kitchen  atenolol (TENORMIN) 50 MG tablet TAKE ONE TABLET DAILY BLOOD PRESSURE  . b complex vitamins capsule Take 1 capsule by mouth daily.  . Celecoxib (CELEBREX PO) Take 200 mg by mouth.  . Cholecalciferol (VITAMIN D PO) Take 4,000 Int'l Units by mouth daily.   . furosemide (LASIX) 40 MG tablet Take 1 tablet Daily for BP & Fluid Retention / Ankle Swelling (Patient taking differently: Take 1/2 tablet Daily for BP & Fluid Retention / Ankle Swelling)  . MAGNESIUM PO Take 150 mg by mouth daily.   . montelukast (SINGULAIR) 10 MG tablet TAKE ONE TABLET DAILY FOR ALLERGY  . PROAIR HFA 108 (90 Base) MCG/ACT inhaler INHALE 2 PUFFS INTO THE LUNGS EVERY 6 HOURS AS NEEDED FOR WHEEZING OR SHORTNESS OF BREATH  . TURMERIC PO Take 800 mg by mouth 2 (two) times daily.   . Omega-3 Fatty Acids (FISH OIL) 1000 MG CAPS Take by mouth daily. (Patient not taking: Reported on 01/15/2020)   Current Facility-Administered Medications on File Prior to Visit  Medication  . triamcinolone acetonide (KENALOG) 10 MG/ML injection 10 mg    ROS: all negative except above.   Physical Exam:  BP 106/70   Pulse (!) 53   Temp (!) 96.8 F (36 C)   Wt 144 lb (65.3 kg)   SpO2 95%   BMI 27.21 kg/m   General Appearance: Well nourished, well dressed, in no acute distress, appears to get in pain with movement.  Eyes: PERRLA, conjunctiva no swelling or erythema ENT/Mouth: Ext aud canals clear, TMs without erythema, bulging. No erythema, swelling, or exudate on post pharynx.  Tonsils not swollen or erythematous. Hearing normal.  Neck: Supple Respiratory: Respiratory effort normal, BS equal bilaterally without rales, rhonchi, wheezing or stridor.  Cardio: RRR with no MRGs. Brisk peripheral pulses without edema.  Abdomen: Soft, + BS.  Generalized lower abdominal tenderness, worst suprapubic, LLQ, with guarding but no rebound, no distinct hernias, masses. Generalized abdominal pain with heel drop not localized to RLQ.  Lymphatics: Non  tender without lymphadenopathy.  Musculoskeletal: no obvious deformity, normal gait.  Skin: Warm, dry without rashes, lesions, ecchymosis.  Neuro: Normal muscle tone Psych: Awake and oriented X 3, normal affect, Insight and Judgment appropriate.     Izora Ribas, NP 11:30 AM [facility]

## 2020-01-15 NOTE — Patient Instructions (Signed)
Diverticulitis  Diverticulitis is infection or inflammation of small pouches (diverticula) in the colon that form due to a condition called diverticulosis. Diverticula can trap stool (feces) and bacteria, causing infection and inflammation. Diverticulitis may cause severe stomach pain and diarrhea. It may lead to tissue damage in the colon that causes bleeding. The diverticula may also burst (rupture) and cause infected stool to enter other areas of the abdomen. Complications of diverticulitis can include:  Bleeding.  Severe infection.  Severe pain.  Rupture (perforation) of the colon.  Blockage (obstruction) of the colon. What are the causes? This condition is caused by stool becoming trapped in the diverticula, which allows bacteria to grow in the diverticula. This leads to inflammation and infection. What increases the risk? You are more likely to develop this condition if:  You have diverticulosis. The risk for diverticulosis increases if: ? You are overweight or obese. ? You use tobacco products. ? You do not get enough exercise.  You eat a diet that does not include enough fiber. High-fiber foods include fruits, vegetables, beans, nuts, and whole grains. What are the signs or symptoms? Symptoms of this condition may include:  Pain and tenderness in the abdomen. The pain is normally located on the left side of the abdomen, but it may occur in other areas.  Fever and chills.  Bloating.  Cramping.  Nausea.  Vomiting.  Changes in bowel routines.  Blood in your stool. How is this diagnosed? This condition is diagnosed based on:  Your medical history.  A physical exam.  Tests to make sure there is nothing else causing your condition. These tests may include: ? Blood tests. ? Urine tests. ? Imaging tests of the abdomen, including X-rays, ultrasounds, MRIs, or CT scans. How is this treated? Most cases of this condition are mild and can be treated at home.  Treatment may include:  Taking over-the-counter pain medicines.  Following a clear liquid diet.  Taking antibiotic medicines by mouth.  Rest. More severe cases may need to be treated at a hospital. Treatment may include:  Not eating or drinking.  Taking prescription pain medicine.  Receiving antibiotic medicines through an IV tube.  Receiving fluids and nutrition through an IV tube.  Surgery. When your condition is under control, your health care provider may recommend that you have a colonoscopy. This is an exam to look at the entire large intestine. During the exam, a lubricated, bendable tube is inserted into the anus and then passed into the rectum, colon, and other parts of the large intestine. A colonoscopy can show how severe your diverticula are and whether something else may be causing your symptoms. Follow these instructions at home: Medicines  Take over-the-counter and prescription medicines only as told by your health care provider. These include fiber supplements, probiotics, and stool softeners.  If you were prescribed an antibiotic medicine, take it as told by your health care provider. Do not stop taking the antibiotic even if you start to feel better.  Do not drive or use heavy machinery while taking prescription pain medicine. General instructions   Follow a full liquid diet or another diet as directed by your health care provider. After your symptoms improve, your health care provider may tell you to change your diet. He or she may recommend that you eat a diet that contains at least 25 g (25 grams) of fiber daily. Fiber makes it easier to pass stool. Healthy sources of fiber include: ? Berries. One cup contains 4-8 grams of   fiber. ? Beans or lentils. One half cup contains 5-8 grams of fiber. ? Green vegetables. One cup contains 4 grams of fiber.  Exercise for at least 30 minutes, 3 times each week. You should exercise hard enough to raise your heart rate and  break a sweat.  Keep all follow-up visits as told by your health care provider. This is important. You may need a colonoscopy. Contact a health care provider if:  Your pain does not improve.  You have a hard time drinking or eating food.  Your bowel movements do not return to normal. Get help right away if:  Your pain gets worse.  Your symptoms do not get better with treatment.  Your symptoms suddenly get worse.  You have a fever.  You vomit more than one time.  You have stools that are bloody, black, or tarry. Summary  Diverticulitis is infection or inflammation of small pouches (diverticula) in the colon that form due to a condition called diverticulosis. Diverticula can trap stool (feces) and bacteria, causing infection and inflammation.  You are at higher risk for this condition if you have diverticulosis and you eat a diet that does not include enough fiber.  Most cases of this condition are mild and can be treated at home. More severe cases may need to be treated at a hospital.  When your condition is under control, your health care provider may recommend that you have an exam called a colonoscopy. This exam can show how severe your diverticula are and whether something else may be causing your symptoms. This information is not intended to replace advice given to you by your health care provider. Make sure you discuss any questions you have with your health care provider. Document Revised: 02/24/2017 Document Reviewed: 04/16/2016 Elsevier Patient Education  [place].     Metronidazole tablets or capsules What is this medicine? METRONIDAZOLE (me troe NI da zole) is an antiinfective. It is used to treat certain kinds of bacterial and protozoal infections. It will not work for colds, flu, or other viral infections. This medicine may be used for other purposes; ask your health care provider or pharmacist if you have questions. COMMON BRAND NAME(S): Flagyl What  should I tell my health care provider before I take this medicine? They need to know if you have any of these conditions: Cockayne syndrome history of blood diseases, like sickle cell anemia or leukemia history of yeast infection if you often drink alcohol liver disease an unusual or allergic reaction to metronidazole, nitroimidazoles, or other medicines, foods, dyes, or preservatives pregnant or trying to get pregnant breast-feeding How should I use this medicine? Take this medicine by mouth with a full glass of water. Follow the directions on the prescription label. Take your medicine at regular intervals. Do not take your medicine more often than directed. Take all of your medicine as directed even if you think you are better. Do not skip doses or stop your medicine early. Talk to your pediatrician regarding the use of this medicine in children. Special care may be needed. Overdosage: If you think you have taken too much of this medicine contact a poison control center or emergency room at once. NOTE: This medicine is only for you. Do not share this medicine with others. What if I miss a dose? If you miss a dose, take it as soon as you can. If it is almost time for your next dose, take only that dose. Do not take double or extra doses.  What may interact with this medicine? Do not take this medicine with any of the following medications: alcohol or any product that contains alcohol cisapride disulfiram dronedarone pimozide thioridazine This medicine may also interact with the following medications: amiodarone birth control pills busulfan carbamazepine cimetidine cyclosporine fluorouracil lithium other medicines that prolong the QT interval (cause an abnormal heart rhythm) like dofetilide, ziprasidone phenobarbital phenytoin quinidine tacrolimus vecuronium warfarin This list may not describe all possible interactions. Give your health care provider a list of all the  medicines, herbs, non-prescription drugs, or dietary supplements you use. Also tell them if you smoke, drink alcohol, or use illegal drugs. Some items may interact with your medicine. What should I watch for while using this medicine? Tell your doctor or health care professional if your symptoms do not improve or if they get worse. You may get drowsy or dizzy. Do not drive, use machinery, or do anything that needs mental alertness until you know how this medicine affects you. Do not stand or sit up quickly, especially if you are an older patient. This reduces the risk of dizzy or fainting spells. Ask your doctor or health care professional if you should avoid alcohol. Many nonprescription cough and cold products contain alcohol. Metronidazole can cause an unpleasant reaction when taken with alcohol. The reaction includes flushing, headache, nausea, vomiting, sweating, and increased thirst. The reaction can last from 30 minutes to several hours. If you are being treated for a sexually transmitted disease, avoid sexual contact until you have finished your treatment. Your sexual partner may also need treatment. What side effects may I notice from receiving this medicine? Side effects that you should report to your doctor or health care professional as soon as possible: allergic reactions like skin rash or hives, swelling of the face, lips, or tongue confusion fast, irregular heartbeat fever, chills, sore throat fever with rash, swollen lymph nodes, or swelling of the face pain, tingling, numbness in the hands or feet redness, blistering, peeling or loosening of the skin, including inside the mouth seizures sign and symptoms of liver injury like dark yellow or brown urine; general ill feeling or flu-like symptoms; light colored stools; loss of appetite; nausea; right upper belly pain; unusually weak or tired; yellowing of the eyes or skin vaginal discharge, itching, or odor in women Side effects that  usually do not require medical attention (report to your doctor or health care professional if they continue or are bothersome): changes in taste diarrhea headache nausea, vomiting stomach pain This list may not describe all possible side effects. Call your doctor for medical advice about side effects. You may report side effects to FDA at 1-800-FDA-1088. Where should I keep my medicine? Keep out of the reach of children. Store at room temperature below 25 degrees C (77 degrees F). Protect from light. Keep container tightly closed. Throw away any unused medicine after the expiration date. NOTE: This sheet is a summary. It may not cover all possible information. If you have questions about this medicine, talk to your doctor, pharmacist, or health care provider.  2020 Elsevier/Gold Standard (2018-03-06 06:52:33) Ciprofloxacin tablets What is this medicine? CIPROFLOXACIN (sip roe FLOX a sin) is a quinolone antibiotic. It is used to treat certain kinds of bacterial infections. It will not work for colds, flu, or other viral infections. This medicine may be used for other purposes; ask your health care provider or pharmacist if you have questions. COMMON BRAND NAME(S): Cipro What should I tell my health care provider before  I take this medicine? They need to know if you have any of these conditions: bone problems diabetes heart disease high blood pressure history of irregular heartbeat history of low levels of potassium in the blood joint problems kidney disease liver disease mental illness myasthenia gravis seizures tendon problems tingling of the fingers or toes, or other nerve disorder an unusual or allergic reaction to ciprofloxacin, other antibiotics or medicines, foods, dyes, or preservatives pregnant or trying to get pregnant breast-feeding How should I use this medicine? Take this medicine by mouth with a full glass of water. Follow the directions on the prescription label.  You can take it with or without food. If it upsets your stomach, take it with food. Take your medicine at regular intervals. Do not take your medicine more often than directed. Take all of your medicine as directed even if you think you are better. Do not skip doses or stop your medicine early. Avoid antacids, aluminum, calcium, iron, magnesium, and zinc products for 6 hours before and 2 hours after taking a dose of this medicine. A special MedGuide will be given to you by the pharmacist with each prescription and refill. Be sure to read this information carefully each time. Talk to your pediatrician regarding the use of this medicine in children. Special care may be needed. Overdosage: If you think you have taken too much of this medicine contact a poison control center or emergency room at once. NOTE: This medicine is only for you. Do not share this medicine with others. What if I miss a dose? If you miss a dose, take it as soon as you can. If it is almost time for your next dose, take only that dose. Do not take double or extra doses. What may interact with this medicine? Do not take this medicine with any of the following medications: cisapride dronedarone flibanserin lomitapide pimozide thioridazine tizanidine This medicine may also interact with the following medications: antacids birth control pills caffeine certain medicines for diabetes, like glipizide, glyburide, or insulin certain medicines that treat or prevent blood clots like warfarin clozapine cyclosporine didanosine buffered tablets or powder dofetilide duloxetine lanthanum carbonate lidocaine methotrexate multivitamins NSAIDS, medicines for pain and inflammation, like ibuprofen or naproxen olanzapine omeprazole other medicines that prolong the QT interval (cause an abnormal heart rhythm) phenytoin probenecid ropinirole sevelamer sildenafil sucralfate theophylline ziprasidone zolpidem This list may not  describe all possible interactions. Give your health care provider a list of all the medicines, herbs, non-prescription drugs, or dietary supplements you use. Also tell them if you smoke, drink alcohol, or use illegal drugs. Some items may interact with your medicine. What should I watch for while using this medicine? Tell your doctor or health care provider if your symptoms do not start to get better or if they get worse. This medicine may cause serious skin reactions. They can happen weeks to months after starting the medicine. Contact your health care provider right away if you notice fevers or flu-like symptoms with a rash. The rash may be red or purple and then turn into blisters or peeling of the skin. Or, you might notice a red rash with swelling of the face, lips or lymph nodes in your neck or under your arms. Do not treat diarrhea with over the counter products. Contact your doctor if you have diarrhea that lasts more than 2 days or if it is severe and watery. Check with your doctor or health care provider if you get an attack of severe diarrhea,  nausea and vomiting, or if you sweat a lot. The loss of too much body fluid can make it dangerous for you to take this medicine. This medicine may increase blood sugar. Ask your health care provider if changes in diet or medicines are needed if you have diabetes. You may get drowsy or dizzy. Do not drive, use machinery, or do anything that needs mental alertness until you know how this medicine affects you. Do not sit or stand up quickly, especially if you are an older patient. This reduces the risk of dizzy or fainting spells. This medicine can make you more sensitive to the sun. Keep out of the sun. If you cannot avoid being in the sun, wear protective clothing and use sunscreen. Do not use sun lamps or tanning beds/booths. What side effects may I notice from receiving this medicine? Side effects that you should report to your doctor or health care  professional as soon as possible: allergic reactions like skin rash or hives, swelling of the face, lips, or tongue anxious bloody or watery diarrhea confusion depressed mood fast, irregular heartbeat fever hallucination, loss of contact with reality joint, muscle, or tendon pain or swelling loss of memory pain, tingling, numbness in the hands or feet redness, blistering, peeling or loosening of the skin, including inside the mouth seizures signs and symptoms of aortic dissection such as sudden chest, stomach, or back pain signs and symptoms of high blood sugar such as being more thirsty or hungry or having to urinate more than normal. You may also feel very tired or have blurry vision. signs and symptoms of liver injury like dark yellow or brown urine; general ill feeling or flu-like symptoms; light-colored stools; loss of appetite; nausea; right upper belly pain; unusually weak or tired; yellowing of the eyes or skin signs and symptoms of low blood sugar such as feeling anxious; confusion; dizziness; increased hunger; unusually weak or tired; sweating; shakiness; cold; irritable; headache; blurred vision; fast heartbeat; loss of consciousness; pale skin suicidal thoughts or other mood changes sunburn unusually weak or tired Side effects that usually do not require medical attention (report to your doctor or health care professional if they continue or are bothersome): dry mouth headache nausea trouble sleeping This list may not describe all possible side effects. Call your doctor for medical advice about side effects. You may report side effects to FDA at 1-800-FDA-1088. Where should I keep my medicine? Keep out of the reach of children. Store at room temperature below 30 degrees C (86 degrees F). Keep container tightly closed. Throw away any unused medicine after the expiration date. NOTE: This sheet is a summary. It may not cover all possible information. If you have questions about  this medicine, talk to your doctor, pharmacist, or health care provider.  2020 Elsevier/Gold Standard (2018-06-14 11:26:08)

## 2020-01-16 ENCOUNTER — Telehealth: Payer: Self-pay

## 2020-01-16 NOTE — Telephone Encounter (Signed)
Patient called back and said nevermind that she figured it out.

## 2020-01-16 NOTE — Telephone Encounter (Signed)
Patient states that she hasn't had a bowel movement since Monday and is requesting something be sent into the pharmacy. Please advise.

## 2020-02-12 DIAGNOSIS — E538 Deficiency of other specified B group vitamins: Secondary | ICD-10-CM | POA: Insufficient documentation

## 2020-02-12 NOTE — Progress Notes (Signed)
CPE  Assessment:   Encounter for general adult medical examination with abnormal findings 1 year  Essential hypertension Atypically elevated today; she will monitor at home, start taking lasix 20 mg daily if consistently 130/80+, return for NV recheck in 2 weeks  - continue medications, DASH diet, exercise and monitor at home. Call if greater than 130/80.  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   Hyperlipidemia, unspecified hyperlipidemia type -     Lipid panel check lipids decrease fatty foods increase activity.   Medication management -     Magnesium  Vitamin D deficiency -     VITAMIN D 25 Hydroxy (Vit-D Deficiency, Fractures)  Seasonal allergic rhinitis, unspecified trigger Continue meds, singulaire and PRN zyrtec is working well   Pelvic congestion syndrome Incidentally noted on imaging; denies pelvic or vaginal sx; monitor   Osteoporosis without current pathological fracture, unspecified osteoporosis type In forearm only; hips T-2.0; continue calcium, vitamin D, weight bearing exercises Never started alendronate; after discussion will plan to initiate after next if no improvement Repeat DEXA - scheduled 02/24/2020  Actinic keratoses 3 freeze and thaw technique after verbal permission Tolerated well; monitor, follow up PRN  Needs flu shot -     Flu vaccine HIGH DOSE PF   Future Appointments  Date Time Provider [place]  02/24/2020  2:00 PM [facility] MM 2 [facility]  02/24/2020  2:30 PM [facility] DX DEXA 1 [facility]  08/19/2020 10:00 AM Garnet Sierras, NP [facility] None  02/15/2021  9:00 AM Liane Comber, NP [facility] None      Subjective:  Michaela Kelley is a 79  y.o. female who presents for CPE and follow up for cholesterol, allergies, and vitamin D.   She is widowed, 2 daughters, 1 grandchild - 29 year old.   She has no concerns.   She had abdominal pain, CT abd 01/15/2020 showed focal diverticulitis involving the distal sigmoid colon, she responded well to abx. Also incidentally noted enlarged left-sided periuterine veins are noted within large left ovarian vein, consistent with pelvic congestion syndrome, denies notable vaginal or pelvic sx.   She has orthotic for left foot pain, seeing Dr. Paulla Dolly, doing well recently.  Follows with [facility], Dr. Delilah Shan for knee gel injections.   Allergies are doing well at this time, mainly takes Singulair in the spring and fall.    BMI is Body mass index is 26.47 kg/m., she has been working on diet and exercise. She walks daily and golfs regularly, also doing light weights and resistance bands. Wt Readings from Last 3 Encounters:  02/13/20 142 lb 6.4 oz (64.6 kg)  01/15/20 144 lb (65.3 kg)  08/14/19 146 lb 3.2 oz (66.3 kg)   She has broken blood vessels bilateral legs, worse left leg, no pain, wearing compression. she is taking 20 mg lasix daily, no swelling, atenolol 50 mg daily   Her blood pressure has been controlled at home, today their BP is BP: 130/72. She does workout, walking, stretching and golfing.  She denies chest pain, shortness of breath, dizziness.  She is not on cholesterol medication, on omega 3 supplement, she is off lipitor due to  myalgias and mild/mod elevations not aggressively pursued secondary to benign history. Her cholesterol is not at goal. The cholesterol last visit was:   Lab Results  Component Value Date   CHOL 222 (H) 08/14/2019   HDL 74 08/14/2019   LDLCALC 121 (H) 08/14/2019   TRIG 154 (H) 08/14/2019   CHOLHDL 3.0 08/14/2019    She has been working on diet and exercise for glucose management, and denies paresthesia of the feet, polydipsia, polyuria and visual  disturbances. Last A1C in the office was:  Lab Results  Component Value Date   HGBA1C 5.3 02/12/2019    She has stable CKD II monitored at this office:  Lab Results  Component Value Date   GFRNONAA 69 (L) 01/15/2020   Patient is on Vitamin D supplement taking 4000 IU daily    Lab Results  Component Value Date   VD25OH 61 02/12/2019   She did not start sublingual B12 has taken B complex for years, agreeable to transitioning.  Lab Results  Component Value Date   [id number] 355 08/14/2019     Medication Review: Current Outpatient Medications on File Prior to Visit  Medication Sig Dispense Refill  . aspirin EC 81 MG tablet Take 81 mg by mouth daily.      Marland Kitchen atenolol (TENORMIN) 50 MG tablet TAKE ONE TABLET DAILY BLOOD PRESSURE 90 tablet 3  . b complex vitamins capsule Take 1 capsule by mouth daily.    . Celecoxib (CELEBREX PO) Take 200 mg by mouth as needed.     . Cholecalciferol (VITAMIN D PO) Take 4,000 Int'l Units by mouth daily.     . furosemide (LASIX) 40 MG tablet Take 1 tablet Daily for BP & Fluid Retention / Ankle Swelling (Patient taking differently: Take 1/2 tablet Daily for BP & Fluid Retention / Ankle Swelling) 90 tablet 3  . MAGNESIUM PO Take 150 mg by mouth daily.     . montelukast (SINGULAIR) 10 MG tablet TAKE ONE TABLET DAILY FOR ALLERGY 90 tablet 3  . Omega-3 Fatty Acids (FISH OIL) 1000 MG CAPS Take by mouth daily.     Marland Kitchen PROAIR HFA 108 (90 Base) MCG/ACT inhaler INHALE 2 PUFFS INTO THE LUNGS EVERY 6 HOURS AS NEEDED FOR WHEEZING OR SHORTNESS OF BREATH 8.5 g 0  . TURMERIC PO Take 800 mg by mouth 2 (two) times daily.      Current Facility-Administered Medications on File Prior to Visit  Medication Dose Route Frequency Provider Last Rate Last Admin  . triamcinolone acetonide (KENALOG) 10 MG/ML injection 10 mg  10 mg Other Once Wallene Huh, DPM        Current Problems (verified) Patient Active Problem List   Diagnosis Date Noted  . B12 deficiency 02/12/2020  .  Diverticulosis 01/15/2020  . Pelvic congestion syndrome 01/15/2020  . Osteoarthritis of right knee 07/03/2019  . Osteoporosis 02/01/2018  . Tortuous aorta ([place]) 01/24/2017  . Medication management 10/03/2013  . Seasonal allergic rhinitis   . Vitamin D deficiency   . Hyperlipidemia 01/02/2008  . Essential hypertension 01/02/2008    Screening Tests Immunization History  Administered Date(s) Administered  . Influenza, High Dose Seasonal PF 03/10/2015, 01/10/2017, 02/01/2018, 02/12/2019  . PFIZER SARS-COV-2 Vaccination 04/16/2019, 05/07/2019  . Pneumococcal Conjugate-13 05/05/2014  . Pneumococcal Polysaccharide-23 06/28/2006  . Td 05/27/2011   Preventative care: Tetanus: 2013 Pneumovax: 2008 Prevnar 13: 2016 Flu vaccine: 2020, TODAY  Shingrix: declines Covid 19: 2/2, 2021, pfizer  Pap: 2011 normal- declines another MGM: 01/2019, scheduled 02/24/2020  DEXA: Osteoporosis 06/2017 Young Adult T-Score:-2.8 forearm, hip -2.0 - never started alendronate, DEXA scheduled 02/24/2020 and restart if needed  Colonoscopy: 2018 (Dr. Sharlett Iles) - tubular adenoma x 1 - ? 5 year follow up if desired  CT chest 2010 CXR 2017  Names of Other Physician/Practitioners you currently use: 1. [facility] here for primary care 2. Dr. Celene Squibb, eye doctor, 07/2019 3. Dr. Edsel Petrin, dentist, last 11/2019, goes q6 4. Dr. Sharlett Iles, GI 5. [facility] 6. Dr. Marguerite Olea, derm, PRN  Patient Care Team: Unk Pinto, MD as PCP - General (Internal Medicine)    Allergies Allergies  Allergen Reactions  . Codeine Nausea And Vomiting and Other (See Comments)    Severe headaches (tolerates Dilaudid)  . Hydrocodone-Acetaminophen Nausea And Vomiting and Other (See Comments)    Severe headaches (tolerates Dilaudid)  . Oxycodone-Acetaminophen Nausea And Vomiting and Other (See Comments)    Severe headaches (tolerates Dilaudid)  . Oxycodone-Aspirin Nausea And Vomiting  and Other (See Comments)    Severe headaches (tolerates Dilaudid)    SURGICAL HISTORY She  has a past surgical history that includes Knee Arthroplasty (2010); Dilation and curettage of uterus; Tonsillectomy; Back surgery (1999); Total shoulder arthroplasty (02/10/2011); and Total shoulder arthroplasty (12/22/2011). FAMILY HISTORY Her family history includes Breast cancer in her paternal grandmother; Breast cancer (age of onset: 53) in her mother; Diabetes in her paternal grandfather; Esophageal cancer in her brother; Heart disease in her brother, maternal grandfather, and maternal grandmother; Hypertension in her father. SOCIAL HISTORY She  reports that she quit smoking about 28 years ago. Her smoking use included cigarettes. She has a 5.00 pack-year smoking history. She has never used smokeless tobacco. She reports current alcohol use of about 8.0 standard drinks of alcohol per week. She reports that she does not use drugs.  Review of Systems  Constitutional: Negative for malaise/fatigue and weight loss.  HENT: Negative for hearing loss and tinnitus.   Eyes: Negative for blurred vision and double vision.  Respiratory: Negative for cough, sputum production, shortness of breath and wheezing.   Cardiovascular: Negative for chest pain, palpitations, orthopnea, claudication, leg swelling and PND.  Gastrointestinal: Negative for abdominal pain, blood in stool, constipation, diarrhea, heartburn, melena, nausea and vomiting.  Genitourinary: Negative.   Musculoskeletal: Negative for falls, joint pain and myalgias.  Skin: Negative for rash.  Neurological: Negative for dizziness, tingling, sensory change, weakness and headaches.  Endo/Heme/Allergies: Negative for polydipsia.  Psychiatric/Behavioral: Negative.  Negative for depression, memory loss, substance abuse and suicidal ideas. The patient is not nervous/anxious and does not have insomnia.   All other systems reviewed and are  negative.    Objective:     Blood pressure 130/72, pulse (!) 52, temperature (!) 97.5 F (36.4 C), height 5' 1.5" (1.562 m), weight 142 lb 6.4 oz (64.6 kg), SpO2 99 %. Body mass index is 26.47 kg/m.  General appearance: alert, no distress, WD/WN, female HEENT: normocephalic, sclerae anicteric, TMs pearly, nares patent, no discharge or erythema, pharynx normal Oral cavity: MMM, no lesions Neck: supple, no lymphadenopathy, no thyromegaly, no masses Heart: RRR, normal S1, S2, no murmurs Lungs: CTA bilaterally, no wheezes, rhonchi, or rales Abdomen: +bs, soft, non tender, non distended, no masses, no hepatomegaly, no splenomegaly Musculoskeletal: nontender, no swelling, no obvious deformity Extremities: no edema, no cyanosis, no clubbing. Left leg with non-tender enlarged varicose vein to medial thigh Pulses: 2+ symmetric, upper and lower extremities, normal cap refill Breasts: declines breast exam today -  Neurological: alert, oriented  x 3, CN2-12 intact, strength normal upper extremities and lower extremities, sensation normal throughout, DTRs 2+ throughout, no cerebellar signs, gait normal Psychiatric: normal affect, behavior normal, pleasant  Skin: warm, dry, intact; she has scaling with erythematous base to R hand thumb, left ankle  EKG:  Unchanged T wave inversion III and aVf, sinus brady with PACs, borderline 1st degree AV block  Has had cardio work up in the past, no symptoms, continue to monitor  Izora Ribas, NP   02/13/2020

## 2020-02-13 ENCOUNTER — Ambulatory Visit (INDEPENDENT_AMBULATORY_CARE_PROVIDER_SITE_OTHER): Payer: PPO | Admitting: Adult Health

## 2020-02-13 ENCOUNTER — Other Ambulatory Visit: Payer: Self-pay

## 2020-02-13 ENCOUNTER — Encounter: Payer: Self-pay | Admitting: Adult Health

## 2020-02-13 VITALS — BP 130/72 | HR 52 | Temp 97.5°F | Ht 61.5 in | Wt 142.4 lb

## 2020-02-13 DIAGNOSIS — M1711 Unilateral primary osteoarthritis, right knee: Secondary | ICD-10-CM

## 2020-02-13 DIAGNOSIS — Z23 Encounter for immunization: Secondary | ICD-10-CM | POA: Diagnosis not present

## 2020-02-13 DIAGNOSIS — I10 Essential (primary) hypertension: Secondary | ICD-10-CM

## 2020-02-13 DIAGNOSIS — K5790 Diverticulosis of intestine, part unspecified, without perforation or abscess without bleeding: Secondary | ICD-10-CM

## 2020-02-13 DIAGNOSIS — Z0000 Encounter for general adult medical examination without abnormal findings: Secondary | ICD-10-CM

## 2020-02-13 DIAGNOSIS — Z136 Encounter for screening for cardiovascular disorders: Secondary | ICD-10-CM | POA: Diagnosis not present

## 2020-02-13 DIAGNOSIS — L570 Actinic keratosis: Secondary | ICD-10-CM

## 2020-02-13 DIAGNOSIS — R7309 Other abnormal glucose: Secondary | ICD-10-CM

## 2020-02-13 DIAGNOSIS — E559 Vitamin D deficiency, unspecified: Secondary | ICD-10-CM

## 2020-02-13 DIAGNOSIS — R609 Edema, unspecified: Secondary | ICD-10-CM

## 2020-02-13 DIAGNOSIS — J302 Other seasonal allergic rhinitis: Secondary | ICD-10-CM

## 2020-02-13 DIAGNOSIS — Z131 Encounter for screening for diabetes mellitus: Secondary | ICD-10-CM | POA: Diagnosis not present

## 2020-02-13 DIAGNOSIS — Z79899 Other long term (current) drug therapy: Secondary | ICD-10-CM

## 2020-02-13 DIAGNOSIS — Z1389 Encounter for screening for other disorder: Secondary | ICD-10-CM | POA: Diagnosis not present

## 2020-02-13 DIAGNOSIS — N9489 Other specified conditions associated with female genital organs and menstrual cycle: Secondary | ICD-10-CM

## 2020-02-13 DIAGNOSIS — E785 Hyperlipidemia, unspecified: Secondary | ICD-10-CM

## 2020-02-13 DIAGNOSIS — E538 Deficiency of other specified B group vitamins: Secondary | ICD-10-CM

## 2020-02-13 DIAGNOSIS — M810 Age-related osteoporosis without current pathological fracture: Secondary | ICD-10-CM

## 2020-02-13 DIAGNOSIS — Z1329 Encounter for screening for other suspected endocrine disorder: Secondary | ICD-10-CM | POA: Diagnosis not present

## 2020-02-13 DIAGNOSIS — I771 Stricture of artery: Secondary | ICD-10-CM

## 2020-02-13 MED ORDER — FUROSEMIDE 20 MG PO TABS: ORAL_TABLET | ORAL | 3 refills | Status: DC

## 2020-02-13 MED ORDER — B-12 1000 MCG SL SUBL: 1.0000 | SUBLINGUAL_TABLET | Freq: Every day | SUBLINGUAL | 0 refills | Status: DC

## 2020-02-13 NOTE — Addendum Note (Signed)
Addended by: Chancy Hurter on: 02/13/2020 11:35 AM   Modules accepted: Orders

## 2020-02-13 NOTE — Patient Instructions (Addendum)
Ms. Kuntzman , Thank you for taking time to come for your Annual Wellness Visit. I appreciate your ongoing commitment to your health goals. Please review the following plan we discussed and let me know if I can assist you in the future.   These are the goals we discussed: Goals    . Blood Pressure < 130/80    . Exercise 150 min/wk Moderate Activity    . LDL CALC < 130    . Reduce alcohol intake     Limit to 1 glass a wine per day       This is a list of the screening recommended for you and due dates:  Health Maintenance  Topic Date Due  . Flu Shot  10/27/2019  .  Hepatitis C: One time screening is recommended by Center for Disease Control  (CDC) for  adults born from [birth year] through 1964.   02/12/2021*  . Tetanus Vaccine  05/26/2021  . DEXA scan (bone density measurement)  Completed  . COVID-19 Vaccine  Completed  . Pneumonia vaccines  Completed  *Topic was postponed. The date shown is not the original due date.     Know what a healthy weight is for you (roughly BMI <25) and aim to maintain this  Aim for 7+ servings of fruits and vegetables daily  65-80+ fluid ounces of water or unsweet tea for healthy kidneys  Limit to max 1 drink of alcohol per day; avoid smoking/tobacco  Limit animal fats in diet for cholesterol and heart health - choose grass fed whenever available  Avoid highly processed foods, and foods high in saturated/trans fats  Aim for low stress - take time to unwind and care for your mental health  Aim for 150 min of moderate intensity exercise weekly for heart health, and weights twice weekly for bone health  Aim for 7-9 hours of sleep daily      High-Fiber Diet Fiber, also called dietary fiber, is a type of carbohydrate that is found in fruits, vegetables, whole grains, and beans. A high-fiber diet can have many health benefits. Your health care provider may recommend a high-fiber diet to help:  Prevent constipation. Fiber can make your bowel movements  more regular.  Lower your cholesterol.  Relieve the following conditions: ? Swelling of veins in the anus (hemorrhoids). ? Swelling and irritation (inflammation) of specific areas of the digestive tract (uncomplicated diverticulosis). ? A problem of the large intestine (colon) that sometimes causes pain and diarrhea (irritable bowel syndrome, IBS).  Prevent overeating as part of a weight-loss plan.  Prevent heart disease, type 2 diabetes, and certain cancers. What is my plan? The recommended daily fiber intake in grams (g) includes:  38 g for men age 1 or younger.  30 g for men over age 41.  66 g for women age 2 or younger.  21 g for women over age 77. You can get the recommended daily intake of dietary fiber by:  Eating a variety of fruits, vegetables, grains, and beans.  Taking a fiber supplement, if it is not possible to get enough fiber through your diet. What do I need to know about a high-fiber diet?  It is better to get fiber through food sources rather than from fiber supplements. There is not a lot of research about how effective supplements are.  Always check the fiber content on the nutrition facts label of any prepackaged food. Look for foods that contain 5 g of fiber or more per serving.  Talk  with a diet and nutrition specialist (dietitian) if you have questions about specific foods that are recommended or not recommended for your medical condition, especially if those foods are not listed below.  Gradually increase how much fiber you consume. If you increase your intake of dietary fiber too quickly, you may have bloating, cramping, or gas.  Drink plenty of water. Water helps you to digest fiber. What are tips for following this plan?  Eat a wide variety of high-fiber foods.  Make sure that half of the grains that you eat each day are whole grains.  Eat breads and cereals that are made with whole-grain flour instead of refined flour or white flour.  Eat  brown rice, bulgur wheat, or millet instead of white rice.  Start the day with a breakfast that is high in fiber, such as a cereal that contains 5 g of fiber or more per serving.  Use beans in place of meat in soups, salads, and pasta dishes.  Eat high-fiber snacks, such as berries, raw vegetables, nuts, and popcorn.  Choose whole fruits and vegetables instead of processed forms like juice or sauce. What foods can I eat?  Fruits Berries. Pears. Apples. Oranges. Avocado. Prunes and raisins. Dried figs. Vegetables Sweet potatoes. Spinach. Kale. Artichokes. Cabbage. Broccoli. Cauliflower. Green peas. Carrots. Squash. Grains Whole-grain breads. Multigrain cereal. Oats and oatmeal. Brown rice. Barley. Bulgur wheat. [place]. Quinoa. Bran muffins. Popcorn. Rye wafer crackers. Meats and other proteins Navy, kidney, and pinto beans. Soybeans. Split peas. Lentils. Nuts and seeds. Dairy Fiber-fortified yogurt. Beverages Fiber-fortified soy milk. Fiber-fortified orange juice. Other foods Fiber bars. The items listed above may not be a complete list of recommended foods and beverages. Contact a dietitian for more options. What foods are not recommended? Fruits Fruit juice. Cooked, strained fruit. Vegetables Fried potatoes. Canned vegetables. Well-cooked vegetables. Grains White bread. Pasta made with refined flour. White rice. Meats and other proteins Fatty cuts of meat. Fried chicken or fried fish. Dairy Milk. Yogurt. Cream cheese. Sour cream. Fats and oils Butters. Beverages Soft drinks. Other foods Cakes and pastries. The items listed above may not be a complete list of foods and beverages to avoid. Contact a dietitian for more information. Summary  Fiber is a type of carbohydrate. It is found in fruits, vegetables, whole grains, and beans.  There are many health benefits of eating a high-fiber diet, such as preventing constipation, lowering blood cholesterol, helping with weight  loss, and reducing your risk of heart disease, diabetes, and certain cancers.  Gradually increase your intake of fiber. Increasing too fast can result in cramping, bloating, and gas. Drink plenty of water while you increase your fiber.  The best sources of fiber include whole fruits and vegetables, whole grains, nuts, seeds, and beans. This information is not intended to replace advice given to you by your health care provider. Make sure you discuss any questions you have with your health care provider. Document Revised: 01/16/2017 Document Reviewed: 01/16/2017 Elsevier Patient Education  2020 [employer].

## 2020-02-14 LAB — CBC WITH DIFFERENTIAL/PLATELET
Absolute Monocytes: 691 cells/uL (ref 200–950)
Basophils Absolute: 51 cells/uL (ref 0–200)
Basophils Relative: 0.8 %
Eosinophils Absolute: 160 cells/uL (ref 15–500)
Eosinophils Relative: 2.5 %
HCT: 36.7 % (ref 35.0–45.0)
Hemoglobin: 12.3 g/dL (ref 11.7–15.5)
Lymphs Abs: 2432 cells/uL (ref 850–3900)
MCH: 30.2 pg (ref 27.0–33.0)
MCHC: 33.5 g/dL (ref 32.0–36.0)
MCV: 90.2 fL (ref 80.0–100.0)
MPV: 9.3 fL (ref 7.5–12.5)
Monocytes Relative: 10.8 %
Neutro Abs: 3066 cells/uL (ref 1500–7800)
Neutrophils Relative %: 47.9 %
Platelets: 355 10*3/uL (ref 140–400)
RBC: 4.07 10*6/uL (ref 3.80–5.10)
RDW: 12 % (ref 11.0–15.0)
Total Lymphocyte: 38 %
WBC: 6.4 10*3/uL (ref 3.8–10.8)

## 2020-02-14 LAB — COMPLETE METABOLIC PANEL WITH GFR
AG Ratio: 1.7 (calc) (ref 1.0–2.5)
ALT: 24 U/L (ref 6–29)
AST: 22 U/L (ref 10–35)
Albumin: 4.4 g/dL (ref 3.6–5.1)
Alkaline phosphatase (APISO): 66 U/L (ref 37–153)
BUN: 10 mg/dL (ref 7–25)
CO2: 27 mmol/L (ref 20–32)
Calcium: 10 mg/dL (ref 8.6–10.4)
Chloride: 98 mmol/L (ref 98–110)
Creat: 0.74 mg/dL (ref 0.60–0.93)
GFR, Est African American: 90 mL/min/{1.73_m2} (ref >=60)
GFR, Est Non African American: 78 mL/min/{1.73_m2} (ref >=60)
Globulin: 2.6 g/dL (calc) (ref 1.9–3.7)
Glucose, Bld: 93 mg/dL (ref 65–99)
Potassium: 4.3 mmol/L (ref 3.5–5.3)
Sodium: 134 mmol/L — ABNORMAL LOW (ref 135–146)
Total Bilirubin: 0.7 mg/dL (ref 0.2–1.2)
Total Protein: 7 g/dL (ref 6.1–8.1)

## 2020-02-14 LAB — VITAMIN D 25 HYDROXY (VIT D DEFICIENCY, FRACTURES): Vit D, 25-Hydroxy: 70 ng/mL (ref 30–100)

## 2020-02-14 LAB — URINALYSIS, ROUTINE W REFLEX MICROSCOPIC
Bilirubin Urine: NEGATIVE
Glucose, UA: NEGATIVE
Hgb urine dipstick: NEGATIVE
Ketones, ur: NEGATIVE
Leukocytes,Ua: NEGATIVE
Nitrite: NEGATIVE
Protein, ur: NEGATIVE
Specific Gravity, Urine: 1.006 (ref 1.001–1.03)
pH: 6 (ref 5.0–8.0)

## 2020-02-14 LAB — LIPID PANEL
Cholesterol: 214 mg/dL — ABNORMAL HIGH (ref <200)
HDL: 67 mg/dL (ref >=50)
LDL Cholesterol (Calc): 122 mg/dL (calc) — ABNORMAL HIGH
Non-HDL Cholesterol (Calc): 147 mg/dL (calc) — ABNORMAL HIGH (ref <130)
Total CHOL/HDL Ratio: 3.2 (calc) (ref <5.0)
Triglycerides: 133 mg/dL (ref <150)

## 2020-02-14 LAB — HEMOGLOBIN A1C
Hgb A1c MFr Bld: 5.4 % of total Hgb (ref <5.7)
Mean Plasma Glucose: 108 (calc)
eAG (mmol/L): 6 (calc)

## 2020-02-14 LAB — TSH: TSH: 1.41 mIU/L (ref 0.40–4.50)

## 2020-02-14 LAB — MICROALBUMIN / CREATININE URINE RATIO
Creatinine, Urine: 42 mg/dL (ref 20–275)
Microalb Creat Ratio: 5 mcg/mg creat (ref <30)
Microalb, Ur: 0.2 mg/dL

## 2020-02-14 LAB — MAGNESIUM: Magnesium: 2 mg/dL (ref 1.5–2.5)

## 2020-02-24 ENCOUNTER — Ambulatory Visit
Admission: RE | Admit: 2020-02-24 | Discharge: 2020-02-24 | Disposition: A | Discharge status: Home / Self Care | Payer: PPO | Source: Ambulatory Visit | Attending: Physician Assistant | Admitting: Physician Assistant

## 2020-02-24 ENCOUNTER — Other Ambulatory Visit: Payer: Self-pay

## 2020-02-24 ENCOUNTER — Other Ambulatory Visit: Payer: Self-pay | Admitting: Diagnostic Radiology

## 2020-02-24 DIAGNOSIS — M810 Age-related osteoporosis without current pathological fracture: Secondary | ICD-10-CM | POA: Diagnosis not present

## 2020-02-24 DIAGNOSIS — Z9189 Other specified personal risk factors, not elsewhere classified: Secondary | ICD-10-CM

## 2020-02-24 DIAGNOSIS — Z1231 Encounter for screening mammogram for malignant neoplasm of breast: Secondary | ICD-10-CM

## 2020-02-25 ENCOUNTER — Other Ambulatory Visit: Payer: Self-pay | Admitting: Adult Health

## 2020-02-25 DIAGNOSIS — M810 Age-related osteoporosis without current pathological fracture: Secondary | ICD-10-CM

## 2020-02-25 MED ORDER — ALENDRONATE SODIUM 70 MG PO TABS: 70.0000 mg | ORAL_TABLET | ORAL | 3 refills | Status: DC

## 2020-03-10 DIAGNOSIS — W228XXA Striking against or struck by other objects, initial encounter: Secondary | ICD-10-CM | POA: Diagnosis not present

## 2020-03-10 DIAGNOSIS — S0091XA Abrasion of unspecified part of head, initial encounter: Secondary | ICD-10-CM | POA: Diagnosis not present

## 2020-03-12 ENCOUNTER — Ambulatory Visit: Payer: PPO | Admitting: Podiatry

## 2020-04-07 ENCOUNTER — Telehealth: Payer: Self-pay

## 2020-04-07 NOTE — Telephone Encounter (Signed)
Patient states that she has a rash under her left breast, and is treating it with neosporin but now it has turned into a blister. Does not itch but it does burn and is peeling. Doesn't want to come into the office due to Covid.   I spoke with patient, she is seeing Dermatology tomorrow.

## 2020-04-08 ENCOUNTER — Other Ambulatory Visit: Payer: Self-pay

## 2020-04-08 ENCOUNTER — Ambulatory Visit: Payer: PPO | Admitting: Podiatry

## 2020-04-08 ENCOUNTER — Ambulatory Visit (INDEPENDENT_AMBULATORY_CARE_PROVIDER_SITE_OTHER): Payer: PPO

## 2020-04-08 DIAGNOSIS — M79672 Pain in left foot: Secondary | ICD-10-CM | POA: Diagnosis not present

## 2020-04-08 DIAGNOSIS — M79671 Pain in right foot: Secondary | ICD-10-CM

## 2020-04-08 DIAGNOSIS — L821 Other seborrheic keratosis: Secondary | ICD-10-CM | POA: Diagnosis not present

## 2020-04-08 DIAGNOSIS — M722 Plantar fascial fibromatosis: Secondary | ICD-10-CM

## 2020-04-08 DIAGNOSIS — L304 Erythema intertrigo: Secondary | ICD-10-CM | POA: Diagnosis not present

## 2020-04-08 MED ORDER — TRIAMCINOLONE ACETONIDE 10 MG/ML IJ SUSP
10.0000 mg | Freq: Once | INTRAMUSCULAR | Status: AC
  Administered 2020-04-08: 10 mg

## 2020-04-08 NOTE — Progress Notes (Signed)
Subjective:   Patient ID: Michaela Kelley, female   DOB: 80 y.o.   MRN: [id number]   HPI Patient states both of her heels have become very sore and are worse when she gets up in the morning   ROS      Objective:  Physical Exam  Neurovascular status intact with exquisite discomfort noted plantar heel region bilateral at the insertion of the tendon into the calcaneus     Assessment:  Acute plantar fasciitis bilateral      Plan:  H&P condition reviewed sterile prep done and injected the plantar fascial bilateral 3 mg Kenalog 5 mg Xylocaine and applied sterile dressings.  Reappoint as needed  X-rays indicate small spur no indication stress fracture arthritis

## 2020-05-14 ENCOUNTER — Telehealth: Payer: Self-pay

## 2020-05-14 NOTE — Telephone Encounter (Signed)
Had diverticulitis in the past and thinks its the same thing because the symptoms are similar. Please advise.

## 2020-05-23 ENCOUNTER — Other Ambulatory Visit: Payer: Self-pay | Admitting: Internal Medicine

## 2020-05-23 MED ORDER — CIPROFLOXACIN HCL 500 MG PO TABS: ORAL_TABLET | ORAL | 0 refills | Status: DC

## 2020-05-23 MED ORDER — METRONIDAZOLE 500 MG PO TABS: ORAL_TABLET | ORAL | 0 refills | Status: DC

## 2020-05-23 NOTE — Progress Notes (Signed)
  Sat 2/26  At 9:50 pm   Patient called c/o 2 day hx/o lower abd pain similar to last Oct when dx'd with Diverticulitis by CT scan & treated with Abx's. Says abd soft - had fever yesterday.  Sent in Rx's for Cipro / Flagyl and discussed ER precautions

## 2020-07-09 ENCOUNTER — Other Ambulatory Visit: Payer: Self-pay | Admitting: Internal Medicine

## 2020-07-16 DIAGNOSIS — M1711 Unilateral primary osteoarthritis, right knee: Secondary | ICD-10-CM | POA: Diagnosis not present

## 2020-07-27 ENCOUNTER — Telehealth: Payer: Self-pay | Admitting: *Deleted

## 2020-07-27 ENCOUNTER — Other Ambulatory Visit: Payer: Self-pay | Admitting: Internal Medicine

## 2020-07-27 DIAGNOSIS — M549 Dorsalgia, unspecified: Secondary | ICD-10-CM | POA: Diagnosis not present

## 2020-07-27 DIAGNOSIS — I10 Essential (primary) hypertension: Secondary | ICD-10-CM | POA: Diagnosis not present

## 2020-07-27 MED ORDER — OLMESARTAN MEDOXOMIL 40 MG PO TABS: ORAL_TABLET | ORAL | 1 refills | Status: DC

## 2020-07-27 NOTE — Telephone Encounter (Signed)
Returned call to patient regarding elevated BP and sharp pain near left breast and left shoulder blade. Patient's BP was 200/100 this morning and EMS came to her home. BP was 170/90, when EMS rechecked her BP. Patient stated her pain in shoulder blade seems to be related to movement. Per Dr Melford Aase, the patient can try her Celebrex. For the elevated BP, patient should continue Atenolol 50 mg in the morning and start Olmesartan 40 mg in the evening, per Dr Melford Aase. Patient has an appointment here 07/29/2020, but was advised to call back,if needed before the appointment.

## 2020-07-28 NOTE — Progress Notes (Signed)
    Future Appointments  Date Time Provider [place]  07/29/2020  2:30 PM Unk Pinto, MD [facility] None  08/19/2020 10:00 AM Garnet Sierras, NP [facility] None  02/15/2021  9:00 AM Liane Comber, NP [facility] None    History of Present Illness:     This very nice 80 yo WWF  presents with recent labile elevated BP's and EMS came to her house and she had a normal EKG. BP was 220/100 and 170/90. She denies any HA, dizziness, exertional CP., palpitations or dependent edema.  Medications  .  alendronate (FOSAMAX) 70 MG tablet, Take 1 tablet (70 mg total) by mouth every 7 (seven) days.    Marland Kitchen  atenolol (TENORMIN) 50 MG tablet, TAKE ONE TABLET DAILY BLOOD PRESSURE  .  furosemide (LASIX) 40 MG tablet, TAKE ONE TABLET EACH DAY\  .  olmesartan (BENICAR) 40 MG tablet, Take  1 tablet  Daily  for BP  .  montelukast 10 MG tablet, TAKE ONE TABLET DAILY   .  PROAIR HFA  inhaler, INHALE 2 PUFFS  EVERY 6 HOURS AS NEEDED    .  aspirin EC 81 MG tablet, Take  daily.    .  celecoxib 200 MG capsule, celecoxib 200 mg capsule  .  Cyanocobalamin (B-12) 1000 MCG SUBL, Place 1 tablet under the tongue daily.  Marland KitchenVITAMIN D), Take 4,000 Int'l Units by mouth daily.  .  fluorouracil (EFUDEX) 5 % cream, fluorouracil 5 % topical cream  .  MAGNESIUM PO, Take 150 mg by mouth daily.   .  TURMERIC PO, Take 800 mg by mouth 2 (two) times daily.   Problem list She has Hyperlipidemia; Essential hypertension; Seasonal allergic rhinitis; Vitamin D deficiency; Medication management; Tortuous aorta ([place]); Osteoporosis; Osteoarthritis of right knee; Diverticulosis; Pelvic congestion syndrome; and B12 deficiency on their problem list.   Observations/Objective:  BP 184/82   P 63   T 97.3 F    R 16   Ht 5\' 2"     Wt 145 lb 6.4 oz   SpO2 99%   BMI 26.59   HEENT - WNL. Neck - supple.  Chest - Clear equal BS. Tender along the Left chest wall about the  Lt 4th-5th dermatome location. No skin  changes.  Cor - Nl HS. RRR w/o sig MGR. PP 1(+). No edema. MS- FROM w/o deformities.  Gait Nl. Neuro -  Nl w/o focal abnormalities.   Assessment and Plan:  1. Accelerated hypertension  - amLODipine  10 MG tablet; Take  1/2 to 1 tablet  Daily  for BP  Dispense: 90 tablet; Refill: 1   2. Left-sided chest wall pain  - dexamethasone  4 MG tablet; Take 1 tab 3 x day - 3 days, then 2 x day - 3 days, then 1 tab daily  Dispense: 20 tablet; Refill: 0   Follow Up Instructions:       I discussed the assessment and treatment plan with the patient. The patient was provided an opportunity to ask questions and all were answered. The patient agreed with the plan and demonstrated an understanding of the instructions.       The patient was advised to call back or seek an in-person evaluation if the symptoms worsen or if the condition fails to improve as anticipated.   Kirtland Bouchard, MD

## 2020-07-29 ENCOUNTER — Other Ambulatory Visit: Payer: Self-pay

## 2020-07-29 ENCOUNTER — Encounter: Payer: Self-pay | Admitting: Internal Medicine

## 2020-07-29 ENCOUNTER — Ambulatory Visit (INDEPENDENT_AMBULATORY_CARE_PROVIDER_SITE_OTHER): Payer: PPO | Admitting: Internal Medicine

## 2020-07-29 VITALS — BP 184/82 | HR 63 | Temp 97.3°F | Resp 16 | Ht 62.0 in | Wt 145.4 lb

## 2020-07-29 DIAGNOSIS — I10 Essential (primary) hypertension: Secondary | ICD-10-CM

## 2020-07-29 DIAGNOSIS — R0789 Other chest pain: Secondary | ICD-10-CM

## 2020-07-29 MED ORDER — AMLODIPINE BESYLATE 10 MG PO TABS: ORAL_TABLET | ORAL | 1 refills | Status: DC

## 2020-07-29 MED ORDER — DEXAMETHASONE 4 MG PO TABS: ORAL_TABLET | ORAL | 0 refills | Status: DC

## 2020-07-30 MED ORDER — AMLODIPINE BESYLATE 10 MG PO TABS: ORAL_TABLET | ORAL | 1 refills | Status: DC

## 2020-07-30 MED ORDER — DEXAMETHASONE 4 MG PO TABS: ORAL_TABLET | ORAL | 0 refills | Status: DC

## 2020-07-30 NOTE — Addendum Note (Signed)
Addended by: Unk Pinto on: 07/30/2020 09:23 AM   Modules accepted: Orders

## 2020-08-04 DIAGNOSIS — H52203 Unspecified astigmatism, bilateral: Secondary | ICD-10-CM | POA: Diagnosis not present

## 2020-08-04 DIAGNOSIS — H35372 Puckering of macula, left eye: Secondary | ICD-10-CM | POA: Diagnosis not present

## 2020-08-04 DIAGNOSIS — Z961 Presence of intraocular lens: Secondary | ICD-10-CM | POA: Diagnosis not present

## 2020-08-09 NOTE — Progress Notes (Signed)
       App't  Cancelled   due   to   Covid                                                                                                                                       The patient is a very nice 80 yo WWF seen recently for accelerated HTN  Of 220/100 and 170/90 and she was added amlodipine 10 mg with her Atenolol 50 mg, Furosemide 40 mg and Olmesartan 40 mg . Yesterday on 05/14, her daughter reported the patient tested (+) positive for Covid.

## 2020-08-10 ENCOUNTER — Ambulatory Visit: Payer: PPO | Admitting: Internal Medicine

## 2020-08-19 ENCOUNTER — Ambulatory Visit: Payer: PPO | Admitting: Adult Health Nurse Practitioner

## 2020-09-07 NOTE — Progress Notes (Signed)
MEDICARE ANNUAL WELLNESS  Assessment:   Annual Medicare Wellness Visit Due annually   Essential hypertension Well controlled; hold lasix if needed for low BPs - continue medications, DASH diet, exercise and monitor at home. Call if greater than 130/80.  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   Hyperlipidemia, unspecified hyperlipidemia type -     Lipid panel check lipids decrease fatty foods increase activity.  Discussed goals at length; she would like to try gentle medication ezetimibe for LDL goal <100 to minimize risk due to excellent life expectancy  Medication management -     Magnesium  Vitamin D deficiency -     VITAMIN D 25 Hydroxy (Vit-D Deficiency, Fractures)  Seasonal allergic rhinitis, unspecified trigger Continue meds, singulaire and PRN zyrtec is working well   Pelvic congestion syndrome Incidentally noted on imaging; denies pelvic or vaginal sx; monitor   Osteoporosis without current pathological fracture, unspecified osteoporosis type Repeat DEXA 2 years, continue calcium, vitamin D, weight bearing exercises Stopped alendronate due to intolerable arthralgias with attempts x 2    Future Appointments  Date Time Provider [place]  02/15/2021  9:00 AM Liane Comber, NP [facility] None  09/08/2021  2:30 PM Liane Comber, NP [facility] None      Subjective:  Michaela Kelley is a 80 y.o. female who presents for AWV and follow up for cholesterol, allergies, and vitamin D.   She had covid 79 early May, reports is doing much better and denies sequela.   She had abdominal pain, CT abd 01/15/2020 showed focal diverticulitis involving the distal sigmoid colon, she responded well to abx. Also incidentally noted enlarged left-sided periuterine veins are noted within large left ovarian vein, consistent with pelvic congestion  syndrome, denies notable vaginal or pelvic sx.   She has orthotic for left foot pain, seeing Dr. Paulla Dolly, doing well recently.  Follows with [facility], Dr. Delilah Shan for knee gel injections. Will take celebrex occasionally, will do topical CBD. Broke out with rash.    Allergies are doing well at this time, mainly takes Singulair in the spring and fall.    BMI is Body mass index is 26.16 kg/m., she has been working on diet and exercise. She walks daily and golfs regularly, also doing light weights and resistance bands. Just restared water aerobics.  Wt Readings from Last 3 Encounters:  09/08/20 143 lb (64.9 kg)  07/29/20 145 lb 6.4 oz (66 kg)  02/13/20 142 lb 6.4 oz (64.6 kg)   She has broken blood vessels bilateral legs, worse left leg, no pain, wearing compression. she is taking 20 mg lasix daily, no swelling, atenolol 50 mg daily   Her blood pressure has been controlled at home, today their BP is BP: 106/68. She stopped taking amlodipine due to hypotension, doing well since.   She does workout, walking, stretching, water aerobics and golfing.  She denies chest pain, shortness of breath, dizziness.  She is not on cholesterol medication, on omega 3 supplement, she is off lipitor due to myalgias and mild/mod elevations not aggressively pursued secondary to benign history. Her cholesterol is not at goal. The cholesterol last visit was:   Lab Results  Component Value Date   CHOL 214 (H) 02/13/2020   HDL 67 02/13/2020   LDLCALC 122 (H) 02/13/2020   TRIG 133 02/13/2020   CHOLHDL  3.2 02/13/2020    She has been working on diet and exercise for glucose management, and denies paresthesia of the feet, polydipsia, polyuria and visual disturbances. Last A1C in the office was:  Lab Results  Component Value Date   HGBA1C 5.4 02/13/2020    She has stable CKD II monitored at this office:  Lab Results  Component Value Date   GFRNONAA 78 02/13/2020   Patient is on Vitamin D supplement taking 5000  IU daily    Lab Results  Component Value Date   VD25OH 21 02/13/2020   She did not start sublingual B12 has taken B complex for years, now on sublingual  Lab Results  Component Value Date   VITAMINB12 372 08/14/2019     Medication Review: Current Outpatient Medications on File Prior to Visit  Medication Sig Dispense Refill   aspirin EC 81 MG tablet Take 81 mg by mouth daily.     atenolol (TENORMIN) 50 MG tablet TAKE ONE TABLET DAILY BLOOD PRESSURE 90 tablet 3   celecoxib (CELEBREX) 200 MG capsule celecoxib 200 mg capsule     Cholecalciferol (VITAMIN D PO) Take 5,000 Int'l Units by mouth daily.     Cyanocobalamin (B-12) 1000 MCG SUBL Place 1 tablet under the tongue daily. (Patient taking differently: Place 1 tablet under the tongue daily. Takes liquid form) 30 tablet 0   furosemide (LASIX) 40 MG tablet TAKE ONE TABLET EACH DAY FOR BLOOD PRESSURE AND FLUID RETENTION/ANKLE SWELLING 90 tablet 0   MAGNESIUM PO Take 500 mg by mouth daily. Magesium Oxide     montelukast (SINGULAIR) 10 MG tablet TAKE ONE TABLET DAILY FOR ALLERGY 90 tablet 3   olmesartan (BENICAR) 40 MG tablet Take  1 tablet  Daily  for BP 90 tablet 1   PROAIR HFA 108 (90 Base) MCG/ACT inhaler INHALE 2 PUFFS INTO THE LUNGS EVERY 6 HOURS AS NEEDED FOR WHEEZING OR SHORTNESS OF BREATH 8.5 g 0   TURMERIC PO Take 800 mg by mouth 2 (two) times daily. Curcumin Turmeric     amLODipine (NORVASC) 10 MG tablet Take  1/2 to 1 tablet  Daily  for BP (Patient not taking: Reported on 09/08/2020) 90 tablet 1   fluorouracil (EFUDEX) 5 % cream fluorouracil 5 % topical cream (Patient not taking: Reported on 09/08/2020)     No current facility-administered medications on file prior to visit.    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[place]) 01/24/2017   Medication management 10/03/2013   Seasonal allergic rhinitis    Vitamin D deficiency    Hyperlipidemia 01/02/2008   Essential hypertension 01/02/2008    Screening Tests Immunization History  Administered Date(s) Administered   Influenza, High Dose Seasonal PF 03/10/2015, 01/10/2017, 02/01/2018, 02/12/2019, 02/13/2020   PFIZER(Purple Top)SARS-COV-2 Vaccination 04/16/2019, 05/07/2019   Pneumococcal Conjugate-13 05/05/2014   Pneumococcal Polysaccharide-23 06/28/2006   Td 05/27/2011   Preventative care: Tetanus: 2013 Pneumovax: 2008 Prevnar 13: 2016 Flu vaccine: 2021 Shingrix: check with insurance and get at pharmacy  Covid 19: 2/2, 2021, pfizer + booster  Pap: 2011 normal- declines another MGM: 02/24/2020 DEXA: DEXA 02/24/2020 - L forearm only, didn't tolerate fosamax, will do lifestyle   Colonoscopy: 2018 (Dr. Sharlett Iles) - tubular adenoma x 1 - ? 5 year follow up if desired  CT chest 2010  CXR 2017  Names of Other Physician/Practitioners you currently use: 1. [facility] here for primary care 2. Dr. Celene Squibb, eye doctor, 07/2020 3. Dr. Junita Push, dentist, last 2022, goes q6 4. Dr. Sharlett Iles, GI 5. [facility] 6. Dr. Marguerite Olea, derm, PRN  Patient Care Team: Unk Pinto, MD as PCP - General (Internal Medicine)    Allergies Allergies  Allergen Reactions   Atorvastatin Anaphylaxis    Myalgias Myalgias   Codeine Nausea And Vomiting and Other (See Comments)    Severe headaches (tolerates Dilaudid) Other reaction(s): Other (See Comments) Severe headaches (tolerates Dilaudid)   Alendronate Other (See Comments)    Severe arthrlalgias   Hydrocodone-Acetaminophen Nausea And Vomiting and Other (See Comments)    Severe headaches (tolerates Dilaudid)   Hydrocodone-Acetaminophen Nausea And Vomiting    Other reaction(s): Other (See Comments) Severe headaches (tolerates Dilaudid)    Oxycodone-Acetaminophen Nausea And Vomiting and Other (See Comments)    Severe headaches (tolerates Dilaudid)   Oxycodone-Acetaminophen Nausea And Vomiting    Other reaction(s): Other (See Comments) Severe headaches (tolerates Dilaudid)    Oxycodone-Aspirin Nausea And Vomiting and Other (See Comments)    Severe headaches (tolerates Dilaudid)   Oxycodone-Aspirin Nausea And Vomiting    Other reaction(s): Other (See Comments) Severe headaches (tolerates Dilaudid)    SURGICAL HISTORY She  has a past surgical history that includes Knee Arthroplasty (2010); Dilation and curettage of uterus; Tonsillectomy; Back surgery (1999); Total shoulder arthroplasty (02/10/2011); and Total shoulder arthroplasty (12/22/2011). FAMILY HISTORY Her family history includes Breast cancer in her paternal grandmother; Breast cancer (age of onset: 60) in her mother; Diabetes in her paternal grandfather; Esophageal cancer in her brother; Heart disease in her brother, maternal grandfather, and maternal grandmother; Hypertension in her father. SOCIAL HISTORY She  reports that she quit smoking about 28 years ago. Her smoking use included cigarettes. She has a 5.00 pack-year smoking history. She has never used smokeless tobacco. She reports current alcohol use of about 8.0 standard drinks of alcohol per week. She reports that she does not use drugs.  MEDICARE WELLNESS OBJECTIVES: Physical activity: Current Exercise Habits: Home exercise routine, Type of exercise: Other - see comments;walking;strength training/weights, Time (Minutes): 60, Frequency (Times/Week): 5, Weekly Exercise (Minutes/Week): 300, Intensity: Mild, Exercise limited by: orthopedic condition(s) Cardiac risk factors: Cardiac Risk Factors include: advanced age (>40men, >7 women);dyslipidemia;hypertension;smoking/ tobacco exposure Depression/mood screen:   Depression screen [facility] 2/9 02/13/2020  Decreased Interest 0  Down, Depressed, Hopeless 0  PHQ - 2 Score 0     ADLs:  In your present state of health, do you have any difficulty performing the following activities: 09/08/2020  Hearing? N  Comment has hearing aids  Vision? N  Difficulty concentrating or making decisions? N  Walking or climbing stairs? N  Dressing or bathing? N  Doing errands, shopping? N  Some recent data might be hidden     Cognitive Testing  Alert? Yes  Normal Appearance?Yes  Oriented to person? Yes  Place? Yes   Time? Yes  Recall of three objects?  Yes  Can perform simple calculations? Yes  Displays appropriate judgment?Yes  Can read the correct time from a watch face?Yes  EOL planning: Does Patient Have a Medical Advance Directive?: Yes Type of Advance Directive: Healthcare Power of Attorney, Living will Does patient want to make changes to medical advance directive?: No - Patient declined Copy of [place] in Chart?: No - copy requested      Review of Systems  Constitutional:  Negative for malaise/fatigue and weight loss.  HENT:  Negative for hearing loss and tinnitus.   Eyes:  Negative for blurred vision and double vision.  Respiratory:  Negative for cough, sputum production, shortness of breath and wheezing.   Cardiovascular:  Negative for chest pain, palpitations, orthopnea, claudication, leg swelling and PND.  Gastrointestinal:  Negative for abdominal pain, blood in stool, constipation, diarrhea, heartburn, melena, nausea and vomiting.  Genitourinary: Negative.   Musculoskeletal:  Negative for falls, joint pain and myalgias.  Skin:  Negative for rash.  Neurological:  Negative for dizziness, tingling, sensory change, weakness and headaches.  Endo/Heme/Allergies:  Negative for polydipsia.  Psychiatric/Behavioral: Negative.  Negative for depression, memory loss, substance abuse and suicidal ideas. The patient is not nervous/anxious and does not have insomnia.   All other systems reviewed and are negative.   Objective:     Blood  pressure 106/68, pulse 68, temperature (!) 97.3 F (36.3 C), weight 143 lb (64.9 kg), SpO2 99 %. Body mass index is 26.16 kg/m.  General appearance: alert, no distress, WD/WN, female HEENT: normocephalic, sclerae anicteric, TMs pearly, nares patent, no discharge or erythema, pharynx normal Oral cavity: MMM, no lesions Neck: supple, no lymphadenopathy, no thyromegaly, no masses Heart: RRR, normal S1, S2, no murmurs Lungs: CTA bilaterally, no wheezes, rhonchi, or rales Abdomen: +bs, soft, non tender, non distended, no masses, no hepatomegaly, no splenomegaly Musculoskeletal: nontender, no swelling, no obvious deformity Extremities: no edema, no cyanosis, no clubbing. Left leg with non-tender enlarged varicose vein to medial thigh Pulses: 2+ symmetric, upper and lower extremities, normal cap refill Neurological: alert, oriented x 3, CN2-12 intact, strength normal upper extremities and lower extremities, sensation normal throughout, DTRs 2+ throughout, no cerebellar signs, gait normal Psychiatric: normal affect, behavior normal, pleasant  Skin: warm, dry, intact; no lesions or rashes   Medicare Attestation I have personally reviewed: The patient's medical and social history Their use of alcohol, tobacco or illicit drugs Their current medications and supplements The patient's functional ability including ADLs,fall risks, home safety risks, cognitive, and hearing and visual impairment Diet and physical activities Evidence for depression or mood disorders  The patient's weight, height, BMI, and visual acuity have been recorded in the chart.  I have made referrals, counseling, and provided education to the patient based on review of the above and I have provided the patient with a written personalized care plan for preventive services.     Izora Ribas, NP   09/08/2020

## 2020-09-08 ENCOUNTER — Encounter: Payer: Self-pay | Admitting: Adult Health

## 2020-09-08 ENCOUNTER — Other Ambulatory Visit: Payer: Self-pay

## 2020-09-08 ENCOUNTER — Ambulatory Visit (INDEPENDENT_AMBULATORY_CARE_PROVIDER_SITE_OTHER): Payer: PPO | Admitting: Adult Health

## 2020-09-08 VITALS — BP 106/68 | HR 68 | Temp 97.3°F | Wt 143.0 lb

## 2020-09-08 DIAGNOSIS — E663 Overweight: Secondary | ICD-10-CM

## 2020-09-08 DIAGNOSIS — N9489 Other specified conditions associated with female genital organs and menstrual cycle: Secondary | ICD-10-CM

## 2020-09-08 DIAGNOSIS — E538 Deficiency of other specified B group vitamins: Secondary | ICD-10-CM | POA: Diagnosis not present

## 2020-09-08 DIAGNOSIS — E785 Hyperlipidemia, unspecified: Secondary | ICD-10-CM | POA: Diagnosis not present

## 2020-09-08 DIAGNOSIS — J302 Other seasonal allergic rhinitis: Secondary | ICD-10-CM | POA: Diagnosis not present

## 2020-09-08 DIAGNOSIS — I10 Essential (primary) hypertension: Secondary | ICD-10-CM | POA: Diagnosis not present

## 2020-09-08 DIAGNOSIS — M810 Age-related osteoporosis without current pathological fracture: Secondary | ICD-10-CM

## 2020-09-08 DIAGNOSIS — Z79899 Other long term (current) drug therapy: Secondary | ICD-10-CM

## 2020-09-08 DIAGNOSIS — E559 Vitamin D deficiency, unspecified: Secondary | ICD-10-CM

## 2020-09-08 DIAGNOSIS — M1711 Unilateral primary osteoarthritis, right knee: Secondary | ICD-10-CM

## 2020-09-08 DIAGNOSIS — R6889 Other general symptoms and signs: Secondary | ICD-10-CM

## 2020-09-08 DIAGNOSIS — K5790 Diverticulosis of intestine, part unspecified, without perforation or abscess without bleeding: Secondary | ICD-10-CM

## 2020-09-08 DIAGNOSIS — Z0001 Encounter for general adult medical examination with abnormal findings: Secondary | ICD-10-CM | POA: Diagnosis not present

## 2020-09-08 DIAGNOSIS — I771 Stricture of artery: Secondary | ICD-10-CM

## 2020-09-08 DIAGNOSIS — Z0000 Encounter for general adult medical examination without abnormal findings: Secondary | ICD-10-CM

## 2020-09-08 NOTE — Patient Instructions (Signed)
Check with insurance about shingrix vaccine -  Can get at [facility] or [facility]    Ezetimibe Tablets What is this medication? EZETIMIBE (ez ET i mibe) treats high cholesterol. It works by reducing the amount of cholesterol absorbed from the food you eat. This decreases the amount of bad cholesterol (such as LDL) in your blood. Changes to diet and exerciseare often combined with this medication. This medicine may be used for other purposes; ask your health care provider orpharmacist if you have questions. COMMON BRAND NAME(S): Zetia What should I tell my care team before I take this medication? They need to know if you have any of these conditions: Kidney disease Liver disease Muscle cramps, pain Muscle injury Thyroid disease An unusual or allergic reaction to ezetimibe, other medications, foods, dyes, or preservatives Pregnant or trying to get pregnant Breast-feeding How should I use this medication? Take this medication by mouth. Take it as directed on the prescription label at the same time every day. You can take it with or without food. If it upsets your stomach, take it with food. Keep taking it unless your care team tells youto stop. Take bile acid sequestrants at a different time of day than this medication.Take this medication 2 hours BEFORE or 4 hours AFTER bile acid sequestrants. Talk to your care team about the use of this medication in children. While it may be prescribed for children as young as 10 for selected conditions,precautions do apply. Overdosage: If you think you have taken too much of this medicine contact apoison control center or emergency room at once. NOTE: This medicine is only for you. Do not share this medicine with others. What if I miss a dose? If you miss a dose, take it as soon as you can. If it is almost time for yournext dose, take only that dose. Do not take double or extra doses. What may interact with this medication? Do not take this medication with any  of the following: Fenofibrate Gemfibrozil This medication may also interact with the following: Antacids Cyclosporine Herbal medications like red yeast rice Other medications to lower cholesterol or triglycerides This list may not describe all possible interactions. Give your health care provider a list of all the medicines, herbs, non-prescription drugs, or dietary supplements you use. Also tell them if you smoke, drink alcohol, or use illegaldrugs. Some items may interact with your medicine. What should I watch for while using this medication? Visit your care team for regular checks on your progress. Tell your care teamif your symptoms do not start to get better or if they get worse. Your care team may tell you to stop taking this medication if you develop muscle problems. If your muscle problems do not go away after stopping thismedication, contact your care team. Do not become pregnant while taking this medication. Women should inform their care team if they wish to become pregnant or think they might be pregnant. There is potential for serious harm to an unborn child. Talk to your care teamfor more information. Do not breast-feed an infant while taking this medication. Taking this medication is only part of a total heart healthy program. Your care team may give you a special diet to follow. Avoid alcohol. Avoid smoking. Askyour care team how much you should exercise. What side effects may I notice from receiving this medication? Side effects that you should report to your doctor or health care provider assoon as possible: Allergic reactions-skin rash, itching or hives, swelling of the face, lips,  tongue, or throat Side effects that usually do not require medical attention (report to yourdoctor or health care provider if they continue or are bothersome): Diarrhea Joint pain This list may not describe all possible side effects. Call your doctor for medical advice about side effects. You may  report side effects to FDA at1-800-FDA-1088. Where should I keep my medication? Keep out of the reach of children and pets. Store at room temperature between 15 and 30 degrees C (59 and 86 degrees F). Protect from moisture. Get rid of any unused medication after the expirationdate. NOTE: This sheet is a summary. It may not cover all possible information. If you have questions about this medicine, talk to your doctor, pharmacist, orhealth care provider.  2022 Elsevier/Gold Standard (2020-04-10 12:29:13)

## 2020-09-09 ENCOUNTER — Other Ambulatory Visit: Payer: Self-pay | Admitting: Adult Health

## 2020-09-09 LAB — LIPID PANEL
Cholesterol: 276 mg/dL — ABNORMAL HIGH (ref <200)
HDL: 74 mg/dL (ref >=50)
LDL Cholesterol (Calc): 163 mg/dL (calc) — ABNORMAL HIGH
Non-HDL Cholesterol (Calc): 202 mg/dL (calc) — ABNORMAL HIGH (ref <130)
Total CHOL/HDL Ratio: 3.7 (calc) (ref <5.0)
Triglycerides: 218 mg/dL — ABNORMAL HIGH (ref <150)

## 2020-09-09 LAB — CBC WITH DIFFERENTIAL/PLATELET
Absolute Monocytes: 963 cells/uL — ABNORMAL HIGH (ref 200–950)
Basophils Absolute: 54 cells/uL (ref 0–200)
Basophils Relative: 0.6 %
Eosinophils Absolute: 135 cells/uL (ref 15–500)
Eosinophils Relative: 1.5 %
HCT: 36.2 % (ref 35.0–45.0)
Hemoglobin: 12 g/dL (ref 11.7–15.5)
Lymphs Abs: 2997 cells/uL (ref 850–3900)
MCH: 31 pg (ref 27.0–33.0)
MCHC: 33.1 g/dL (ref 32.0–36.0)
MCV: 93.5 fL (ref 80.0–100.0)
MPV: 8.9 fL (ref 7.5–12.5)
Monocytes Relative: 10.7 %
Neutro Abs: 4851 cells/uL (ref 1500–7800)
Neutrophils Relative %: 53.9 %
Platelets: 311 10*3/uL (ref 140–400)
RBC: 3.87 10*6/uL (ref 3.80–5.10)
RDW: 13.6 % (ref 11.0–15.0)
Total Lymphocyte: 33.3 %
WBC: 9 10*3/uL (ref 3.8–10.8)

## 2020-09-09 LAB — COMPLETE METABOLIC PANEL WITH GFR
AG Ratio: 1.6 (calc) (ref 1.0–2.5)
ALT: 26 U/L (ref 6–29)
AST: 18 U/L (ref 10–35)
Albumin: 4.4 g/dL (ref 3.6–5.1)
Alkaline phosphatase (APISO): 57 U/L (ref 37–153)
BUN: 13 mg/dL (ref 7–25)
CO2: 26 mmol/L (ref 20–32)
Calcium: 10.2 mg/dL (ref 8.6–10.4)
Chloride: 97 mmol/L — ABNORMAL LOW (ref 98–110)
Creat: 0.93 mg/dL (ref 0.60–0.93)
GFR, Est African American: 68 mL/min/{1.73_m2} (ref >=60)
GFR, Est Non African American: 58 mL/min/{1.73_m2} — ABNORMAL LOW (ref >=60)
Globulin: 2.7 g/dL (calc) (ref 1.9–3.7)
Glucose, Bld: 92 mg/dL (ref 65–99)
Potassium: 3.9 mmol/L (ref 3.5–5.3)
Sodium: 134 mmol/L — ABNORMAL LOW (ref 135–146)
Total Bilirubin: 0.6 mg/dL (ref 0.2–1.2)
Total Protein: 7.1 g/dL (ref 6.1–8.1)

## 2020-09-09 LAB — MAGNESIUM: Magnesium: 2.1 mg/dL (ref 1.5–2.5)

## 2020-09-09 LAB — VITAMIN D 25 HYDROXY (VIT D DEFICIENCY, FRACTURES): Vit D, 25-Hydroxy: 65 ng/mL (ref 30–100)

## 2020-09-09 LAB — TSH: TSH: 1.68 mIU/L (ref 0.40–4.50)

## 2020-09-09 LAB — VITAMIN B12: Vitamin B-12: 353 pg/mL (ref 200–1100)

## 2020-09-09 MED ORDER — EZETIMIBE 10 MG PO TABS: ORAL_TABLET | ORAL | 3 refills | Status: DC

## 2020-09-29 DIAGNOSIS — M170 Bilateral primary osteoarthritis of knee: Secondary | ICD-10-CM | POA: Diagnosis not present

## 2020-09-30 ENCOUNTER — Ambulatory Visit: Payer: PPO

## 2020-10-01 ENCOUNTER — Other Ambulatory Visit: Payer: Self-pay

## 2020-10-01 ENCOUNTER — Ambulatory Visit (INDEPENDENT_AMBULATORY_CARE_PROVIDER_SITE_OTHER): Payer: PPO

## 2020-10-01 VITALS — BP 142/78 | HR 58 | Temp 97.3°F | Wt 146.0 lb

## 2020-10-01 DIAGNOSIS — Z79899 Other long term (current) drug therapy: Secondary | ICD-10-CM | POA: Diagnosis not present

## 2020-10-01 NOTE — Progress Notes (Signed)
Patient presents to the office for a nurse visit to have labs done to check a BMP/GFR. Upon review of medications, patient states the following: Zetia-10mg  am, Atenolol-50mg  am, Olmesartan-20mg  pm, Singulair, Curcumin, Aspirin 81mg , Vitamin D & B12. Has stopped taking Furosemide. Patient has been monitoring for swelling and blood pressure closely and is reducing salt intake as advised.  Has increased her water intake as advised. Vitals taken and recorded.

## 2020-10-02 LAB — BASIC METABOLIC PANEL WITH GFR
BUN: 13 mg/dL (ref 7–25)
CO2: 25 mmol/L (ref 20–32)
Calcium: 9.7 mg/dL (ref 8.6–10.4)
Chloride: 98 mmol/L (ref 98–110)
Creat: 0.76 mg/dL (ref 0.60–0.93)
GFR, Est African American: 86 mL/min/{1.73_m2} (ref >=60)
GFR, Est Non African American: 75 mL/min/{1.73_m2} (ref >=60)
Glucose, Bld: 107 mg/dL — ABNORMAL HIGH (ref 65–99)
Potassium: 4.3 mmol/L (ref 3.5–5.3)
Sodium: 133 mmol/L — ABNORMAL LOW (ref 135–146)

## 2020-10-14 DIAGNOSIS — Z719 Counseling, unspecified: Secondary | ICD-10-CM

## 2020-11-04 ENCOUNTER — Other Ambulatory Visit: Payer: Self-pay | Admitting: Internal Medicine

## 2020-11-04 DIAGNOSIS — I10 Essential (primary) hypertension: Secondary | ICD-10-CM

## 2020-11-09 ENCOUNTER — Ambulatory Visit (INDEPENDENT_AMBULATORY_CARE_PROVIDER_SITE_OTHER): Payer: PPO | Admitting: Internal Medicine

## 2020-11-09 ENCOUNTER — Other Ambulatory Visit: Payer: Self-pay

## 2020-11-09 ENCOUNTER — Encounter: Payer: Self-pay | Admitting: Internal Medicine

## 2020-11-09 VITALS — BP 138/70 | HR 66 | Temp 97.5°F | Resp 18 | Ht 62.0 in | Wt 143.8 lb

## 2020-11-09 DIAGNOSIS — J4521 Mild intermittent asthma with (acute) exacerbation: Secondary | ICD-10-CM

## 2020-11-09 MED ORDER — BENZONATATE 200 MG PO CAPS: ORAL_CAPSULE | ORAL | 1 refills | Status: DC

## 2020-11-09 MED ORDER — PROMETHAZINE-DM 6.25-15 MG/5ML PO SYRP: ORAL_SOLUTION | ORAL | 1 refills | Status: DC

## 2020-11-09 MED ORDER — DEXAMETHASONE 4 MG PO TABS: ORAL_TABLET | ORAL | 0 refills | Status: DC

## 2020-11-09 NOTE — Progress Notes (Addendum)
Future Appointments  Date Time Provider [place]  02/15/2021  9:00 AM Liane Comber, NP [facility] None  09/08/2021  2:30 PM Liane Comber, NP [facility] None    History of Present Illness:     Patient is a very nice 80 yo WWF presenting with a 4-5 day prodrome of non-productive couch. Denies fever, chills, rash, dyspnea, sputum or sweats.   Medications    atenolol (TENORMIN) 50 MG tablet, TAKE ONE TABLET DAILY BLOOD PRESSURE   olmesartan (BENICAR) 40 MG tablet, Take  1 tablet  Daily  for BP   ezetimibe (ZETIA) 10 MG tablet, Take 1 tab daily for cholesterol. (Patient not taking: Reported on 11/09/2020)   furosemide (LASIX) 40 MG tablet, TAKE ONE TABLET EACH DAY FOR BLOOD PRESSURE AND FLUID RETENTION/ANKLE SWELLING (Patient not taking: Reported on 11/09/2020)   montelukast (SINGULAIR) 10 MG tablet, TAKE ONE TABLET DAILY FOR ALLERGY   PROAIR HFA 108 (90 Base) MCG/ACT inhaler, INHALE 2 PUFFS INTO THE LUNGS EVERY 6 HOURS AS NEEDED FOR WHEEZING OR SHORTNESS OF BREATH   aspirin EC 81 MG tablet, Take 81 mg by mouth daily.   celecoxib (CELEBREX) 200 MG capsule, celecoxib 200 mg capsule   Cyanocobalamin (B-12) 1000 MCG SUBL, Place 1 tablet under the tongue daily. (Patient taking differently: Place 1 tablet under the tongue daily. Takes liquid form)   Cholecalciferol (VITAMIN D PO), Take 5,000 Int'l Units by mouth daily.   MAGNESIUM PO, Take 500 mg by mouth daily. Magesium Oxide   TURMERIC PO, Take 800 mg by mouth 2 (two) times daily. 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   Observations/Objective:  BP 138/70    Pulse 66   Temp (!) 97.5 F (36.4 C)   Resp 18   Ht '5\' 2"'$  (1.575 m)   Wt 143 lb 12.8 oz (65.2 kg)   SpO2 96%   BMI 26.30 kg/m   Repeat O2 sat = 99%   Tracheal dry cough  HEENT - WNL. Neck - supple.  Chest - postussive forced expiratory wheezing.  Cor - Nl HS. RRR w/o sig M MS- FROM w/o deformities.  Gait Nl. Neuro -  Nl w/o focal abnormalities.  Assessment and Plan:   1. 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 The patient was provided an opportunity to ask questions and all were answered. The patient agreed with the plan and demonstrated an understanding of the instructions.       The patient was advised to call back or seek  an in-person evaluation if the symptoms worsen or if the condition fails to improve as anticipated.    Kirtland Bouchard, MD

## 2020-11-11 DIAGNOSIS — M1711 Unilateral primary osteoarthritis, right knee: Secondary | ICD-10-CM | POA: Diagnosis not present

## 2020-11-26 ENCOUNTER — Encounter: Payer: Self-pay | Admitting: Internal Medicine

## 2020-11-26 ENCOUNTER — Ambulatory Visit (INDEPENDENT_AMBULATORY_CARE_PROVIDER_SITE_OTHER): Payer: PPO | Admitting: Internal Medicine

## 2020-11-26 ENCOUNTER — Other Ambulatory Visit: Payer: Self-pay

## 2020-11-26 VITALS — BP 148/80 | HR 71 | Temp 97.3°F | Resp 18 | Ht 62.0 in | Wt 142.0 lb

## 2020-11-26 DIAGNOSIS — J4521 Mild intermittent asthma with (acute) exacerbation: Secondary | ICD-10-CM

## 2020-11-26 MED ORDER — DEXAMETHASONE 4 MG PO TABS: ORAL_TABLET | ORAL | 0 refills | Status: DC

## 2020-11-26 MED ORDER — TRELEGY ELLIPTA 200-62.5-25 MCG/INH IN AEPB: INHALATION_SPRAY | RESPIRATORY_TRACT | 3 refills | Status: DC

## 2020-11-26 NOTE — Progress Notes (Signed)
Future Appointments  Date Time Provider [place]  02/15/2021  9:00 AM Liane Comber, NP [facility] None  09/08/2021  2:30 PM Liane Comber, NP [facility] None    History of Present Illness:     Pasient is a very nice 80 yo DWF treated 2 weeks ago for Asthmatic Bronchitis (presumed viral) with Breztri MDI and a Decadron taper. Patient reported sx's initially improved but now c/o persistent head/chest congestion and wheezing . Has dry cough . No significant sputum. Denies dyspnea. No rash .   Medications    atenolol (TENORMIN) 50 MG tablet, TAKE ONE TABLET DAILY BLOOD PRESSURE   olmesartan (BENICAR) 40 MG tablet, Take  1 tablet  Daily  for BP   ezetimibe (ZETIA) 10 MG tablet, Take 1 tab daily for cholesterol. (Patient not taking: No sig reported)   furosemide (LASIX) 40 MG tablet, TAKE ONE TABLET EACH DAY FOR BLOOD PRESSURE AND FLUID RETENTION/ANKLE SWELLING (Patient not taking: No sig reported)   montelukast (SINGULAIR) 10 MG tablet, TAKE ONE TABLET DAILY FOR ALLERGY   PROAIR HFA 108 (90 Base) MCG/ACT inhaler, INHALE 2 PUFFS INTO THE LUNGS EVERY 6 HOURS AS NEEDED FOR WHEEZING OR SHORTNESS OF BREATH   aspirin EC 81 MG tablet, Take 81 mg by mouth daily.   celecoxib (CELEBREX) 200 MG capsule, celecoxib 200 mg capsule  Current Outpatient Medications (Hematological):    Cyanocobalamin (B-12) 1000 MCG SUBL, Place 1 tablet under the tongue daily. (Patient taking differently: Place 1 tablet under the tongue daily. Takes liquid form)   Cholecalciferol (VITAMIN D PO), Take 5,000 Int'l Units by mouth daily.   fluorouracil (EFUDEX) 5 % cream, fluorouracil 5 % topical cream   MAGNESIUM PO, Take 500 mg by mouth daily. Magesium Oxide   TURMERIC PO, Take 800 mg by mouth 2 (two) times daily. 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   Observations/Objective:  BP (!) 148/80   Pulse 71   Temp (!) 97.3 F (36.3 C)   Resp 18   Ht '5\' 2"'$  (1.575 m)   Wt 142 lb (64.4 kg)   SpO2 99%   BMI 25.97 kg/m   Dry cough. No stridor. No rash, cyanosis.   HEENT - WNL. Neck - supple.  Chest -  Few scattered post tussive forced post-tussive expiratory wheezes.  Cor - Nl HS. RRR w/o sig MGR. PP 1(+). No edema. MS- FROM w/o deformities.  Gait Nl. Neuro -  Nl w/o focal abnormalities.  Assessment and Plan:  1. Mild intermittent asthma with acute exacerbation  - dexamethasone  4 MG tablet; Take 1 tab 3 x day - 3 days, then 2 x day - 3 days,  then 1 tab daily  Dispense: 20 tablet  -TRELEGY ELLIPT 200-62.5-25 MCG/INH ;  Use  1 inhalation  Daily   Dispense: 60 each; Refill: 3   Follow Up Instructions:       I discussed the assessment and treatment plan with the patient. The patient was provided an opportunity to ask questions and all were answered. The patient agreed with the plan and demonstrated an understanding of the instructions.       The patient was advised to call back or seek an in-person evaluation if the symptoms worsen or if the condition fails to improve as anticipated.   Lendon Ka  Melford Aase, MD

## 2020-11-29 ENCOUNTER — Encounter: Payer: Self-pay | Admitting: Internal Medicine

## 2020-12-09 DIAGNOSIS — L821 Other seborrheic keratosis: Secondary | ICD-10-CM | POA: Diagnosis not present

## 2020-12-09 DIAGNOSIS — L570 Actinic keratosis: Secondary | ICD-10-CM | POA: Diagnosis not present

## 2020-12-22 DIAGNOSIS — Z719 Counseling, unspecified: Secondary | ICD-10-CM

## 2021-01-07 ENCOUNTER — Other Ambulatory Visit: Payer: Self-pay

## 2021-01-07 DIAGNOSIS — J4521 Mild intermittent asthma with (acute) exacerbation: Secondary | ICD-10-CM

## 2021-01-07 MED ORDER — TRELEGY ELLIPTA 200-62.5-25 MCG/INH IN AEPB: INHALATION_SPRAY | RESPIRATORY_TRACT | 3 refills | Status: DC

## 2021-01-28 ENCOUNTER — Ambulatory Visit (INDEPENDENT_AMBULATORY_CARE_PROVIDER_SITE_OTHER): Payer: PPO | Admitting: Internal Medicine

## 2021-01-28 ENCOUNTER — Other Ambulatory Visit: Payer: Self-pay

## 2021-01-28 VITALS — BP 153/90 | HR 66 | Temp 97.8°F | Resp 16 | Ht 62.0 in | Wt 144.8 lb

## 2021-01-28 DIAGNOSIS — Z23 Encounter for immunization: Secondary | ICD-10-CM

## 2021-01-28 DIAGNOSIS — I10 Essential (primary) hypertension: Secondary | ICD-10-CM | POA: Diagnosis not present

## 2021-01-28 NOTE — Progress Notes (Signed)
Future Appointments  Date Time Provider [place]  01/28/2021 10:30 AM Unk Pinto, MD [facility] None  03/15/2021     CPE 10:00 AM Unk Pinto, MD [facility] None  09/08/2021    Wellness  2:30 PM Liane Comber, NP [facility] None    History of Present Illness:     Patient is a very nice 80 yo WWF with hx/o Covid  5 month ago in May 2022. Patient's sx's resolves but she's still c/o fatigability. No significant respiratory sx's. Medications  Current Outpatient Medications (Endocrine & Metabolic):    dexamethasone (DECADRON) 4 MG tablet, Take 1 tab 3 x day - 3 days, then 2 x day - 3 days, then 1 tab daily (Patient not taking: Reported on 01/28/2021)  Current Outpatient Medications (Cardiovascular):    atenolol (TENORMIN) 50 MG tablet, TAKE ONE TABLET DAILY BLOOD PRESSURE   olmesartan (BENICAR) 40 MG tablet, Take  1 tablet  Daily  for BP   ezetimibe (ZETIA) 10 MG tablet, Take 1 tab daily for cholesterol. (Patient not taking: No sig reported)   furosemide (LASIX) 40 MG tablet, TAKE ONE TABLET EACH DAY FOR BLOOD PRESSURE AND FLUID RETENTION/ANKLE SWELLING (Patient not taking: No sig reported)  Current Outpatient Medications (Respiratory):    Fluticasone-Umeclidin-Vilant (TRELEGY ELLIPTA) 200-62.5-25 MCG/INH AEPB, Use  1 inhalation  Daily   montelukast (SINGULAIR) 10 MG tablet, TAKE ONE TABLET DAILY FOR ALLERGY   PROAIR HFA 108 (90 Base) MCG/ACT inhaler, INHALE 2 PUFFS INTO THE LUNGS EVERY 6 HOURS AS NEEDED FOR WHEEZING OR SHORTNESS OF BREATH   benzonatate (TESSALON) 200 MG capsule, Take 1 perle 3 x / day to prevent cough (Patient not taking: No sig reported)   promethazine-dextromethorphan (PROMETHAZINE-DM) 6.25-15 MG/5ML syrup, Take 1 tsp every 4 hours if needed for cough (Patient not taking: No sig reported)  Current Outpatient Medications (Analgesics):    aspirin EC 81 MG tablet, Take 81 mg by mouth daily.   celecoxib (CELEBREX) 200 MG capsule, celecoxib 200 mg  capsule  Current Outpatient Medications (Hematological):    Cyanocobalamin (B-12) 1000 MCG SUBL, Place 1 tablet under the tongue daily. (Patient taking differently: Place 1 tablet under the tongue daily. Takes liquid form)  Current Outpatient Medications (Other):    Cholecalciferol (VITAMIN D PO), Take 5,000 Int'l Units by mouth daily.   MAGNESIUM PO, Take 500 mg by mouth daily. Magesium Oxide   TURMERIC PO, Take 800 mg by mouth 2 (two) times daily. 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   Observations/Objective:  BP (!) 153/90   Pulse 66   Temp 97.8 F (36.6 C)   Resp 16   Ht 5\' 2"  (1.575 m)   Wt 144 lb 12.8 oz (65.7 kg)   SpO2 99%   BMI 26.48   Recheck BP x 2 = `160/90 at Rt wrist.   HEENT - WNL. Neck - supple.  Chest - Clear equal BS. Cor - Nl HS. RRR w/o sig MGR. PP 1(+). No edema. MS- FROM w/o deformities.  Gait Nl. Neuro -  Nl w/o focal abnormalities.   Assessment and Plan:  1. Essential hypertension  - encouraged bid monitoring of BP  and call if greater than 145/   2.  Need for immunization against influenza  - Flu vaccine HIGH DOSE PF (Fluzone High dose)   Follow Up Instructions:        I discussed the assessment and treatment plan with the patient. The patient was provided an opportunity to ask questions and all were answered. The patient agreed with the plan and demonstrated an understanding of the instructions.       The patient was advised to call back or seek an in-person evaluation if the symptoms worsen or if the condition fails to improve as anticipated.    Kirtland Bouchard,  MD

## 2021-01-30 ENCOUNTER — Encounter: Payer: Self-pay | Admitting: Internal Medicine

## 2021-02-15 ENCOUNTER — Encounter: Payer: PPO | Admitting: Adult Health

## 2021-02-24 ENCOUNTER — Telehealth: Payer: Self-pay | Admitting: Internal Medicine

## 2021-02-24 NOTE — Chronic Care Management (AMB) (Signed)
  Chronic Care Management   Outreach Note  02/24/2021 Name: Michaela Kelley MRN: [id number] DOB: 1941/01/12  Referred by: Unk Pinto, MD Reason for referral : No chief complaint on file.   An unsuccessful telephone outreach was attempted today. The patient was referred to the pharmacist for assistance with care management and care coordination.   Follow Up Plan:   Tatjana Dellinger Upstream Scheduler

## 2021-02-24 NOTE — Progress Notes (Signed)
  Chronic Care Management   Note  02/24/2021 Name: Michaela Kelley MRN: [id number] DOB: [birth year]  Michaela Kelley is a 80 y.o. year old female who is a primary care patient of Unk Pinto, MD. I reached out to Anson Fret by phone today in response to a referral sent by [name] PCP, Unk Pinto, MD.   [name] was given information about Chronic Care Management services today including:  CCM service includes personalized support from designated clinical staff supervised by her physician, including individualized plan of care and coordination with other care providers 24/7 contact phone numbers for assistance for urgent and routine care needs. Service will only be billed when office clinical staff spend 20 minutes or more in a month to coordinate care. Only one practitioner may furnish and bill the service in a calendar month. The patient may stop CCM services at any time (effective at the end of the month) by phone call to the office staff.   Patient agreed to services and verbal consent obtained.   Follow up plan:   Tatjana Secretary/administrator

## 2021-03-14 ENCOUNTER — Encounter: Payer: Self-pay | Admitting: Internal Medicine

## 2021-03-14 NOTE — Patient Instructions (Signed)

## 2021-03-14 NOTE — Progress Notes (Signed)
Annual Screening/Preventative Visit & Comprehensive Evaluation &  Examination  Future Appointments  Date Time Provider Department  03/15/2021 10:00 AM Unk Pinto, MD [facility]  04/08/2021  9:00 AM Newton Pigg, [facility]  09/08/2021  2:30 PM Liane Comber, NP [facility]        This very nice 80 y.o. DWF presents for a Screening /Preventative Visit & comprehensive evaluation and management of multiple medical co-morbidities.  Patient has been followed for HTN, HLD, Prediabetes  and Vitamin D Deficiency.        HTN predates since  2003. Patient's BP has been controlled at home and patient denies any cardiac symptoms as chest pain, palpitations, shortness of breath, dizziness or ankle swelling. Today's BP is elevated at 154/88 and confirmed.         Patient' is statin Intolerant  & hyperlipidemia is not controlled with diet . Last lipids were not at goal :    Lab Results  Component Value Date   CHOL 276 (H) 09/08/2020   HDL 74 09/08/2020   LDLCALC 163 (H) 09/08/2020   TRIG 218 (H) 09/08/2020   CHOLHDL 3.7 09/08/2020         Patient has hx/o abnormal glucose  and patient denies reactive hypoglycemic symptoms, visual blurring, diabetic polys or paresthesias. Last A1c was at goal :  Lab Results  Component Value Date   HGBA1C 5.4 02/13/2020         Finally, patient has history of Vitamin D Deficiency ("37/ 2009 ) and last Vitamin D was at goal:  Lab Results  Component Value Date   VD25OH 63 09/08/2020     Current Outpatient Medications on File Prior to Visit  Medication Sig   aspirin EC 81 MG tablet Take  daily.   atenolol  50 MG tablet TAKE ONE TABLET DAILY    celecoxib  200 MG capsule celecoxib 200 mg capsule   VITAMIN D 5,000 Units  Take daily.   B-12 1000 MCG SL Place 1 tablet under the tongue daily.   TRELEGY 200-62.5-25  Use  1 inhalation  Daily   MAGNESIUM 500 mg  Take daily   montelukast 10 MG tablet TAKE ONE TABLET DAILY FOR ALLERGY    olmesartan 40 MG tablet Take  1 tablet  Daily  for BP   PROAIR HFA  inhaler INHALE 2 PUFFS EVERY 6 HRS AS NEEDED    TURMERIC 800 mg  Take Daily       Allergies  Allergen Reactions   Atorvastatin Anaphylaxis    Myalgias Myalgias   Codeine Nausea And Vomiting and Other (See Comments)    Severe headaches (tolerates Dilaudid) Other reaction(s): Other (See Comments) Severe headaches (tolerates Dilaudid)   Alendronate Other (See Comments)    Severe arthrlalgias   Hydrocodone-Acetaminophen Nausea And Vomiting and Other (See Comments)    Severe headaches (tolerates Dilaudid)   Hydrocodone-Acetaminophen Nausea And Vomiting    Other reaction(s): Other (See Comments) Severe headaches (tolerates Dilaudid)   Oxycodone-Acetaminophen Nausea And Vomiting and Other (See Comments)    Severe headaches (tolerates Dilaudid)   Oxycodone-Acetaminophen Nausea And Vomiting    Other reaction(s): Other (See Comments) Severe headaches (tolerates Dilaudid)    Oxycodone-Aspirin Nausea And Vomiting and Other (See Comments)    Severe headaches (tolerates Dilaudid)   Oxycodone-Aspirin Nausea And Vomiting    Other reaction(s): Other (See Comments) Severe headaches (tolerates Dilaudid)     Past Medical History:  Diagnosis Date   Allergy    Arthritis    Cataract  GERD (gastroesophageal reflux disease)    Hx: UTI (urinary tract infection)    Hyperlipidemia    Hypertension    Prediabetes    Seasonal allergic rhinitis    Vitamin D deficiency      Health Maintenance  Topic Date Due   Hepatitis C Screening  Never done   Zoster Vaccines- Shingrix (1 of 2) Never done   COVID-19 Vaccine (4 - Booster for Pfizer series) 05/29/2020   TETANUS/TDAP  05/26/2021   Pneumonia Vaccine 75+ Years old  Completed   INFLUENZA VACCINE  Completed   DEXA SCAN  Completed   HPV VACCINES  Aged Out     Immunization History  Administered Date(s) Administered   Influenza, High Dose Seasonal PF 02/12/2019,  02/13/2020, 01/28/2021   PFIZER SARS-COV-2 Vaccination 04/16/2019, 05/07/2019, 04/03/2020   Pneumococcal Conjugate-13 05/05/2014   Pneumococcal Polysaccharide-23 06/28/2006   Td 05/27/2011     Last Colon - 2018 - Dr Hilarie Fredrickson - adenomatous polyp & recc a 5 year f/u.   Last MGM - 02/24/2020  - overdure   Past Surgical History:  Procedure Laterality Date   BACK SURGERY  1999   L5    DILATION AND CURETTAGE OF UTERUS     KNEE ARTHROPLASTY  2010   L knee   TONSILLECTOMY     TOTAL SHOULDER ARTHROPLASTY  02/10/2011   Procedure: TOTAL SHOULDER ARTHROPLASTY;  Surgeon: Metta Clines Supple;  Location: [place];  Service: Orthopedics;  Laterality: Left;  TOTAL SHOULDER ARTHROPLASTY LEFT SIDE   TOTAL SHOULDER ARTHROPLASTY  12/22/2011   Procedure: TOTAL SHOULDER ARTHROPLASTY;  Surgeon: Marin Shutter, MD;  Location: [place];  Service: Orthopedics;  Laterality: Right;  right total shoulder arthroplasty     Family History  Problem Relation Age of Onset   Breast cancer Mother 41   Hypertension Father    Heart disease Brother    Esophageal cancer Brother    Heart disease Maternal Grandmother    Heart disease Maternal Grandfather    Breast cancer Paternal Grandmother    Diabetes Paternal Grandfather    Colon cancer Neg Hx    Rectal cancer Neg Hx    Stomach cancer Neg Hx      Social History   Tobacco Use   Smoking status: Former    Packs/day: 0.25    Years: 20.00    Pack years: 5.00    Types: Cigarettes    Quit date: 12/16/1991    Years since quitting: 29.2   Smokeless tobacco: Never  Vaping Use   Vaping Use: Never used  Substance Use Topics   Alcohol use: Yes    Alcohol/week: 8.0 standard drinks    Types: 8 Glasses of wine per week    Comment: 6-8 glasses per week    Drug use: No      ROS Constitutional: Denies fever, chills, weight loss/gain, headaches, insomnia,  night sweats, and change in appetite. Does c/o fatigue. Eyes: Denies redness, blurred vision, diplopia, discharge,  itchy, watery eyes.  ENT: Denies discharge, congestion, post nasal drip, epistaxis, sore throat, earache, hearing loss, dental pain, Tinnitus, Vertigo, Sinus pain, snoring.  Cardio: Denies chest pain, palpitations, irregular heartbeat, syncope, dyspnea, diaphoresis, orthopnea, PND, claudication, edema Respiratory: denies cough, dyspnea, DOE, pleurisy, hoarseness, laryngitis, wheezing.  Gastrointestinal: Denies dysphagia, heartburn, reflux, water brash, pain, cramps, nausea, vomiting, bloating, diarrhea, constipation, hematemesis, melena, hematochezia, jaundice, hemorrhoids Genitourinary: Denies dysuria, frequency, urgency, nocturia, hesitancy, discharge, hematuria, flank pain Breast: Breast lumps, nipple discharge, bleeding.  Musculoskeletal: Denies arthralgia, myalgia,  stiffness, Jt. Swelling, pain, limp, and strain/sprain. Denies falls. Skin: Denies puritis, rash, hives, warts, acne, eczema, changing in skin lesion Neuro: No weakness, tremor, incoordination, spasms, paresthesia, pain Psychiatric: Denies confusion, memory loss, sensory loss. Denies Depression. Endocrine: Denies change in weight, skin, hair change, nocturia, and paresthesia, diabetic polys, visual blurring, hyper / hypo glycemic episodes.  Heme/Lymph: No excessive bleeding, bruising, enlarged lymph nodes.  Physical Exam  BP (!) 154/88    Pulse 72    Temp 97.9 F (36.6 C)    Resp 18    Ht 5\' 2"  (1.575 m)    Wt 140 lb 9.6 oz (63.8 kg)    SpO2 98%    BMI 25.72 kg/m   General Appearance: Well nourished, well groomed and in no apparent distress.  Eyes: PERRLA, EOMs, conjunctiva no swelling or erythema, normal fundi and vessels. Sinuses: No frontal/maxillary tenderness ENT/Mouth: EACs patent / TMs  nl. Nares clear without erythema, swelling, mucoid exudates. Oral hygiene is good. No erythema, swelling, or exudate. Tongue normal, non-obstructing. Tonsils not swollen or erythematous. Hearing normal.  Neck: Supple, thyroid not  palpable. No bruits, nodes or JVD. Respiratory: Respiratory effort normal.  BS equal and clear bilateral without rales, rhonci, wheezing or stridor. Cardio: Heart sounds are normal with regular rate and rhythm and no murmurs, rubs or gallops. Peripheral pulses are normal and equal bilaterally without edema. No aortic or femoral bruits. Chest: symmetric with normal excursions and percussion. Breasts: Symmetric, without lumps, nipple discharge, retractions, or fibrocystic changes.  Abdomen: Flat, soft with bowel sounds active. Nontender, no guarding, rebound, hernias, masses, or organomegaly.  Lymphatics: Non tender without lymphadenopathy.  Genitourinary:  Musculoskeletal: Full ROM all peripheral extremities, joint stability, 5/5 strength, and normal gait. Skin: Warm and dry without rashes, lesions, cyanosis, clubbing or  ecchymosis.  Neuro: Cranial nerves intact, reflexes equal bilaterally. Normal muscle tone, no cerebellar symptoms. Sensation intact.  Pysch: Alert and oriented X 3, normal affect, Insight and Judgment appropriate.    Assessment and Plan  1. Annual Preventative Screening Examination  2. Essential hypertension  - EKG 12-Lead - Urinalysis, Routine w reflex microscopic - Microalbumin / creatinine urine ratio - CBC with Differential/Platelet - COMPLETE METABOLIC PANEL WITH GFR - Magnesium - TSH  3. Hyperlipidemia, mixed  - EKG 12-Lead - Lipid panel  4. Abnormal glucose  - EKG 12-Lead - Hemoglobin A1c - Insulin, random  5. Vitamin D deficiency  - VITAMIN D 25 Hydroxy   6. Screening for ischemic heart disease  - EKG 12-Lead  7. FHx: heart disease  - EKG 12-Lead  8. Medication management  - Urinalysis, Routine w reflex microscopic - Microalbumin / creatinine urine ratio - COMPLETE METABOLIC PANEL WITH GFR - Magnesium - Lipid panel - TSH - Hemoglobin A1c - Insulin, random - VITAMIN D 25 Hydroxy  9. Screening for colorectal cancer - POC Hemoccult  Bld/Stl           Patient was counseled in prudent diet to achieve/maintain BMI less than 25 for weight control, BP monitoring, regular exercise and medications. Discussed med's effects and SE's. Screening labs and tests as requested with regular follow-up as recommended. Over 40 minutes of exam, counseling, chart review and high complex critical decision making was performed.   Kirtland Bouchard, MD

## 2021-03-15 ENCOUNTER — Other Ambulatory Visit: Payer: Self-pay

## 2021-03-15 ENCOUNTER — Ambulatory Visit (INDEPENDENT_AMBULATORY_CARE_PROVIDER_SITE_OTHER): Payer: PPO | Admitting: Internal Medicine

## 2021-03-15 ENCOUNTER — Encounter: Payer: Self-pay | Admitting: Internal Medicine

## 2021-03-15 VITALS — BP 154/88 | HR 72 | Temp 97.9°F | Resp 18 | Ht 62.0 in | Wt 140.6 lb

## 2021-03-15 DIAGNOSIS — Z1211 Encounter for screening for malignant neoplasm of colon: Secondary | ICD-10-CM

## 2021-03-15 DIAGNOSIS — Z8249 Family history of ischemic heart disease and other diseases of the circulatory system: Secondary | ICD-10-CM | POA: Diagnosis not present

## 2021-03-15 DIAGNOSIS — R7309 Other abnormal glucose: Secondary | ICD-10-CM | POA: Diagnosis not present

## 2021-03-15 DIAGNOSIS — E782 Mixed hyperlipidemia: Secondary | ICD-10-CM | POA: Diagnosis not present

## 2021-03-15 DIAGNOSIS — I10 Essential (primary) hypertension: Secondary | ICD-10-CM

## 2021-03-15 DIAGNOSIS — Z79899 Other long term (current) drug therapy: Secondary | ICD-10-CM | POA: Diagnosis not present

## 2021-03-15 DIAGNOSIS — Z0000 Encounter for general adult medical examination without abnormal findings: Secondary | ICD-10-CM

## 2021-03-15 DIAGNOSIS — Z136 Encounter for screening for cardiovascular disorders: Secondary | ICD-10-CM | POA: Diagnosis not present

## 2021-03-15 DIAGNOSIS — E559 Vitamin D deficiency, unspecified: Secondary | ICD-10-CM | POA: Diagnosis not present

## 2021-03-16 LAB — VITAMIN D 25 HYDROXY (VIT D DEFICIENCY, FRACTURES): Vit D, 25-Hydroxy: 77 ng/mL (ref 30–100)

## 2021-03-16 LAB — URINALYSIS, ROUTINE W REFLEX MICROSCOPIC
Bacteria, UA: NONE SEEN /HPF
Bilirubin Urine: NEGATIVE
Glucose, UA: NEGATIVE
Hyaline Cast: NONE SEEN /LPF
Ketones, ur: NEGATIVE
Leukocytes,Ua: NEGATIVE
Nitrite: NEGATIVE
Protein, ur: NEGATIVE
RBC / HPF: NONE SEEN /HPF (ref 0–2)
Specific Gravity, Urine: 1.012 (ref 1.001–1.035)
Squamous Epithelial / HPF: NONE SEEN /HPF (ref <=5)
WBC, UA: NONE SEEN /HPF (ref 0–5)
pH: 7 (ref 5.0–8.0)

## 2021-03-16 LAB — COMPLETE METABOLIC PANEL WITH GFR
AG Ratio: 1.8 (calc) (ref 1.0–2.5)
ALT: 26 U/L (ref 6–29)
AST: 21 U/L (ref 10–35)
Albumin: 4.4 g/dL (ref 3.6–5.1)
Alkaline phosphatase (APISO): 57 U/L (ref 37–153)
BUN: 10 mg/dL (ref 7–25)
CO2: 25 mmol/L (ref 20–32)
Calcium: 10.3 mg/dL (ref 8.6–10.4)
Chloride: 98 mmol/L (ref 98–110)
Creat: 0.86 mg/dL (ref 0.60–1.00)
Globulin: 2.5 g/dL (calc) (ref 1.9–3.7)
Glucose, Bld: 102 mg/dL — ABNORMAL HIGH (ref 65–99)
Potassium: 4.9 mmol/L (ref 3.5–5.3)
Sodium: 133 mmol/L — ABNORMAL LOW (ref 135–146)
Total Bilirubin: 0.6 mg/dL (ref 0.2–1.2)
Total Protein: 6.9 g/dL (ref 6.1–8.1)
eGFR: 69 mL/min/{1.73_m2} (ref >=60)

## 2021-03-16 LAB — INSULIN, RANDOM: Insulin: 9.3 u[IU]/mL

## 2021-03-16 LAB — CBC WITH DIFFERENTIAL/PLATELET
Absolute Monocytes: 678 cells/uL (ref 200–950)
Basophils Absolute: 51 cells/uL (ref 0–200)
Basophils Relative: 0.8 %
Eosinophils Absolute: 218 cells/uL (ref 15–500)
Eosinophils Relative: 3.4 %
HCT: 37.4 % (ref 35.0–45.0)
Hemoglobin: 12.3 g/dL (ref 11.7–15.5)
Lymphs Abs: 1875 cells/uL (ref 850–3900)
MCH: 30.8 pg (ref 27.0–33.0)
MCHC: 32.9 g/dL (ref 32.0–36.0)
MCV: 93.7 fL (ref 80.0–100.0)
MPV: 8.8 fL (ref 7.5–12.5)
Monocytes Relative: 10.6 %
Neutro Abs: 3578 cells/uL (ref 1500–7800)
Neutrophils Relative %: 55.9 %
Platelets: 342 10*3/uL (ref 140–400)
RBC: 3.99 10*6/uL (ref 3.80–5.10)
RDW: 12.3 % (ref 11.0–15.0)
Total Lymphocyte: 29.3 %
WBC: 6.4 10*3/uL (ref 3.8–10.8)

## 2021-03-16 LAB — LIPID PANEL
Cholesterol: 245 mg/dL — ABNORMAL HIGH (ref <200)
HDL: 77 mg/dL (ref >=50)
LDL Cholesterol (Calc): 134 mg/dL (calc) — ABNORMAL HIGH
Non-HDL Cholesterol (Calc): 168 mg/dL (calc) — ABNORMAL HIGH (ref <130)
Total CHOL/HDL Ratio: 3.2 (calc) (ref <5.0)
Triglycerides: 200 mg/dL — ABNORMAL HIGH (ref <150)

## 2021-03-16 LAB — HEMOGLOBIN A1C
Hgb A1c MFr Bld: 5.3 % of total Hgb (ref <5.7)
Mean Plasma Glucose: 105 mg/dL
eAG (mmol/L): 5.8 mmol/L

## 2021-03-16 LAB — MAGNESIUM: Magnesium: 1.9 mg/dL (ref 1.5–2.5)

## 2021-03-16 LAB — TSH: TSH: 2.08 mIU/L (ref 0.40–4.50)

## 2021-03-16 LAB — MICROSCOPIC MESSAGE

## 2021-03-16 NOTE — Progress Notes (Signed)
============================================================ °-   Test results slightly outside the reference range are not unusual. If there is anything important, I will review this with you,  otherwise it is considered normal test values.  If you have further questions,  please do not hesitate to contact me at the office or via My Chart.  ============================================================ ============================================================  -   -  Total  Chol =  245    - Elevated             (  Ideal  or  Goal is less than 180  !  )   - and   -  Bad / Dangerous LDL  Chol = 134   - also Elevated              (  Ideal  or  Goal is less than 70  !  )   - Both way too high risk for Heart Attack , Stroke   & Vascular Dementia .  So sent in Rx for low dose Crestor  (Rosuvastatin)   - Please schedule a 3 month OV about the                                  3rd to 4th week of March to recheck  Cholesterol   & also needs to have annual Physical scheduled in                                                3rd week of December with Michaela Kelley ============================================================ ============================================================  - A1c = 5.3%   - Normal  - No Diabetes   - Great   ! ============================================================ ============================================================  - Vitamin D= 77  - Excellent  ============================================================ ============================================================  -All Else - CBC - Kidneys - Electrolytes - Liver - Magnesium & Thyroid    - all  Normal / OK ============================================================

## 2021-03-23 ENCOUNTER — Other Ambulatory Visit: Payer: Self-pay | Admitting: Internal Medicine

## 2021-04-07 ENCOUNTER — Telehealth: Payer: Self-pay | Admitting: Pharmacist

## 2021-04-07 NOTE — Telephone Encounter (Signed)
Called pt. To confirm visit with CPP and go over pre-call questions. Left detailed message for pt. to return call and confirm visit. LM

## 2021-04-08 ENCOUNTER — Ambulatory Visit: Payer: PPO | Admitting: Pharmacist

## 2021-04-08 DIAGNOSIS — E559 Vitamin D deficiency, unspecified: Secondary | ICD-10-CM

## 2021-04-08 DIAGNOSIS — J302 Other seasonal allergic rhinitis: Secondary | ICD-10-CM

## 2021-04-08 DIAGNOSIS — E538 Deficiency of other specified B group vitamins: Secondary | ICD-10-CM

## 2021-04-08 DIAGNOSIS — M1711 Unilateral primary osteoarthritis, right knee: Secondary | ICD-10-CM

## 2021-04-08 DIAGNOSIS — I10 Essential (primary) hypertension: Secondary | ICD-10-CM

## 2021-04-08 DIAGNOSIS — E785 Hyperlipidemia, unspecified: Secondary | ICD-10-CM

## 2021-04-08 DIAGNOSIS — M810 Age-related osteoporosis without current pathological fracture: Secondary | ICD-10-CM

## 2021-04-08 DIAGNOSIS — Z79899 Other long term (current) drug therapy: Secondary | ICD-10-CM

## 2021-04-09 NOTE — Progress Notes (Signed)
Pharmacist Visit  Michaela, Kelley [id number] 81 years, Female  DOB: 1941/01/14  M: [phone] Care Team: [name], [name]  __________________________________________________  Clinical Summary Situation:: Michaela Kelley is a friendly 81 year old female who resents for CCM initial visit. Pt has a chief complaint of intermittent bronchitis secondary to [place] in May. She also feels like her BP medication is too strong, her BP has dropped as low as 86/64. Patient stopped taking zinc as it was causing stomach upset. Background:: BP: Pt blood pressure in office runs high in the 150s. Pt states that her BP is usually 120-mid 130s/ 80s. However in the past few days, her BP went as low as 88/64 nd 112/70. Allergic Rhinitis: Pt states right now her allergy symptoms are not too bad. She states she does not take Trelegy as it makes her too hyper and too jittery and she finds it difficult to sleep on days she uses the inhaler. She has not needed to use rescue inhaler. She also is not taking Montelukast regularly Assessment:: BP: uncontrolled due to not having enough at home BP readings to assess home BP readings Allergic Rhinitis: Controlled due to limiting trigger exposure Recommendations::  Patient was educated on benefits of zinc, encouraged patient to split 50mg  zinc tablet in half if having stomach upset BP: Encouraged patient to continue taking olmesartan 40mg  nightly and if BP is less than 130/80 and symptomatic do not take atenolol in the morning Educated pt on benefits of taking maintenance inhaler daily to prevent asthma exacerbations. Recommend a lower dose maintenance inhaler if pt unable to tolerate Trelegy Encouraged patient to take Montelukast daily for allergy prevention  Attestation Statement:: CCM Services:  This encounter meets complex CCM services and moderate to high medical decision making.  Prior to outreach and patient consent for Chronic Care Management, I referred this  patient for services after reviewing the nominated patient list or from a personal encounter with the patient.  I have personally reviewed this encounter including the documentation in this note and have collaborated with the care management provider regarding care management and care coordination activities to include development and update of the comprehensive care plan I am certifying that I agree with the content of this note and encounter as supervising physician.   Chronic Conditions Patient's Chronic Conditions: Hypertension (HTN), Osteoarthritis, Osteopenia or Osteoporosis, Hyperlipidemia/Dyslipidemia (HLD), Allergic rhinitis, Tortuous Aorta, Pelvic Congestion Syndrome, Vitamin D deficiency, Diverticulosis, B12 deficiency    Doctor and Hospital Visits Were there PCP Visits in last 6 months?: Yes Visit #1: 11/09/20-Dr. Melford Aase (PCP)-Pt. presented with a 4-5 day prodrome of non-productive couch. Denies fever, chills, rash, dyspnea, sputum or sweats. STARTED  Benzonatate 200 MG Take 1 perle 3 x / day to prevent cough Dexamethasone 4 MG Take 1 tab 3 x day - 3 days, then 2 x day - 3 days, then 1 tab daily Promethazine-DM 6.25-15 MG/5ML Take 1 tsp every 4 hours if needed for cough  Visit #2: 11/26/20-Dr. Melford Aase- Pt. presented for acute exacerbation of asthma. STARTED  Fluticasone-Umeclidin-Vilant 200-62.5-25 MCG/INH Use  1 inhalation qd   Visit #3: 01/28/21-Dr. McKeown-Pt. presented for hypertension f/u. No medication changes noted. Visit #4: 03/15/21-Dr. McKeown-Pt. presented  for a Screening /Preventative Visit &amp; comprehensive evaluation and management of multiple medical co-morbidities.  Patient has been followed for HTN, HLD, Prediabetes  and Vitamin D Deficiency. STOPPED  Benzonatate 200 MG Take 1 perle 3 x / day Dexamethasone 4 MG Take 1 tab 3 x day -  3 days, then 2 x day - 3 days, then 1 tab daily Ezetimibe 10 MG Take 1 tab daily Fluorouracil 5 % fluorouracil 5 % topical  cream Furosemide 40 MG 1 tab daily Promethazine-DM 6.25-15 MG/5ML Take 1 tsp every 4 hours prn  Were there Specialist Visits in last 6 months?: Yes Visit #1: 11/11/20-Kendall, Baruch Merl (Family Medicine)-No other information available. Visit #2: 12/09/20-Stinehelfer, Manuela Schwartz (Dermatology)- No other information available. Was there a Hospital Visit in last 30 days?: No Were there other Hospital Visits in last 6 months?: No  Medication Information Are there any Medication discrepancies?: Yes Details:  Fluticasone-Umeclidin-Vilant (TRELEGY ELLIPTA) 200-62.5-25 MCG/INH AEPB-60DS 01/07/21 Atenolol 50mg -90DS 11/04/20 B12 1024mcg-30DS 02/13/2020 Montelukast 10mg -90DS 01/14/2020 Proair hfa 164mcg/act inhaler 90DS 09/272021  Are there any Medication adherence gaps (beyond 5 days past due)?: Yes Details:  Fluticasone-Umeclidin-Vilant (TRELEGY ELLIPTA) 200-62.5-25 MCG/INH AEPB-01/07/21 (&gt;5 days gap) 60DS by [facility] Atenolol 50mg -11/04/20 (&gt;5 days gap) 90DS by [facility] B12 1058mcg-02/13/2020 (&gt;5 days gap) 30DS by [facility] Montelukast 10mg -01/14/2020 (&gt;5 days gap) 90DS by [facility] Proair hfa 125mcg/act inhaler-09/272021  (&gt;5 days gap) 90DS by [facility]  Medication adherence rates for the STAR rating drugs:  Atenolol 50mg -11/04/20-90DS Olmesartan 40mg -03/23/21-90DS  List Patient's current Care Gaps: No current Care Gaps identified  Disease Assessments Current BP: 154/88 Current HR: 72 taken on: 03/15/2021 Previous BP: 153/90 Previous HR: 66 taken on: 01/27/2021 Weight: 140 BMI: 25.72 Last GFR: 69 taken on: 03/15/2021 Why did the patient present?: CCM initial visit Marital status?: Widowed 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 She also plays golf and is very active throughout the week. Who does the patient spend their time with  and what do they do?: Pt has two daughters and grandson who live in [place] so she spends her time with them during weekends Lifestyle habits such as diet and exercise?: Diet: grapefruit oatmeal, whole wheat toast, cereal. Egg occasionally 2times a week. Try to stay away from sandwiches. For dinner, eats Broccoli, Brussel sprouts, and sweat potatoes. Pt has Ice cream ever other night, has a small bowl at night. She has been reading how not to die. She is trying to eat more walnuts and [place] nuts. Pt tries to eat an apple every day. Alcohol, tobacco, and illicit drug usage?: Alcohol: Drink Red wine a few glasses every other night. 10 per week What is the patient's sleep pattern?: No sleep issues, Other (with free form text) Details: Trelegy has been making patient feel How many hours per night does patient typically sleep?: 7.5-8.5 Patient pleased with health care they are receiving?: Yes Family, occupational, and living circumstances relevant to overall health?: Pt has a family history of Breast cancer in mother, Hypertension in father, Esophageal cancer and Heart disease in brother, Heart disease in Maternal Grandmother, Breast cancer in Paternal Grandmother and Diabetes in Paternal Grandfather. Factors that may affect medication adherence?: Medication side effects Name and location of Current pharmacy: [facility] - [place], [place] - [address] Current Rx insurance plan: HTA Are meds synced by current pharmacy?: No Are meds delivered by current pharmacy?: No - delivery not available Would patient benefit from direct intervention of clinical lead in dispensing process to optimize clinical outcomes?: Yes Are [facility] pharmacy services available where patient lives?: Yes Is patient disadvantaged to use [facility]?: No [facility] services reviewed with patient and patient wishes to change pharmacy?: No Select reason patient  declined to change pharmacies: Other Details:  Forgot to address with patient. Please discuss at next visit Does patient experience delays in picking up medications due to transportation concerns (getting to pharmacy)?: No Any additional demeanor/mood notes?: Michaela Kelley is a friendly 81 year old female who resents for CCM initial visit. Pt has a chief complaint of intermittent bronchitis secondary to [place] in May. Pt states she cannot use Trelegy as it makes her too hyper and too jittery.   Hypertension (HTN) Assess this condition today?: Yes Is patient able to obtain BP reading today?: No Goal: <130/80 mmHG Hypertension Stage: Stage 2 (SBP >140 or DBP > 90) Is Patient checking BP at home?: Yes Pt. home BP readings are ranging: 88/60 then increased to 112/70. 122/80, 135/80 How often does patient miss taking their blood pressure medications?: Pt sometimes skips atenolol if BP is very low Has patient experienced hypotension, dizziness, falls or bradycardia?: Yes Provide Details: 3-4 days ago, her BP was 88/60 and she felt her pulse was very high she skipped her atenolol that day and then after moving around, BP increased to 112/70 Check present secondary causes (below) for HTN: Drug induced (NSAID, steroids) BP RPM device: No We discussed: Limiting the amount of alcohol to 1 or 2 drinks per day., DASH diet:  following a diet emphasizing fruits and vegetables and low-fat dairy products along with whole grains, fish, poultry, and nuts. Reducing red meats and sugars., Targeting 150 minutes of aerobic activity per week, Reducing the amount of salt intake to 1500mg /per day., Recommend using a salt substitute to replace your salt if you need flavor., Proper Home BP Measurement Assessment:: Uncontrolled Drug: Atenolol 50mg  QD  Assessment: Appropriate, Query Effectiveness Drug: Olmesartan 40mg  QD  Additional Info: Patient drank 2 cups of coffee a few minutes prior to the visit, so patient was uncomfortable obtaining a BP reading Plan to Start:  Check BP every morining. If BP > 130/80 take atenolol. If less than 130/80 do not take atenolol. Continue taking Olmesartan 40mg  every night HC Follow up: HTN assessment in February. Please record a BP log of her recent readings for past 2 weeks Pharmacist Follow up: N/A  Hyperlipidemia/Dyslipidemia (HLD) Last Lipid panel on: 03/15/2021 TC (Goal<200): 245 LDL: 134 HDL (Goal>40): 77 TG (Goal<150): 200 ASCVD 10-year risk?is:: Intermediate (7.5%-20%) ASCVD Risk Score: 18.4% Assess this condition today?: Yes LDL Goal: <100 Has patient tried and failed any HLD Medications?: Yes Medications failed: atorvastatin atorvastatin: anaphylaxis Check present secondary causes (below) that can lead to increased cholesterol levels (multi-choice optional): Alcohol use, Beta blockers We discussed: How a diet high in plant sterols (fruits/vegetables/nuts/whole grains/legumes) may reduce your cholesterol., Encouraged increasing fiber to a daily intake of 10-25g/day, How to reduce cholesterol through diet/weight management and physical activity. Assessment:: Uncontrolled Drug: None Additional Info: Pt motivated to implement lifestyle changes to decrease cholesterol levels. Pt started eating walnuts, [place] nuts and trying to eat an apple a day HC Follow up: N/A Pharmacist Follow up: Reassess lipids in 3 months, if elevated consider adding PCSK9 for further lipid reduction  Allergies Assess this condition today?: Yes Are your symptoms currently controlled?: Yes What are your allergy triggers?: Pollen / ragweed, Dust Do you currently receive allergy shots?: No Have you received allergy shots in the past?: No What allergen avoidance measures are you taking?: Remaining indoors during elevated levels of allergen (i.e. pollen), Regular vacuuming, Other Details: Avoidance of synthetic fabrics Assessment:: Controlled Drug: Montelukast 10mg  QHS (pt was not taking regularly) Assessment: Appropriate,  Effective,  Safe, Accessible Drug: Trelegy inhale 1 puff QD (pt not taking regularly) Assessment: Appropriate, Query Effectiveness Drug: Proair inhaler inhale 2 puffs every 4 hours as needed (pt not taking) Assessment: Appropriate, Effective, Safe, Accessible Plan to Start: Start taking Montelukast daily for best results in preventing asthma related allergic attacks. If noticing side effects from Trelegy, may schedule visit with PCP to consider step-down therapy for maintenance inhaler HC Follow up: N/A Pharmacist Follow up: Assess adherence at next visit  Osteopenia or Osteoporosis Current T-score: Left Forearm Radius -3.6,   DualFemur Total Right -2.2,   DualFemur Total Mean -1.8 taken on: 02/24/2020 Previous T-score: Left Forearm Radius -2.8,   DualFemur Total Right -2.0,   DualFemur Total Mean -1.7 taken on: 07/05/2017 Current Vitamin D 25-OH: 77 taken on: 03/15/2021 Previous Vitamin D 25-OH: 65 taken on: 09/07/2020 Assess this condition today?: Yes Patient has: Osteoporosis In the past 12 months, have you fallen?: No Are there any stairs in or around the home?: No Is the home free of loose throw rugs in walkways, pet beds, electrical cords, etc.?: No Is there adequate lighting in your home to reduce the risk of falls?: Yes Dietary calcium intake: Cereal Risk Factors: Alcohol Use Bisphosphonate start: Started in 2019, D/c'd in 2022 due to arthralgias We discussed: Weight bearing exercises (walking, light weights, resistance training), Dietary calcium intake, Fall prevention Patient has tried and failed: Alendronate - reaction severe arthralgias Assessment:: Uncontrolled Drug: Vitamin D 2,000 IU QD Assessment: Appropriate, Effective, Safe, Accessible HC Follow up: Fall assessment in April Pharmacist Follow up: Next Dexa due 01/2022  Exercise, Diet and Non-Drug Coordination Needs Additional exercise counseling points. We discussed: targeting at least 150 minutes per week of  moderate-intensity aerobic exercise., incorporating flexibility, balance, and strength training exercises Additional diet counseling points. We discussed: key components of the DASH diet, limiting alcohol intake Discussed Non-Drug Care Coordination Needs: Yes Does Patient have Medication financial barriers?: No  Accountable Health Communities Health-Related Social Needs Screening Tool -  SDOH  ([email or web address])  What is your living situation today? (ref #1): I have a steady place to live Think about the place you live. Do you have problems with any of the following? (ref #2): None of the above Within the past 12 months, you worried that your food would run out before you got money to buy more (ref #3): Never true Within the past 12 months, the food you bought just didn't last and you didn't have money to get more (ref #4): Never true In the past 12 months, has lack of reliable transportation kept you from medical appointments, meetings, work or from getting things needed for daily living? (ref #5): No In the past 12 months, has the electric, gas, oil, or water company threatened to shut off services in your home? (ref #6): No How often does anyone, including family and friends, physically hurt you? (ref #7): Never (1) How often does anyone, including family and friends, insult or talk down to you? (ref #8): Never (1) How often does anyone, including friends and family, threaten you with harm? (ref #9): Never (1) How often does anyone, including family and friends, scream or curse at you? (ref #10): Never (1)   Engagement Notes Newton Pigg on 04/08/2021 08:55 AM CPP Chart Review: 16 min CPP Office Visit: 34 min CPP Office Visit Documentation: 65 min CPP Coordination of Care: [facility] Care Plan Completion: 22 min CPP Care Plan Review: 9 min    Patient Name: Michaela Kelley, Michaela Kelley DOB:  31-Mar-1940   Last Care Plan Update:  04/08/2021 COMPREHENSIVE CARE PLAN AND GOALS   HYPERTENSION  MOST RECENT BLOOD PRESSURE:     154/88 MY GOAL BLOOD PRESSURE:  <130/80 mmHG CURRENT MEDICATION AND DOSING:  Atenolol 50mg  once daily, Olmesartan 40mg  once daily THE GOALS WE HAVE CHOSEN ARE:    -Maintain an at goal blood pressure  BARRIERS TO ACHIEVING GOALS:  -Not at goal PLAN TO WORK ON THESE GOALS:   -Check BP at home every morning -If blood pressure is less than 130/80, take Atenolol/ If less than 130/80 do not take Atenolol.  -Continue taking Olmesartan 40mg  once daily -Limit Alcohol 1-2 drinks per day  -Reduce salt intake (< 1500mg / day)  -Diet: DASH diet (Choose fruits, vegetables, and low-fat dairy products. Increase whole grains, fish, poultry, nuts. Reduce red meats and sugars)  -Weight: 1 kg = ~1 mmHg reduction  -Exercise  CHOLESTEROL  MOST RECENT LABS:    03/15/2021 -TOTAL CHOLESTEROL: 245 -TRIGLYCERIDES: 200 -HDL: 77 -LDL: 134 CURRENT MEDICATION AND DOSING:  None THE GOALS WE HAVE CHOSEN ARE:    -Total Cholesterol goal under 200, Triglycerides goal under 150, HDL goal above 40, LDL goal under 100  BARRIERS TO ACHIEVING GOALS:  -Not at goal PLAN TO WORK ON THESE GOALS:  -Diet: high in plant sterols (fruits/ vegetables/ nuts/ whole grains/ legumes). Increase fiber intake (10-25g/day). Avoid foods high in cholesterol (red meat, egg yolks, dairy, oils/ butter). Choose low-fat options.  -Exercise  -Weight Management  Osteopenia/ Osteoporosis  CURRENT REGIMEN AND DOSING: Vitamin D 2,000 IU once daily THE GOALS WE HAVE CHOSEN ARE:  -Reduce fall risk  -Strengthen muscles and bones  PLAN TO WORK ON THESE GOALS:    -Assess home for fall risks, use handrails on stairs and safety bars in bathroom, non-skid mats for the bath tub/ shower  -Exercise: regular weight bearing exercises (walking, jogging) and muscle strengthening (weight training, yoga)    Allergies CURRENT REGIMEN AND DOSING:  Montelukast  10mg  once daily, Trelegy inhale 1 puff once daily, Proair inhaler inhale 2 puffs every 4 hours as needed THE GOALS WE HAVE CHOSEN ARE:  Not at goal BARRIERS TO ACHIEVING GOALS:  -Start taking Montelukast daily for best results in preventing asthma related allergic attacks -If noticing side effects from Trelegy, may schedule visit with PCP to consider step-down therapy for maintenance inhaler PLAN TO WORK ON THESE GOALS:       ACTIVE MEDICATION LIST  Your current medication list has been updated. To view, log in to your patient portal.  Call if any changes need to be made.   MEDICATION REVIEW  MEDICATION REVIEW CONDUCTED:   Yes   DATE:   04/08/2021 BEST POSSIBLE MEDICATION HISTORY  SOURCE:   Medical Records     HOW DO I? - WHEN DO I?   GET AHOLD OF MY DOCTOR?   DURING BUSINESS HOURS WHEN THE OFFICE IS OPEN    PHONE: [phone] AFTER HOURS [facility] NURSE WHEN THE OFFICE IS CLOSED   PHONE: [phone]  TALK TO [place] CARE COORDINATOR NAME: Newton Pigg  PHONE: [phone] EMAIL:  [email or web address] [email or web address] COORDINATOR STAFF   NAME: Vanetta Shawl, [facility]   Newton Pigg, CPP PHONE: [phone]  NOTE SECTION  Thank you for participating in the Chronic Care Management (CCM) program with Dr. Newton Pigg   This program takes a proactive approach to your health and my team will serve as a resource for you throughout the year. Please follow up at [phone] if you  have any questions or experience changes to your overall health. Your next CCM appointment will be conducted with Newton Pigg, PharmD as follows:    Date:   10/19/2021 Time:  9:00AM Over the Phone     Rachelle Hora. Jeannett Senior, PharmD  Clinical Pharmacist  [email or web address]  [phone]

## 2021-04-20 ENCOUNTER — Other Ambulatory Visit: Payer: Self-pay | Admitting: Internal Medicine

## 2021-04-21 ENCOUNTER — Encounter: Payer: Self-pay | Admitting: Nurse Practitioner

## 2021-04-21 ENCOUNTER — Other Ambulatory Visit: Payer: Self-pay

## 2021-04-21 ENCOUNTER — Ambulatory Visit (INDEPENDENT_AMBULATORY_CARE_PROVIDER_SITE_OTHER): Payer: PPO | Admitting: Nurse Practitioner

## 2021-04-21 VITALS — BP 132/70 | HR 82 | Temp 97.7°F | Wt 142.6 lb

## 2021-04-21 DIAGNOSIS — I499 Cardiac arrhythmia, unspecified: Secondary | ICD-10-CM

## 2021-04-21 DIAGNOSIS — I10 Essential (primary) hypertension: Secondary | ICD-10-CM

## 2021-04-21 NOTE — Patient Instructions (Signed)
Stop Olmesartan, restart Metoprolol 1/2 tab of the 50 mg tablet. Monitor BP and pulse Return to office in 2 weeks with log.  Atenolol Tablets What is this medication? ATENOLOL (a TEN oh lole) treats high blood pressure. It also prevents chest pain (angina) or further damage after a heart attack. It works by lowering your blood pressure and heart rate, making it easier for your heart to pump blood to the rest of your body. It belongs to a group of medications called beta blockers. This medicine may be used for other purposes; ask your health care provider or pharmacist if you have questions. COMMON BRAND NAME(S): Tenormin What should I tell my care team before I take this medication? They need to know if you have any of these conditions: Heart or blood vessel disease like slow heart rate, worsening heart failure, heart block, sick sinus syndrome or Raynaud's disease High blood sugar (diabetes) Kidney disease Lung or breathing disease, like asthma or emphysema Pheochromocytoma Thyroid disease An unusual or allergic reaction to atenolol, other beta-blockers, medications, foods, dyes, or preservatives Pregnant or trying to get pregnant Breast-feeding How should I use this medication? Take this medication by mouth. Take it as directed on the prescription label at the same time every day. You can take it with or without food. If it upsets your stomach, take it with food. Keep taking it unless your care team tells you to stop. Talk to your care team about the use of this medication in children. Special care may be needed. Overdosage: If you think you have taken too much of this medicine contact a poison control center or emergency room at once. NOTE: This medicine is only for you. Do not share this medicine with others. What if I miss a dose? If you miss a dose, take it as soon as you can. If it is almost time for your next dose, take only that dose. Do not take double or extra doses. What may  interact with this medication? This medication may interact with the following: Certain medications for blood pressure, heart disease, irregular heart beat Clonidine Digoxin Diuretics Dobutamine Epinephrine Isoproterenol NSAIDs, medications for pain and inflammation, like ibuprofen or naproxen Reserpine This list may not describe all possible interactions. Give your health care provider a list of all the medicines, herbs, non-prescription drugs, or dietary supplements you use. Also tell them if you smoke, drink alcohol, or use illegal drugs. Some items may interact with your medicine. What should I watch for while using this medication? Visit your care team for regular checks on your progress. Check your blood pressure as directed. Ask your care team what your blood pressure should be. Also, find out when you should contact them. Do not treat yourself for coughs, colds, or pain while you are using this medication without asking your care team for advice. Some medications may increase your blood pressure. You may get drowsy or dizzy. Do not drive, use machinery, or do anything that needs mental alertness until you know how this medication affects you. Do not stand up or sit up quickly, especially if you are an older patient. This reduces the risk of dizzy or fainting spells. Alcohol may interfere with the effect of this medication. Avoid alcoholic drinks. This medication may increase blood sugar. Ask your care team if changes in diet or medications are needed if you have diabetes. What side effects may I notice from receiving this medication? Side effects that you should report to your care team as  soon as possible: Allergic reactions--skin rash, itching, hives, swelling of the face, lips, tongue, or throat Heart failure--shortness of breath, swelling of the ankles, feet, or hands, sudden weight gain, unusual weakness or fatigue Low blood pressure--dizziness, feeling faint or lightheaded, blurry  vision Raynaud's--cool, numb, or painful fingers or toes that may change color from pale, to blue, to red Slow heartbeat--dizziness, feeling faint or lightheaded, confusion, trouble breathing, unusual weakness or fatigue Worsening mood or feelings of depression Side effects that usually do not require medical attention (report to your care team if they continue or are bothersome): Diarrhea Fatigue Nausea Vivid dreams or nightmares This list may not describe all possible side effects. Call your doctor for medical advice about side effects. You may report side effects to FDA at 1-800-FDA-1088. Where should I keep my medication? Keep out of the reach of children and pets. Store at room temperature between 20 and 25 degrees C (68 and 77 degrees F). Throw away any unused medication after the expiration date. NOTE: This sheet is a summary. It may not cover all possible information. If you have questions about this medicine, talk to your doctor, pharmacist, or health care provider.  2022 Elsevier/Gold Standard (2020-12-01 00:00:00)

## 2021-04-21 NOTE — Progress Notes (Signed)
Assessment and Plan:  Meela was seen today for acute visit.  Diagnoses and all orders for this visit:  Irregular heart beat Restart Atenolol 50 mg 1/2 tab daily Check BP and pulse daily and keep log Follow up  in 2 weeks Go to the ER if any chest pain, shortness of breath, nausea, dizziness, severe HA, changes vision/speech   Essential hypertension Stop Olmesartan and restart Atenolol 50 mg /2 tab daily - continue DASH diet, exercise and monitor at home. Call if greater than 130/80.   Go to the ER if any chest pain, shortness of breath, nausea, dizziness, severe HA, changes vision/speech        Further disposition pending results of labs. Discussed med's effects and SE's.   Over 30 minutes of exam, counseling, chart review, and critical decision making was performed.   Future Appointments  Date Time Provider [place]  09/08/2021  2:30 PM Liane Comber, NP [facility] None  10/19/2021  9:00 AM Adegoke, Seth Bake, RPH [facility] None    ------------------------------------------------------------------------------------------------------------------   HPI BP 132/70    Pulse 82    Temp 97.7 F (36.5 C)    Wt 142 lb 9.6 oz (64.7 kg)    SpO2 98%    BMI 26.08 kg/m  81 y.o.female presents for blood pressure running low and pulse running slightly high.  She stopped her Atenolol 1 week ago and started taking only 1/2 of Olmesartan. Bp log shows values 96-120/60-70's.  Pulse has been running 70-80's with outlier of 104 and 113 today at home.  Past Medical History:  Diagnosis Date   Allergy    Arthritis    Cataract    GERD (gastroesophageal reflux disease)    Hx: UTI (urinary tract infection)    Hyperlipidemia    Hypertension    Prediabetes    Seasonal allergic rhinitis    Vitamin D deficiency      Allergies  Allergen Reactions   Atorvastatin Anaphylaxis    Myalgias Myalgias   Codeine Nausea And Vomiting and Other (See Comments)    Severe headaches (tolerates  Dilaudid) Other reaction(s): Other (See Comments) Severe headaches (tolerates Dilaudid)   Alendronate Other (See Comments)    Severe arthrlalgias   Hydrocodone-Acetaminophen Nausea And Vomiting and Other (See Comments)    Severe headaches (tolerates Dilaudid)   Hydrocodone-Acetaminophen Nausea And Vomiting    Other reaction(s): Other (See Comments) Severe headaches (tolerates Dilaudid)   Oxycodone-Acetaminophen Nausea And Vomiting and Other (See Comments)    Severe headaches (tolerates Dilaudid)   Oxycodone-Acetaminophen Nausea And Vomiting    Other reaction(s): Other (See Comments) Severe headaches (tolerates Dilaudid)    Oxycodone-Aspirin Nausea And Vomiting and Other (See Comments)    Severe headaches (tolerates Dilaudid)   Oxycodone-Aspirin Nausea And Vomiting    Other reaction(s): Other (See Comments) Severe headaches (tolerates Dilaudid)    Current Outpatient Medications on File Prior to Visit  Medication Sig   aspirin EC 81 MG tablet Take 81 mg by mouth daily.   celecoxib (CELEBREX) 200 MG capsule celecoxib 200 mg capsule   Cholecalciferol (VITAMIN D PO) Take 2,000 Int'l Units by mouth daily.   Cyanocobalamin (B-12) 1000 MCG SUBL Place 1 tablet under the tongue daily. (Patient taking differently: Place 1 tablet under the tongue daily. Takes liquid form)   MAGNESIUM PO Take 500 mg by mouth daily. Magesium Oxide   montelukast (SINGULAIR) 10 MG tablet TAKE ONE TABLET DAILY FOR ALLERGY   olmesartan (BENICAR) 40 MG tablet ONE TABLET EVERY DAY FOR BLOOD PRESSURE  PROAIR HFA 108 (90 Base) MCG/ACT inhaler INHALE 2 PUFFS INTO THE LUNGS EVERY 6 HOURS AS NEEDED FOR WHEEZING OR SHORTNESS OF BREATH   TURMERIC CURCUMIN PO Take 750 mg by mouth.   vitamin C (ASCORBIC ACID) 500 MG tablet Take 500 mg by mouth daily.   atenolol (TENORMIN) 50 MG tablet TAKE ONE TABLET DAILY BLOOD PRESSURE (Patient not taking: Reported on 04/21/2021)   Fluticasone-Umeclidin-Vilant (TRELEGY ELLIPTA) 200-62.5-25  MCG/INH AEPB Use  1 inhalation  Daily (Patient not taking: Reported on 04/08/2021)   TURMERIC PO Take 800 mg by mouth 2 (two) times daily. Curcumin Turmeric (Patient not taking: Reported on 04/21/2021)   zinc gluconate 50 MG tablet Take 25 mg by mouth daily. (Patient not taking: Reported on 04/21/2021)   No current facility-administered medications on file prior to visit.    ROS: all negative except above.   Physical Exam:  BP 132/70    Pulse 82    Temp 97.7 F (36.5 C)    Wt 142 lb 9.6 oz (64.7 kg)    SpO2 98%    BMI 26.08 kg/m   General Appearance: Well nourished, in no apparent distress. Eyes: PERRLA, EOMs, conjunctiva no swelling or erythema Sinuses: No Frontal/maxillary tenderness ENT/Mouth: Ext aud canals clear, TMs without erythema, bulging. No erythema, swelling, or exudate on post pharynx.  Tonsils not swollen or erythematous. Hearing normal.  Neck: Supple, thyroid normal.  Respiratory: Respiratory effort normal, BS equal bilaterally without rales, rhonchi, wheezing or stridor.  Cardio: Regularly irregular rhythm with no MRGs. Brisk peripheral pulses without edema.  Abdomen: Soft, + BS.  Non tender, no guarding, rebound, hernias, masses. Lymphatics: Non tender without lymphadenopathy.  Musculoskeletal: Full ROM, 5/5 strength, normal gait.  Skin: Warm, dry without rashes, lesions, ecchymosis.  Neuro: Cranial nerves intact. Normal muscle tone, no cerebellar symptoms. Sensation intact.  Psych: Awake and oriented X 3, normal affect, Insight and Judgment appropriate.  EKG: NSR, unchanged T wave and PAC's    Alejandra Barna Kathyrn Drown, NP 11:48 AM [facility]

## 2021-04-26 DIAGNOSIS — J302 Other seasonal allergic rhinitis: Secondary | ICD-10-CM | POA: Diagnosis not present

## 2021-04-26 DIAGNOSIS — E559 Vitamin D deficiency, unspecified: Secondary | ICD-10-CM | POA: Diagnosis not present

## 2021-04-26 DIAGNOSIS — E785 Hyperlipidemia, unspecified: Secondary | ICD-10-CM | POA: Diagnosis not present

## 2021-04-26 DIAGNOSIS — I10 Essential (primary) hypertension: Secondary | ICD-10-CM | POA: Diagnosis not present

## 2021-05-04 ENCOUNTER — Other Ambulatory Visit: Payer: Self-pay

## 2021-05-04 ENCOUNTER — Other Ambulatory Visit: Payer: Self-pay | Admitting: Internal Medicine

## 2021-05-04 DIAGNOSIS — Z1231 Encounter for screening mammogram for malignant neoplasm of breast: Secondary | ICD-10-CM

## 2021-05-05 ENCOUNTER — Ambulatory Visit: Payer: PPO | Admitting: Nurse Practitioner

## 2021-05-06 ENCOUNTER — Telehealth: Payer: Self-pay

## 2021-05-06 NOTE — Progress Notes (Deleted)
Assessment and Plan:  There are no diagnoses linked to this encounter.    Further disposition pending results of labs. Discussed med's effects and SE's.   Over 30 minutes of exam, counseling, chart review, and critical decision making was performed.   Future Appointments  Date Time Provider [place]  05/10/2021 10:30 AM Magda Bernheim, NP [facility] None  05/14/2021  2:00 PM [facility] 3 [facility]  09/08/2021  2:30 PM Liane Comber, NP [facility] None  10/19/2021  9:00 AM Newton Pigg, [facility] None    ------------------------------------------------------------------------------------------------------------------   HPI There were no vitals taken for this visit. 81 y.o.female presents for  Past Medical History:  Diagnosis Date   Allergy    Arthritis    Cataract    GERD (gastroesophageal reflux disease)    Hx: UTI (urinary tract infection)    Hyperlipidemia    Hypertension    Prediabetes    Seasonal allergic rhinitis    Vitamin D deficiency      Allergies  Allergen Reactions   Atorvastatin Anaphylaxis    Myalgias Myalgias   Codeine Nausea And Vomiting and Other (See Comments)    Severe headaches (tolerates Dilaudid) Other reaction(s): Other (See Comments) Severe headaches (tolerates Dilaudid)   Alendronate Other (See Comments)    Severe arthrlalgias   Hydrocodone-Acetaminophen Nausea And Vomiting and Other (See Comments)    Severe headaches (tolerates Dilaudid)   Hydrocodone-Acetaminophen Nausea And Vomiting    Other reaction(s): Other (See Comments) Severe headaches (tolerates Dilaudid)   Oxycodone-Acetaminophen Nausea And Vomiting and Other (See Comments)    Severe headaches (tolerates Dilaudid)   Oxycodone-Acetaminophen Nausea And Vomiting    Other reaction(s): Other (See Comments) Severe headaches (tolerates Dilaudid)    Oxycodone-Aspirin Nausea And Vomiting and Other (See Comments)    Severe headaches (tolerates Dilaudid)    Oxycodone-Aspirin Nausea And Vomiting    Other reaction(s): Other (See Comments) Severe headaches (tolerates Dilaudid)    Current Outpatient Medications on File Prior to Visit  Medication Sig   aspirin EC 81 MG tablet Take 81 mg by mouth daily.   celecoxib (CELEBREX) 200 MG capsule celecoxib 200 mg capsule   Cholecalciferol (VITAMIN D PO) Take 2,000 Int'l Units by mouth daily.   MAGNESIUM PO Take 500 mg by mouth daily. Magesium Oxide   montelukast (SINGULAIR) 10 MG tablet TAKE ONE TABLET DAILY FOR ALLERGY   olmesartan (BENICAR) 40 MG tablet ONE TABLET EVERY DAY FOR BLOOD PRESSURE   PROAIR HFA 108 (90 Base) MCG/ACT inhaler INHALE 2 PUFFS INTO THE LUNGS EVERY 6 HOURS AS NEEDED FOR WHEEZING OR SHORTNESS OF BREATH   TURMERIC CURCUMIN PO Take 750 mg by mouth.   vitamin C (ASCORBIC ACID) 500 MG tablet Take 500 mg by mouth daily.   No current facility-administered medications on file prior to visit.    ROS: all negative except above.   Physical Exam:  There were no vitals taken for this visit.  General Appearance: Well nourished, in no apparent distress. Eyes: PERRLA, EOMs, conjunctiva no swelling or erythema Sinuses: No Frontal/maxillary tenderness ENT/Mouth: Ext aud canals clear, TMs without erythema, bulging. No erythema, swelling, or exudate on post pharynx.  Tonsils not swollen or erythematous. Hearing normal.  Neck: Supple, thyroid normal.  Respiratory: Respiratory effort normal, BS equal bilaterally without rales, rhonchi, wheezing or stridor.  Cardio: RRR with no MRGs. Brisk peripheral pulses without edema.  Abdomen: Soft, + BS.  Non tender, no guarding, rebound, hernias, masses. Lymphatics: Non tender without lymphadenopathy.  Musculoskeletal:  Full ROM, 5/5 strength, normal gait.  Skin: Warm, dry without rashes, lesions, ecchymosis.  Neuro: Cranial nerves intact. Normal muscle tone, no cerebellar symptoms. Sensation intact.  Psych: Awake and oriented X 3, normal affect,  Insight and Judgment appropriate.     Magda Bernheim, NP 10:03 AM [facility]

## 2021-05-06 NOTE — Telephone Encounter (Signed)
Pt. Returned call. Hypertension review call was completed. Total time spent: 25 minutes  Vanetta Shawl, [facility]  Is the patient enrolled in RPM with BP Monitor?: No BP #1 reading (last): 132/70 on: 04/21/2021 BP #2 reading: 154/88 on: 03/15/2021 BP #3 reading: 153/90 on: 01/28/2021 Any of the last 3 BP > 140/90 mmHg?: Yes  What recent interventions have been made by any provider to improve the patient's conditions in the last 3 months?:  01/28/21-Dr. Melford Aase (PCP)-Pt. presented for with hx/o Covid 5 month ago in May 2022. Patient's sx's resolves but she's still c/o fatigability. No significant respiratory sx's. No medication changes noted.  03/15/21-DrMelford Aase- Pt, presented a Screening /Preventative Visit & comprehensive evaluation and management of multiple medical co-morbidities.  STOPPED  Benzonatate 200 MG Take 1 perle 3 x / day to prevent cough  Dexamethasone 4 MG Take 1 tab 3 x day - 3 days, then 2 x day - 3 days, then 1 tab daily  Ezetimibe 10 MG Take 1 tab daily for cholesterol. Fluorouracil 5 % fluorouracil 5 % topical cream  Furosemide 40 MG TAKE ONE TABLET EACH DAY FOR BLOOD PRESSURE AND FLUID RETENTION/ANKLE SWELLING  Promethazine-DM 6.25-15 MG/5ML Take 1 tsp every 4 hours if needed for cough   Any recent hospitalizations or ED visits since last visit with CPP?: No  Adherence rates for STAR metric medications: Olmesartan 40mg  / 90DS / 11/19/20 & 03/23/21 Adherence rates for medications indicated for disease state being reviewed: Olmesartan 40mg  / 90DS / 11/19/20 & 03/23/21 Does the patient have >5 day gap between last estimated fill dates for any of the above medications?: No  Able to connect with the Patient?: Yes Is the patient monitoring his/her BP?: Yes How often are you checking your BP?: daily Home BP Reading #1 (most recent): 128/71 Home BP Reading #2: 128/73 Home BP Reading #3: 110/70 Is the patient having any low BP Readings  <90/60?: No Is the patient having any BP readings above >180/100?: No Is the patient's average BP>140/90?: No What is your blood pressure goal?: <130/40 Educate patient to inform proper points on checking BP at home: Do not drink caffeine or smoke a cigarette at least 30 min. prior to checking., Make sure using the right size cuff, the length of the cuffs bladder should be at least equal to 75% of the circumference of the upper arm., Sit with feet flat on the floor, arm at heart level., When taking resting blood pressure: sit quietly for 5 minutes, not within 30 min. of exercising, no talking.  What diet changes have you made to improve your Blood Pressure Control? (Multiselect): other, eating more fruits and vegetables  Details: doesnt eat sugar What exercise are you doing to improve your Blood Pressure Control? (Multiselect): other, walking  Details: [employer] classes on tuesday, Plays golf, walks, Pt. stated she is very active  Misc. Response/Information:: Pt. stated she saw Marta Lamas, NP recently and her Amlodipine was reduced from 50 to 25mg  once daily. Patients BP is improving per pt.

## 2021-05-06 NOTE — Telephone Encounter (Signed)
Called patient for hypertension review call. Patient did not answer call. [place].  Total time spent: [place], [facility]

## 2021-05-07 ENCOUNTER — Ambulatory Visit: Payer: PPO | Admitting: Nurse Practitioner

## 2021-05-10 ENCOUNTER — Ambulatory Visit: Payer: PPO | Admitting: Nurse Practitioner

## 2021-05-14 ENCOUNTER — Ambulatory Visit
Admission: RE | Admit: 2021-05-14 | Discharge: 2021-05-14 | Disposition: A | Discharge status: Home / Self Care | Payer: PPO | Source: Ambulatory Visit | Attending: Internal Medicine | Admitting: Internal Medicine

## 2021-05-14 DIAGNOSIS — Z1231 Encounter for screening mammogram for malignant neoplasm of breast: Secondary | ICD-10-CM

## 2021-05-18 NOTE — Progress Notes (Signed)
Assessment and Plan:  Diagnoses and all orders for this visit:  Essential hypertension - continue medications, DASH diet, exercise and monitor at home. Call if greater than 130/80.   - Once she finishes current prescription of Atenolol she is to notify the office and we will send in prescription for Atenolol 25 mg  - Go to the ER if any chest pain, shortness of breath, nausea, dizziness, severe HA, changes vision/speech   Medication management Continued    Further disposition pending results of labs. Discussed med's effects and SE's.   Over 30 minutes of exam, counseling, chart review, and critical decision making was performed.   Future Appointments  Date Time Provider [place]  09/08/2021  2:30 PM Liane Comber, NP [facility] None  10/19/2021  9:00 AM Adegoke, Seth Bake, RPH [facility] None    ------------------------------------------------------------------------------------------------------------------   HPI BP 120/68    Pulse 66    Temp (!) 97.5 F (36.4 C)    Wt 144 lb 3.2 oz (65.4 kg)    SpO2 97%    BMI 26.37 kg/m   80 y.o.female presents for reevaluation of blood pressure . BP at home 110-120's/60-70's. BP Readings from Last 3 Encounters:  05/19/21 120/68  04/21/21 132/70  03/15/21 (!) 154/88   Denies headaches,  chest pain, dizziness, shortness of breath.  Pulses have been running high 50's-70  Past Medical History:  Diagnosis Date   Allergy    Arthritis    Cataract    GERD (gastroesophageal reflux disease)    Hx: UTI (urinary tract infection)    Hyperlipidemia    Hypertension    Prediabetes    Seasonal allergic rhinitis    Vitamin D deficiency      Allergies  Allergen Reactions   Atorvastatin Anaphylaxis    Myalgias Myalgias   Codeine Nausea And Vomiting and Other (See Comments)    Severe headaches (tolerates Dilaudid) Other reaction(s): Other (See Comments) Severe headaches (tolerates Dilaudid)   Alendronate Other (See Comments)     Severe arthrlalgias   Hydrocodone-Acetaminophen Nausea And Vomiting and Other (See Comments)    Severe headaches (tolerates Dilaudid)   Hydrocodone-Acetaminophen Nausea And Vomiting    Other reaction(s): Other (See Comments) Severe headaches (tolerates Dilaudid)   Oxycodone-Acetaminophen Nausea And Vomiting and Other (See Comments)    Severe headaches (tolerates Dilaudid)   Oxycodone-Acetaminophen Nausea And Vomiting    Other reaction(s): Other (See Comments) Severe headaches (tolerates Dilaudid)    Oxycodone-Aspirin Nausea And Vomiting and Other (See Comments)    Severe headaches (tolerates Dilaudid)   Oxycodone-Aspirin Nausea And Vomiting    Other reaction(s): Other (See Comments) Severe headaches (tolerates Dilaudid)    Current Outpatient Medications on File Prior to Visit  Medication Sig   aspirin EC 81 MG tablet Take 81 mg by mouth daily.   atenolol (TENORMIN) 25 MG tablet Take by mouth daily.   celecoxib (CELEBREX) 200 MG capsule celecoxib 200 mg capsule   Cholecalciferol (VITAMIN D PO) Take 2,000 Int'l Units by mouth daily.   MAGNESIUM PO Take 500 mg by mouth daily. Magesium Oxide   montelukast (SINGULAIR) 10 MG tablet TAKE ONE TABLET DAILY FOR ALLERGY   TURMERIC CURCUMIN PO Take 750 mg by mouth.   vitamin C (ASCORBIC ACID) 500 MG tablet Take 500 mg by mouth daily.   olmesartan (BENICAR) 40 MG tablet ONE TABLET EVERY DAY FOR BLOOD PRESSURE (Patient not taking: Reported on 05/19/2021)   PROAIR HFA 108 (90 Base) MCG/ACT inhaler INHALE 2 PUFFS INTO THE LUNGS EVERY 6  HOURS AS NEEDED FOR WHEEZING OR SHORTNESS OF BREATH (Patient not taking: Reported on 05/19/2021)   No current facility-administered medications on file prior to visit.    ROS: all negative except above.   Physical Exam:  BP 120/68    Pulse 66    Temp (!) 97.5 F (36.4 C)    Wt 144 lb 3.2 oz (65.4 kg)    SpO2 97%    BMI 26.37 kg/m   General Appearance: Well nourished, in no apparent distress. Eyes: PERRLA,  EOMs, conjunctiva no swelling or erythema Sinuses: No Frontal/maxillary tenderness ENT/Mouth: Ext aud canals clear, TMs without erythema, bulging. No erythema, swelling, or exudate on post pharynx.  Tonsils not swollen or erythematous. Hearing normal.  Neck: Supple, thyroid normal.  Respiratory: Respiratory effort normal, BS equal bilaterally without rales, rhonchi, wheezing or stridor.  Cardio: Regularly irregular with no MRGs. Brisk peripheral pulses without edema.  Abdomen: Soft, + BS.  Non tender, no guarding, rebound, hernias, masses. Lymphatics: Non tender without lymphadenopathy.  Musculoskeletal: Full ROM, 5/5 strength, normal gait.  Skin: Warm, dry without rashes, lesions, ecchymosis.  Neuro: Cranial nerves intact. Normal muscle tone, no cerebellar symptoms. Sensation intact.  Psych: Awake and oriented X 3, normal affect, Insight and Judgment appropriate.     Magda Bernheim, NP 2:42 PM [facility]

## 2021-05-19 ENCOUNTER — Other Ambulatory Visit: Payer: Self-pay

## 2021-05-19 ENCOUNTER — Encounter: Payer: Self-pay | Admitting: Nurse Practitioner

## 2021-05-19 ENCOUNTER — Ambulatory Visit (INDEPENDENT_AMBULATORY_CARE_PROVIDER_SITE_OTHER): Payer: PPO | Admitting: Nurse Practitioner

## 2021-05-19 VITALS — BP 120/68 | HR 66 | Temp 97.5°F | Wt 144.2 lb

## 2021-05-19 DIAGNOSIS — Z79899 Other long term (current) drug therapy: Secondary | ICD-10-CM | POA: Diagnosis not present

## 2021-05-19 DIAGNOSIS — I10 Essential (primary) hypertension: Secondary | ICD-10-CM | POA: Diagnosis not present

## 2021-05-20 ENCOUNTER — Other Ambulatory Visit: Payer: Self-pay

## 2021-05-20 MED ORDER — ATENOLOL 25 MG PO TABS: 25.0000 mg | ORAL_TABLET | Freq: Every day | ORAL | 3 refills | Status: DC

## 2021-05-20 NOTE — Addendum Note (Signed)
Addended by: Magda Bernheim on: 05/20/2021 10:54 AM   Modules accepted: Orders

## 2021-05-27 DIAGNOSIS — Z719 Counseling, unspecified: Secondary | ICD-10-CM

## 2021-06-18 DIAGNOSIS — M720 Palmar fascial fibromatosis [Dupuytren]: Secondary | ICD-10-CM | POA: Insufficient documentation

## 2021-06-18 DIAGNOSIS — M1711 Unilateral primary osteoarthritis, right knee: Secondary | ICD-10-CM | POA: Diagnosis not present

## 2021-06-18 HISTORY — DX: Palmar fascial fibromatosis (dupuytren): M72.0

## 2021-07-06 DIAGNOSIS — M1711 Unilateral primary osteoarthritis, right knee: Secondary | ICD-10-CM | POA: Diagnosis not present

## 2021-07-08 ENCOUNTER — Telehealth: Payer: Self-pay

## 2021-07-08 NOTE — Telephone Encounter (Signed)
LM-07/08/21-Called patient at [phone] to complete fall risk review call. Unable to reach pt. Michaela Kelley. ? ?Total time spent: 12 min ?

## 2021-07-13 DIAGNOSIS — M1711 Unilateral primary osteoarthritis, right knee: Secondary | ICD-10-CM | POA: Diagnosis not present

## 2021-07-20 NOTE — Telephone Encounter (Signed)
LM-07/20/21-Calling pt. At [phone] To complete Fall risk review call. Unable to reach pt. [place]. ? ?Total time spent: 2 min. ?

## 2021-07-28 ENCOUNTER — Telehealth: Payer: Self-pay

## 2021-07-28 NOTE — Telephone Encounter (Signed)
LM-07/28/21-Calling pt. To complete fall risk review call. Unable to reach patient. [place] X3. ? ?Total time spent: 2 min. ?

## 2021-07-29 NOTE — Telephone Encounter (Signed)
LM-07/29/21-Pt. Returned call. Informed pt. About [facility] to [facility] transfer and encouraged patient to reach out if any new health concerns arise. Pt. Verbalized understanding and agreed. ? ?Total time spent: 5 min. ?

## 2021-08-11 DIAGNOSIS — H35372 Puckering of macula, left eye: Secondary | ICD-10-CM | POA: Diagnosis not present

## 2021-08-11 DIAGNOSIS — Z961 Presence of intraocular lens: Secondary | ICD-10-CM | POA: Diagnosis not present

## 2021-08-11 DIAGNOSIS — H26491 Other secondary cataract, right eye: Secondary | ICD-10-CM | POA: Diagnosis not present

## 2021-08-11 DIAGNOSIS — H52203 Unspecified astigmatism, bilateral: Secondary | ICD-10-CM | POA: Diagnosis not present

## 2021-09-08 ENCOUNTER — Ambulatory Visit: Payer: PPO | Admitting: Adult Health

## 2021-09-16 DIAGNOSIS — M1611 Unilateral primary osteoarthritis, right hip: Secondary | ICD-10-CM | POA: Diagnosis not present

## 2021-09-16 DIAGNOSIS — M25551 Pain in right hip: Secondary | ICD-10-CM | POA: Diagnosis not present

## 2021-09-16 HISTORY — DX: Pain in right hip: M25.551

## 2021-09-17 ENCOUNTER — Ambulatory Visit: Payer: PPO | Admitting: Adult Health

## 2021-09-29 DIAGNOSIS — M25551 Pain in right hip: Secondary | ICD-10-CM | POA: Diagnosis not present

## 2021-10-01 ENCOUNTER — Ambulatory Visit (INDEPENDENT_AMBULATORY_CARE_PROVIDER_SITE_OTHER): Payer: PPO | Admitting: Adult Health

## 2021-10-01 ENCOUNTER — Encounter: Payer: Self-pay | Admitting: Adult Health

## 2021-10-01 VITALS — BP 160/84 | HR 58 | Temp 97.9°F | Wt 141.0 lb

## 2021-10-01 DIAGNOSIS — E538 Deficiency of other specified B group vitamins: Secondary | ICD-10-CM

## 2021-10-01 DIAGNOSIS — N9489 Other specified conditions associated with female genital organs and menstrual cycle: Secondary | ICD-10-CM

## 2021-10-01 DIAGNOSIS — M1711 Unilateral primary osteoarthritis, right knee: Secondary | ICD-10-CM

## 2021-10-01 DIAGNOSIS — I771 Stricture of artery: Secondary | ICD-10-CM | POA: Diagnosis not present

## 2021-10-01 DIAGNOSIS — Z136 Encounter for screening for cardiovascular disorders: Secondary | ICD-10-CM

## 2021-10-01 DIAGNOSIS — Z0000 Encounter for general adult medical examination without abnormal findings: Secondary | ICD-10-CM

## 2021-10-01 DIAGNOSIS — K5790 Diverticulosis of intestine, part unspecified, without perforation or abscess without bleeding: Secondary | ICD-10-CM | POA: Diagnosis not present

## 2021-10-01 DIAGNOSIS — I8392 Asymptomatic varicose veins of left lower extremity: Secondary | ICD-10-CM

## 2021-10-01 DIAGNOSIS — I10 Essential (primary) hypertension: Secondary | ICD-10-CM

## 2021-10-01 DIAGNOSIS — Z79899 Other long term (current) drug therapy: Secondary | ICD-10-CM | POA: Diagnosis not present

## 2021-10-01 DIAGNOSIS — M810 Age-related osteoporosis without current pathological fracture: Secondary | ICD-10-CM | POA: Diagnosis not present

## 2021-10-01 DIAGNOSIS — R6889 Other general symptoms and signs: Secondary | ICD-10-CM | POA: Diagnosis not present

## 2021-10-01 DIAGNOSIS — E785 Hyperlipidemia, unspecified: Secondary | ICD-10-CM | POA: Diagnosis not present

## 2021-10-01 DIAGNOSIS — E559 Vitamin D deficiency, unspecified: Secondary | ICD-10-CM

## 2021-10-01 DIAGNOSIS — Z860100 Personal history of colon polyps, unspecified: Secondary | ICD-10-CM

## 2021-10-01 DIAGNOSIS — Z0001 Encounter for general adult medical examination with abnormal findings: Secondary | ICD-10-CM | POA: Diagnosis not present

## 2021-10-01 DIAGNOSIS — Z6825 Body mass index (BMI) 25.0-25.9, adult: Secondary | ICD-10-CM

## 2021-10-01 MED ORDER — ATENOLOL 25 MG PO TABS: ORAL_TABLET | ORAL | 3 refills | Status: DC

## 2021-10-01 MED ORDER — OLMESARTAN MEDOXOMIL 40 MG PO TABS: ORAL_TABLET | ORAL | 1 refills | Status: DC

## 2021-10-01 NOTE — Patient Instructions (Signed)
Michaela Kelley , Thank you for taking time to come for your Medicare Wellness Visit. I appreciate your ongoing commitment to your health goals. Please review the following plan we discussed and let me know if I can assist you in the future.   These are the goals we discussed:  Goals      Blood Pressure < 130/80     Exercise 150 min/wk Moderate Activity     LDL CALC < 130     Reduce alcohol intake     Limit to 1 glass a wine per day        This is a list of the screening recommended for you and due dates:  Health Maintenance  Topic Date Due   Zoster (Shingles) Vaccine (1 of 2) Never done   COVID-19 Vaccine (4 - Booster for Pfizer series) 10/17/2021*   Tetanus Vaccine  10/02/2022*   Flu Shot  10/26/2021   Pneumonia Vaccine  Completed   DEXA scan (bone density measurement)  Completed   HPV Vaccine  Aged Out  *Topic was postponed. The date shown is not the original due date.     Monitor BP daily at home, goal around 130/80 If persistently 140/80+, restart 1/2 tab olmesartan If blood pressures better, can try reducing atenolol to 1/2 tab for 2 weeks, then stop Increase olmesartan to full tab (40 mg) IF needed for blood pressure goal      Coronary Calcium Scan A coronary calcium scan is an imaging test used to look for deposits of plaque in the inner lining of the blood vessels of the heart (coronary arteries). Plaque is made up of calcium, protein, and fatty substances. These deposits of plaque can partly clog and narrow the coronary arteries without producing any symptoms or warning signs. This puts a person at risk for a heart attack. A coronary calcium scan is performed using a computed tomography (CT) scanner machine without using a dye (contrast). This test is recommended for people who are at moderate risk for heart disease. The test can find plaque deposits before symptoms develop. Tell a health care provider about: Any allergies you have. All medicines you are taking,  including vitamins, herbs, eye drops, creams, and over-the-counter medicines. Any problems you or family members have had with anesthetic medicines. Any bleeding problems you have. Any surgeries you have had. Any medical conditions you have. Whether you are pregnant or may be pregnant. What are the risks? Generally, this is a safe procedure. However, problems may occur, including: Harm to a pregnant woman and her unborn baby. This test involves the use of radiation. Radiation exposure can be dangerous to a pregnant woman and her unborn baby. If you are pregnant or think you may be pregnant, you should not have this procedure done. A slight increase in the risk of cancer. This is because of the radiation involved in the test. The amount of radiation from one test is similar to the amount of radiation you are naturally exposed to over one year. What happens before the procedure? Ask your health care provider for any specific instructions on how to prepare for this procedure. You may be asked to avoid products that contain caffeine, tobacco, or nicotine for 4 hours before the procedure. What happens during the procedure?  You will undress and remove any jewelry from your neck or chest. You may need to remove hearing aides and dentures. Women may need to remove their bras. You will put on a hospital gown. Sticky electrodes  will be placed on your chest. The electrodes will be connected to an electrocardiogram (ECG) machine to record a tracing of the electrical activity of your heart. You will lie down on your back on a curved bed that is attached to the [place]. You may be given medicine to slow down your heart rate so that clear pictures can be created. You will be moved into the CT scanner, and the CT scanner will take pictures of your heart. During this time, you will be asked to lie still and hold your breath for 10-20 seconds at a time while each picture of your heart is being taken. The  procedure may vary among health care providers and hospitals. What can I expect after the procedure? You can return to your normal activities. It is up to you to get the results of your procedure. Ask your health care provider, or the department that is doing the procedure, when your results will be ready. Summary A coronary calcium scan is an imaging test used to look for deposits of plaque in the inner lining of the blood vessels of the heart. Plaque is made up of calcium, protein, and fatty substances. A coronary calcium scan is performed using a CT scanner machine without contrast. Generally, this is a safe procedure. Tell your health care provider if you are pregnant or may be pregnant. Ask your health care provider for any specific instructions on how to prepare for this procedure. You can return to your normal activities after the scan is done. This information is not intended to replace advice given to you by your health care provider. Make sure you discuss any questions you have with your health care provider. Document Revised: 02/21/2021 Document Reviewed: 02/21/2021 Elsevier Patient Education  [name].

## 2021-10-01 NOTE — Progress Notes (Signed)
MEDICARE ANNUAL WELLNESS  Assessment:   Annual Medicare Wellness Visit Due annually  Health maintenance reviewed - check pharmacy about shingrix  Essential hypertension - has been elevated in the past week per patient - monitor, if persistently above 140/80 restart olmesartan 20 mg, increase to 40 mg if needed for goal in 2 weeks - can try slow taper off of atenolol per patient preference IF well controlled by olmesartan  - continue medications, DASH diet, exercise and monitor at home. Call if greater than 130/80.  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   Hyperlipidemia, unspecified hyperlipidemia type -     Lipid panel check lipids decrease fatty foods increase activity.  Discussed goals at length; didn't tolerate atorvastatin, zetia She is interested in CT coronary calcium scan cash pay after discussion, will order $99 cash pay option Retry low dose low frequency rosuvastatin or pravastatin if high risk   Medication management -     Magnesium  Vitamin D deficiency Continue supplement;   Seasonal allergic rhinitis, unspecified trigger Continue meds, singulaire and PRN zyrtec is working well   Pelvic congestion syndrome Incidentally noted on imaging; denies pelvic or vaginal sx; monitor   Osteoporosis without current pathological fracture, unspecified osteoporosis type Repeat DEXA 2 years, continue calcium, vitamin D, weight bearing exercises Stopped alendronate due to intolerable arthralgias with attempts x 2  Osteoarthritis, R knee Ortho follows No needs/doing well   Varicose vein, asymptomatic Compression hose encouraged; monitor  History of colon polyps 5 year recall due 08/2021 was optional per Dr. Sharlett Iles Extended discussion today of risks and benefits Patient would like to proceed with  referral back due to excellent life expectancy High fiber diet, low red/processed meat encouraged - GI referral placed    Future Appointments  Date Time Provider [place]  01/06/2022  9:30 AM Unk Pinto, MD [facility] None  04/15/2022 10:00 AM Darrol Jump, NP [facility] None  10/03/2022 11:00 AM Darrol Jump, NP [facility] None     Subjective:  Michaela Kelley is a 81 y.o. female who presents for AWV and follow up. 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   She had abdominal pain, CT abd 01/15/2020 showed focal diverticulitis involving the distal sigmoid colon, she responded well to abx. Also incidentally noted enlarged left-sided periuterine veins are noted within large left ovarian vein, consistent with pelvic congestion syndrome, continues to deny notable vaginal or pelvic sx.   She has orthotic for left foot pain, seeing Dr. Paulla Dolly, doing well recently.  Follows with [facility], Dr. Delilah Shan for knee injections.  Will take celebrex occasionally, will do topical CBD.  Also getting R hip injections, has helped.   Allergies are doing well at this time, mainly takes Singulair in the spring and fall.    BMI is Body mass index is 25.79 kg/m., she has been working on diet and exercise. She walks daily and golfs regularly, also doing light weights and resistance bands. Plans to restart water aerobics.  Wt Readings from Last 3 Encounters:  10/01/21 141 lb (64 kg)  05/19/21 144 lb 3.2 oz (65.4  kg)  04/21/21 142 lb 9.6 oz (64.7 kg)   She has broken blood vessels bilateral legs, worse left leg, no pain, wearing compression.    Her blood pressure has been controlled at home (labile, ranges 110s/70s-150s/80s, has been higher  just in the last few days), today their BP is BP: (!) 160/84. Currently only taking atenolol 25 mg, has olmesartan 40 mg but was advised to stop at some point -  She does workout, walking, stretching, water aerobics and golfing.  She denies chest pain, shortness of breath, dizziness.  She is not on cholesterol medication, on omega 3 supplement, she is off lipitor due to myalgias, zetia was started but patient decided to stop (had SE, can't recall what). She is interested in CT coronary calcium screening. Her cholesterol is not at goal. The cholesterol last visit was:   Lab Results  Component Value Date   CHOL 245 (H) 03/15/2021   HDL 77 03/15/2021   LDLCALC 134 (H) 03/15/2021   TRIG 200 (H) 03/15/2021   CHOLHDL 3.2 03/15/2021    She has been working on diet and exercise for glucose management, and denies paresthesia of the feet, polydipsia, polyuria and visual disturbances. Last A1C in the office was:  Lab Results  Component Value Date   HGBA1C 5.3 03/15/2021    She has stable CKD II monitored at this office:  Lab Results  Component Value Date   EGFR 69 03/15/2021   Patient is on Vitamin D supplement taking 5000 IU daily    Lab Results  Component Value Date   VD25OH 77 03/15/2021   She did start on SL B12 supplement  Lab Results  Component Value Date   VITAMINB12 353 09/08/2020     Medication Review: Current Outpatient Medications on File Prior to Visit  Medication Sig Dispense Refill   aspirin EC 81 MG tablet Take 81 mg by mouth daily.     celecoxib (CELEBREX) 200 MG capsule celecoxib 200 mg capsule     Cholecalciferol (VITAMIN D PO) Take 2,000 Int'l Units by mouth daily.     Cyanocobalamin 200 MCG/SPRAY LIQD Take 200 mcg by mouth daily.     MAGNESIUM PO Take 500 mg by mouth daily. [place]agesium Oxide     montelukast (SINGULAIR) 10 MG tablet TAKE ONE TABLET DAILY FOR ALLERGY 90 tablet 3   TURMERIC CURCUMIN PO Take 750 mg by mouth.     vitamin C (ASCORBIC ACID) 500 MG tablet  Take 500 mg by mouth daily.     PROAIR HFA 108 (90 Base) MCG/ACT inhaler INHALE 2 PUFFS INTO THE LUNGS EVERY 6 HOURS AS NEEDED FOR WHEEZING OR SHORTNESS OF BREATH (Patient not taking: Reported on 05/19/2021) 8.5 g 0   No current facility-administered medications on file prior to visit.    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[place]) 01/24/2017   Medication management 10/03/2013   Seasonal allergic rhinitis    Vitamin D deficiency    Hyperlipidemia 01/02/2008   Essential hypertension 01/02/2008    Screening Tests Immunization History  Administered Date(s) Administered   Influenza, High Dose Seasonal PF 03/10/2015, 01/10/2017, 02/01/2018, 02/12/2019, 02/13/2020, 01/28/2021   PFIZER(Purple Top)SARS-COV-2 Vaccination 04/16/2019, 05/07/2019, 04/03/2020   Pneumococcal Conjugate-13 05/05/2014   Pneumococcal Polysaccharide-23 06/28/2006  Td 05/27/2011   Health Maintenance  Topic Date Due   Zoster Vaccines- Shingrix (1 of 2) Never done   COVID-19 Vaccine (4 - Booster for Pfizer series) 10/17/2021 (Originally 05/29/2020)   TETANUS/TDAP  10/02/2022 (Originally 05/26/2021)   INFLUENZA VACCINE  10/26/2021   Pneumonia Vaccine 54+ Years old  Completed   DEXA SCAN  Completed   HPV VACCINES  Aged Out    Tetanus: 2013 will get PRN Shingrix: check with insurance and get at pharmacy  Covid 19: 2/2, 2021, pfizer + booster  MGM: 05/14/2021  DEXA 02/24/2020 - L forearm only, didn't tolerate fosamax, will do lifestyle   Colonoscopy: 08/2016 (Dr. Sharlett Iles) - tubular adenoma x 1 - ? 5 year follow up if desired   Names of Other Physician/Practitioners you currently use: 1. [facility] here for primary  care 2. Dr. Celene Squibb, eye doctor, 07/2020 3. Dr. Junita Push, dentist, last 2022, goes q6 4. Dr. Sharlett Iles, GI 5. [facility] 6. Dr. Marguerite Olea, derm, PRN  Patient Care Team: Unk Pinto, MD as PCP - General (Internal Medicine) Newton Pigg, [facility] as Pharmacist (Pharmacist)    Allergies Allergies  Allergen Reactions   Atorvastatin Anaphylaxis    Myalgias Myalgias   Codeine Nausea And Vomiting and Other (See Comments)    Severe headaches (tolerates Dilaudid) Other reaction(s): Other (See Comments) Severe headaches (tolerates Dilaudid)   Alendronate Other (See Comments)    Severe arthrlalgias   Hydrocodone-Acetaminophen Nausea And Vomiting and Other (See Comments)    Severe headaches (tolerates Dilaudid)   Hydrocodone-Acetaminophen Nausea And Vomiting    Other reaction(s): Other (See Comments) Severe headaches (tolerates Dilaudid)   Oxycodone-Acetaminophen Nausea And Vomiting and Other (See Comments)    Severe headaches (tolerates Dilaudid)   Oxycodone-Acetaminophen Nausea And Vomiting    Other reaction(s): Other (See Comments) Severe headaches (tolerates Dilaudid)    Oxycodone-Aspirin Nausea And Vomiting and Other (See Comments)    Severe headaches (tolerates Dilaudid)   Oxycodone-Aspirin Nausea And Vomiting    Other reaction(s): Other (See Comments) Severe headaches (tolerates Dilaudid)    SURGICAL HISTORY She  has a past surgical history that includes Knee Arthroplasty (2010); Dilation and curettage of uterus; Tonsillectomy; Back surgery (1999); Total shoulder arthroplasty (02/10/2011); and Total shoulder arthroplasty (12/22/2011). FAMILY HISTORY Her family history includes Breast cancer in her paternal grandmother; Breast cancer (age of onset: 34) in her mother; Diabetes in her paternal grandfather; Esophageal cancer in her brother; Heart disease in her brother, maternal grandfather, and maternal grandmother; Hypertension in her father. SOCIAL HISTORY She   reports that she quit smoking about 29 years ago. Her smoking use included cigarettes. She has a 5.00 pack-year smoking history. She has never used smokeless tobacco. She reports current alcohol use of about 8.0 standard drinks of alcohol per week. She reports that she does not use drugs.  MEDICARE WELLNESS OBJECTIVES: Physical activity: Current Exercise Habits: Home exercise routine, Type of exercise: walking, Time (Minutes): 30, Frequency (Times/Week): 5, Weekly Exercise (Minutes/Week): 150, Intensity: Mild, Exercise limited by: orthopedic condition(s) Cardiac risk factors: Cardiac Risk Factors include: advanced age (>41mn, >>43women);dyslipidemia;hypertension;smoking/ tobacco exposure Depression/mood screen:      01/30/2021    7:23 PM  Depression screen PHQ 2/9  Decreased Interest 0  Down, Depressed, Hopeless 0  PHQ - 2 Score 0    ADLs:     10/01/2021   11:22 AM 03/14/2021   11:19 PM  In your present state of health, do you have any difficulty performing the following  activities:  Hearing? 0 0  Comment has hearing aids   Vision? 0 0  Difficulty concentrating or making decisions? 0 0  Walking or climbing stairs? 0 0  Comment manages slowly, 1 step at a time   Dressing or bathing? 0 0  Doing errands, shopping? 0 0     Cognitive Testing  Alert? Yes  Normal Appearance?Yes  Oriented to person? Yes  Place? Yes   Time? Yes  Recall of three objects?  Yes  Can perform simple calculations? Yes  Displays appropriate judgment?Yes  Can read the correct time from a watch face?Yes  EOL planning: Does Patient Have a Medical Advance Directive?: Yes Type of Advance Directive: Healthcare Power of Attorney, Living will Does patient want to make changes to medical advance directive?: No - Patient declined Copy of [place] in Chart?: No - copy requested      Review of Systems  Constitutional:  Negative for malaise/fatigue and weight loss.  HENT:  Negative for hearing  loss and tinnitus.   Eyes:  Negative for blurred vision and double vision.  Respiratory:  Negative for cough, sputum production, shortness of breath and wheezing.   Cardiovascular:  Negative for chest pain, palpitations, orthopnea, claudication, leg swelling and PND.  Gastrointestinal:  Negative for abdominal pain, blood in stool, constipation, diarrhea, heartburn, melena, nausea and vomiting.  Genitourinary: Negative.   Musculoskeletal:  Positive for joint pain. Negative for falls and myalgias. Back pain: R knee, R hip, ortho follows for injections. Skin:  Negative for rash.  Neurological:  Negative for dizziness, tingling, sensory change, weakness and headaches.  Endo/Heme/Allergies:  Negative for polydipsia.  Psychiatric/Behavioral: Negative.  Negative for depression, memory loss, substance abuse and suicidal ideas. The patient is not nervous/anxious and does not have insomnia.   All other systems reviewed and are negative.    Objective:     Blood pressure (!) 160/84, pulse (!) 58, temperature 97.9 F (36.6 C), weight 141 lb (64 kg), SpO2 99 %. Body mass index is 25.79 kg/m.  General appearance: alert, no distress, WD/WN, female HEENT: normocephalic, sclerae anicteric, TMs pearly, nares patent, no discharge or erythema, pharynx normal Oral cavity: MMM, no lesions Neck: supple, no lymphadenopathy, no thyromegaly, no masses Heart: RRR, normal S1, S2, no murmurs Lungs: CTA bilaterally, no wheezes, rhonchi, or rales Abdomen: +bs, soft, non tender, non distended, no masses, no hepatomegaly, no splenomegaly Musculoskeletal: nontender, no obvious deformity, non-antalgic gait. Very mild R knee effusion without heat or erythema.  Extremities: no edema, no cyanosis, no clubbing. Left leg with non-tender enlarged varicose vein to medial thigh Pulses: 2+ symmetric, upper and lower extremities, normal cap refill Neurological: alert, oriented x 3, CN2-12 intact, strength normal upper extremities  and lower extremities, sensation normal throughout, DTRs 2+ throughout, no cerebellar signs, gait normal Psychiatric: normal affect, behavior normal, pleasant  Skin: warm, dry, intact; no lesions or rashes   Medicare Attestation I have personally reviewed: The patient's medical and social history Their use of alcohol, tobacco or illicit drugs Their current medications and supplements The patient's functional ability including ADLs,fall risks, home safety risks, cognitive, and hearing and visual impairment Diet and physical activities Evidence for depression or mood disorders  The patient's weight, height, BMI, and visual acuity have been recorded in the chart.  I have made referrals, counseling, and provided education to the patient based on review of the above and I have provided the patient with a written personalized care plan for preventive services.  Izora Ribas, NP   10/01/2021

## 2021-10-02 LAB — CBC WITH DIFFERENTIAL/PLATELET
Absolute Monocytes: 882 cells/uL (ref 200–950)
Basophils Absolute: 15 cells/uL (ref 0–200)
Basophils Relative: 0.1 %
Eosinophils Absolute: 0 cells/uL — ABNORMAL LOW (ref 15–500)
Eosinophils Relative: 0 %
HCT: 38.5 % (ref 35.0–45.0)
Hemoglobin: 13.1 g/dL (ref 11.7–15.5)
Lymphs Abs: 1125 cells/uL (ref 850–3900)
MCH: 31.4 pg (ref 27.0–33.0)
MCHC: 34 g/dL (ref 32.0–36.0)
MCV: 92.3 fL (ref 80.0–100.0)
MPV: 9 fL (ref 7.5–12.5)
Monocytes Relative: 5.8 %
Neutro Abs: 13178 cells/uL — ABNORMAL HIGH (ref 1500–7800)
Neutrophils Relative %: 86.7 %
Platelets: 311 10*3/uL (ref 140–400)
RBC: 4.17 10*6/uL (ref 3.80–5.10)
RDW: 12.7 % (ref 11.0–15.0)
Total Lymphocyte: 7.4 %
WBC: 15.2 10*3/uL — ABNORMAL HIGH (ref 3.8–10.8)

## 2021-10-02 LAB — COMPLETE METABOLIC PANEL WITH GFR
AG Ratio: 1.9 (calc) (ref 1.0–2.5)
ALT: 22 U/L (ref 6–29)
AST: 18 U/L (ref 10–35)
Albumin: 4.7 g/dL (ref 3.6–5.1)
Alkaline phosphatase (APISO): 64 U/L (ref 37–153)
BUN: 20 mg/dL (ref 7–25)
CO2: 24 mmol/L (ref 20–32)
Calcium: 10 mg/dL (ref 8.6–10.4)
Chloride: 98 mmol/L (ref 98–110)
Creat: 0.81 mg/dL (ref 0.60–0.95)
Globulin: 2.5 g/dL (calc) (ref 1.9–3.7)
Glucose, Bld: 90 mg/dL (ref 65–99)
Potassium: 4.6 mmol/L (ref 3.5–5.3)
Sodium: 133 mmol/L — ABNORMAL LOW (ref 135–146)
Total Bilirubin: 0.7 mg/dL (ref 0.2–1.2)
Total Protein: 7.2 g/dL (ref 6.1–8.1)
eGFR: 73 mL/min/{1.73_m2} (ref >=60)

## 2021-10-02 LAB — LIPID PANEL
Cholesterol: 294 mg/dL — ABNORMAL HIGH (ref <200)
HDL: 113 mg/dL (ref >=50)
LDL Cholesterol (Calc): 154 mg/dL (calc) — ABNORMAL HIGH
Non-HDL Cholesterol (Calc): 181 mg/dL (calc) — ABNORMAL HIGH (ref <130)
Total CHOL/HDL Ratio: 2.6 (calc) (ref <5.0)
Triglycerides: 144 mg/dL (ref <150)

## 2021-10-02 LAB — TSH: TSH: 2.04 mIU/L (ref 0.40–4.50)

## 2021-10-02 LAB — MAGNESIUM: Magnesium: 2.5 mg/dL (ref 1.5–2.5)

## 2021-10-02 LAB — VITAMIN B12: Vitamin B-12: 308 pg/mL (ref 200–1100)

## 2021-10-13 DIAGNOSIS — M25551 Pain in right hip: Secondary | ICD-10-CM | POA: Diagnosis not present

## 2021-10-13 DIAGNOSIS — M1611 Unilateral primary osteoarthritis, right hip: Secondary | ICD-10-CM | POA: Diagnosis not present

## 2021-10-18 ENCOUNTER — Telehealth: Payer: Self-pay | Admitting: Nurse Practitioner

## 2021-10-18 NOTE — Telephone Encounter (Signed)
Pt started a new medication for BP and since Bp has been within normal range but resting pulse is in the 100's. Wanting to know what could be causing this and if it could be the new medication. Wanting to speak to a nurse

## 2021-10-19 ENCOUNTER — Telehealth: Payer: PPO | Admitting: Pharmacy Technician

## 2021-10-19 NOTE — Telephone Encounter (Signed)
It appears she was started on olmesartan and the Atenolol was stopped.  Atenolol would have kept her pulse in a much lower range.  The values noted are appropriate and she may have initially had a slightly higher pulse right after stopping the Atenolol.  Her next appointment is not until 12/2021 if she would like [place] to check prior to that please schedule appointment

## 2021-11-18 DIAGNOSIS — M25551 Pain in right hip: Secondary | ICD-10-CM | POA: Diagnosis not present

## 2021-11-18 DIAGNOSIS — M1611 Unilateral primary osteoarthritis, right hip: Secondary | ICD-10-CM | POA: Diagnosis not present

## 2021-11-23 ENCOUNTER — Encounter: Payer: Self-pay | Admitting: Internal Medicine

## 2021-11-26 DIAGNOSIS — M25551 Pain in right hip: Secondary | ICD-10-CM | POA: Diagnosis not present

## 2021-12-02 ENCOUNTER — Telehealth: Payer: Self-pay | Admitting: Nurse Practitioner

## 2021-12-02 NOTE — Telephone Encounter (Signed)
Has had HR between 80s-90s while sleeping since starting Olmesartan, she said that her BP is normal but she is concerned about the HR

## 2021-12-03 DIAGNOSIS — M1611 Unilateral primary osteoarthritis, right hip: Secondary | ICD-10-CM | POA: Diagnosis not present

## 2021-12-03 DIAGNOSIS — M25551 Pain in right hip: Secondary | ICD-10-CM | POA: Diagnosis not present

## 2021-12-03 NOTE — Telephone Encounter (Signed)
Left message on voicemail to call back. Copied providers comments and sent in [place].

## 2021-12-14 ENCOUNTER — Ambulatory Visit
Admission: RE | Admit: 2021-12-14 | Discharge: 2021-12-14 | Disposition: A | Discharge status: Home / Self Care | Payer: PPO | Source: Ambulatory Visit | Attending: Adult Health | Admitting: Adult Health

## 2021-12-14 DIAGNOSIS — E785 Hyperlipidemia, unspecified: Secondary | ICD-10-CM

## 2021-12-14 DIAGNOSIS — Z136 Encounter for screening for cardiovascular disorders: Secondary | ICD-10-CM

## 2021-12-14 DIAGNOSIS — Z0389 Encounter for observation for other suspected diseases and conditions ruled out: Secondary | ICD-10-CM | POA: Diagnosis not present

## 2021-12-21 NOTE — H&P (Signed)
TOTAL HIP ADMISSION H&P  Patient is admitted for right total hip arthroplasty.  Subjective:  Chief Complaint: Right hip pain  HPI: Michaela Kelley, 81 y.o. female, has a history of pain and functional disability in the right hip due to arthritis and patient has failed non-surgical conservative treatments for greater than 12 weeks to include NSAID's and/or analgesics, flexibility and strengthening excercises, use of assistive devices, and activity modification. Onset of symptoms was abrupt with rapidlly worsening course since that time. The patient noted no past surgery on the right hip. Patient currently rates pain in the right hip at 7 out of 10 with activity. Patient has night pain, worsening of pain with activity and weight bearing, and pain that interfers with activities of daily living. Patient has evidence of  edema in the femoral head and neck. She has an effusion and the joint has gone to bone-on-bone, which is a significant progression from her plain films taken in 08/2021  by imaging studies. This condition presents safety issues increasing the risk of falls. There is no current active infection.  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   Medication management 10/03/2013   Seasonal allergic rhinitis    Vitamin D deficiency    Hyperlipidemia 01/02/2008   Essential hypertension 01/02/2008    Past Medical History:  Diagnosis Date   Allergy    Arthritis    Cataract    GERD (gastroesophageal reflux disease)    Hx: UTI (urinary tract infection)    Hyperlipidemia    Hypertension    Prediabetes    Seasonal allergic rhinitis    Vitamin D deficiency     Past Surgical  History:  Procedure Laterality Date   BACK SURGERY  1999   L5    DILATION AND CURETTAGE OF UTERUS     KNEE ARTHROPLASTY  2010   L knee   TONSILLECTOMY     TOTAL SHOULDER ARTHROPLASTY  02/10/2011   Procedure: TOTAL SHOULDER ARTHROPLASTY;  Surgeon: Metta Clines Supple;  Location: [facility];  Service: Orthopedics;  Laterality: Left;  TOTAL SHOULDER ARTHROPLASTY LEFT SIDE   TOTAL SHOULDER ARTHROPLASTY  12/22/2011   Procedure: TOTAL SHOULDER ARTHROPLASTY;  Surgeon: Marin Shutter, MD;  Location: [facility];  Service: Orthopedics;  Laterality: Right;  right total shoulder arthroplasty    Prior to Admission medications   Medication Sig Start Date End Date Taking? Authorizing Provider  aspirin EC 81 MG tablet Take 81 mg by mouth daily.    [provider]  atenolol (TENORMIN) 25 MG tablet If blood pressure improved on olmesartan, reduce to 1/2 tab for 2 weeks then stop. 10/01/21   Liane Comber, NP  celecoxib (CELEBREX) 200 MG capsule celecoxib 200 mg capsule    [provider]  Cholecalciferol (VITAMIN D PO) Take 2,000 Int'l Units by mouth daily.    [provider]  Cyanocobalamin 200 MCG/SPRAY LIQD Take 200 mcg by mouth daily.    [provider]  MAGNESIUM PO Take 500 mg by mouth daily. Magesium Oxide    [provider]  montelukast (SINGULAIR) 10 MG tablet TAKE ONE TABLET DAILY FOR ALLERGY 04/20/21   Alycia Rossetti, NP  olmesartan (BENICAR) 40 MG tablet Restart  taking 1/2 tab daily, increase to whole tab in 2 weeks if needed for blood pressure goal <130/80. 10/01/21   Liane Comber, NP  PROAIR HFA 108 (90 Base) MCG/ACT inhaler INHALE 2 PUFFS INTO THE LUNGS EVERY 6 HOURS AS NEEDED FOR WHEEZING OR SHORTNESS OF BREATH Patient not taking: Reported on 05/19/2021 12/23/19   Vladimir Crofts, PA-C  TURMERIC CURCUMIN PO Take 750 mg by mouth.    [provider]  vitamin C (ASCORBIC ACID) 500 MG tablet Take 500 mg by mouth daily.    [provider]     Allergies  Allergen Reactions   Atorvastatin Anaphylaxis    Myalgias Myalgias   Codeine Nausea And Vomiting and Other (See Comments)    Severe headaches (tolerates Dilaudid) Other reaction(s): Other (See Comments) Severe headaches (tolerates Dilaudid)   Alendronate Other (See Comments)    Severe arthrlalgias   Hydrocodone-Acetaminophen Nausea And Vomiting and Other (See Comments)    Severe headaches (tolerates Dilaudid)   Hydrocodone-Acetaminophen Nausea And Vomiting    Other reaction(s): Other (See Comments) Severe headaches (tolerates Dilaudid)   Oxycodone-Acetaminophen Nausea And Vomiting and Other (See Comments)    Severe headaches (tolerates Dilaudid)   Oxycodone-Acetaminophen Nausea And Vomiting    Other reaction(s): Other (See Comments) Severe headaches (tolerates Dilaudid)    Oxycodone-Aspirin Nausea And Vomiting and Other (See Comments)    Severe headaches (tolerates Dilaudid)   Oxycodone-Aspirin Nausea And Vomiting    Other reaction(s): Other (See Comments) Severe headaches (tolerates Dilaudid)    Social History   Socioeconomic History   Marital status: Widowed    Spouse name: Not on file   Number of children: 2   Years of education: Not on file   Highest education level: Not on file  Occupational History   Not on file  Tobacco Use   Smoking status: Former    Packs/day: 0.25    Years: 20.00    Total pack years: 5.00    Types: Cigarettes    Quit date: 12/16/1991    Years since quitting: 30.0   Smokeless tobacco: Never  Vaping Use   Vaping Use: Never used  Substance and Sexual Activity   Alcohol use: Yes    Alcohol/week: 8.0 standard drinks of alcohol    Types: 8 Glasses of wine per week    Comment: 6-8 glasses per week    Drug use: No   Sexual activity: Not Currently    Partners: Male    Birth control/protection: Post-menopausal  Other Topics Concern   Not on file  Social History Narrative   Not on file   Social Determinants of Health    Financial Resource Strain: Not on file  Food Insecurity: No Food Insecurity (04/09/2021)   Hunger Vital Sign    Worried About Running Out of Food in the Last Year: Never true    Ran Out of Food in the Last Year: Never true  Transportation Needs: No Transportation Needs (04/09/2021)   PRAPARE - Hydrologist (Medical): No    Lack of Transportation (Non-Medical): No  Physical Activity: Sufficiently Active (02/12/2019)   Exercise Vital Sign    Days of Exercise per Week: 5 days    Minutes of Exercise per Session: 30 min  Stress: No Stress Concern Present (02/12/2019)   [place]    Feeling of Stress : Only a little  Social Connections: Not on file  Intimate Partner Violence: Not on file  Tobacco Use: Medium Risk (10/01/2021)   Patient History    Smoking Tobacco Use: Former    Smokeless Tobacco Use: Never    Passive Exposure: Not on file   Social History   Substance and Sexual Activity  Alcohol Use Yes   Alcohol/week: 8.0 standard drinks of alcohol   Types: 8 Glasses of wine per week   Comment: 6-8 glasses per week     Family History  Problem Relation Age of Onset   Breast cancer Mother 50   Hypertension Father    Heart disease Brother    Esophageal cancer Brother    Heart disease Maternal Grandmother    Heart disease Maternal Grandfather    Breast cancer Paternal Grandmother    Diabetes Paternal Grandfather    Colon cancer Neg Hx    Rectal cancer Neg Hx    Stomach cancer Neg Hx     Review of Systems  Constitutional:  Negative for chills and fever.  HENT: Negative.    Eyes: Negative.   Respiratory:  Negative for cough and shortness of breath.   Cardiovascular:  Negative for chest pain and palpitations.  Gastrointestinal:  Negative for abdominal pain, constipation, diarrhea, nausea and vomiting.  Genitourinary:  Negative for dysuria, frequency and urgency.   Musculoskeletal:  Positive for joint pain.  Skin:  Negative for rash.   Objective:  Physical Exam: Well nourished and well developed.  General: Alert and oriented x3, cooperative and pleasant, no acute distress.  Head: normocephalic, atraumatic, neck supple.  Eyes: EOMI. Abdomen: non-tender to palpation and soft, normoactive bowel sounds. Musculoskeletal: She has a significantly antalgic gait pattern with a cane  Right Hip Exam: The range of motion: Flexion to 100 degrees, Internal Rotation is minimal, External Rotation to 20 degrees, and abduction to 20 degrees with pain on range of motion. There is no tenderness over the greater trochanteric bursa.  Calves soft and nontender. Motor function intact in LE. Strength 5/5 LE bilaterally. Neuro: Distal pulses 2+. Sensation to light touch intact in LE.  Vital signs in last 24 hours: BP: ()/()  Arterial Line BP: ()/()   Imaging Review Plain radiographs demonstrate moderate degenerative joint disease of the right hip. The bone quality appears to be adequate for age and reported activity level.  Assessment/Plan:  End stage arthritis, right hip  The patient history, physical examination, clinical judgement of the provider and imaging studies are consistent with end stage degenerative joint disease of the right hip and total hip arthroplasty is deemed medically necessary. The treatment options including medical management, injection therapy, arthroscopy and arthroplasty were discussed at length. The risks and benefits of total hip arthroplasty were presented and reviewed. The risks due to aseptic loosening, infection, stiffness, dislocation/subluxation, thromboembolic complications and other imponderables were discussed. The patient acknowledged the explanation, agreed to proceed with the plan and consent was signed. Patient is being admitted for inpatient treatment for surgery, pain control, PT, OT, prophylactic antibiotics, VTE prophylaxis,  progressive ambulation and ADLs and discharge planning.The patient is planning to be discharged  home .  Therapy Plans: HEP Disposition: Home with Daughter Planned DVT Prophylaxis: Aspirin 325 mg BID  DME Needed: None PCP: Unk Pinto, MD (contacting for clearance) TXA: IV Allergies: codeine (severe headaches, vomiting) Anesthesia Concerns: N/V BMI: 26.3 Last HgbA1c: not diabetic  Pharmacy: [facility]  Other: -PCP appt 10/11 - given clearance form to take to visit -Dilaudid, tramadol, methocarbamol  - Patient was instructed on what medications to stop prior to surgery. -  Follow-up visit in 2 weeks with Dr. Wynelle Link - Begin physical therapy following surgery - Pre-operative lab work as pre-surgical testing - Prescriptions will be provided in hospital at time of discharge  R. Jaynie Bream, PA-C Orthopedic Surgery [facility] [place]

## 2021-12-27 NOTE — Patient Instructions (Signed)

## 2021-12-27 NOTE — Progress Notes (Signed)
Future Appointments  Date Time Provider Department  12/28/2021  2:30 PM Unk Pinto, MD [facility]  02/01/2022  9:50 AM Pyrtle, Lajuan Lines, MD [facility]  04/15/2022 10:00 AM Darrol Jump, NP [facility]  10/03/2022 11:00 AM Darrol Jump, NP [facility]    History of Present Illness:       This very nice 81 y.o. WWF presents for 3 month follow up with HTN, HLD, Pre-Diabetes and Vitamin D Deficiency.                                                     Patient is tentatively scheduled for a Rt THA on Oct 23 /2023 by Dr Wynelle Link.  She reports pain in the Rt hip with ambulation. Patient denies any recent c/o cough, head/chest congestion, CP, dyspnea or dependent edema.        Patient is treated for HTN  circa 2003 & BP has been controlled at home. Today's BP is at goal - 110/60. Patient has had no complaints of any cardiac type chest pain, palpitations, dyspnea /orthopnea / PND, dizziness, claudication or dependent edema.       Patient is Statin Intolerant & Hyperlipidemia is not controlled with diet .  Last Lipids were not at goal :  Lab Results  Component Value Date   CHOL 294 (H) 10/01/2021   HDL 113 10/01/2021   LDLCALC 154 (H) 10/01/2021   TRIG 144 10/01/2021   CHOLHDL 2.6 10/01/2021     Also, the patient has history of PreDiabetes and has had no symptoms of reactive hypoglycemia, diabetic polys, paresthesias or visual blurring.  Last A1c was normal & at goal.   Lab Results  Component Value Date   HGBA1C 5.3 03/15/2021                                                         Further, the patient also has history of Vitamin D Deficiency ("37"/2009) and supplements vitamin D . Last vitamin D was at goal :  Lab Results  Component Value Date   VD25OH 77 03/15/2021     Current Outpatient Medications on File Prior to Visit  Medication Sig   aspirin EC 81 MG tablet Take  daily.   celecoxib 200 MG capsule capsule   VITAMIN D    2,000 Units  Takes  daily.   Cyanocobalamin  200 MCG/SPRAY LIQD Take daily.   MAGNESIUM  500 mg  Take  daily   montelukast   10 MG tablet TAKE ONE TABLET DAILY FOR ALLERGY   olmesartan   40 MG tablet Restart taking 1  tab daily   TURMERIC   CURCUMIN 750 mg  Take daily     Allergies  Allergen Reactions   Atorvastatin Anaphylaxis  Myalgias   Codeine Nausea And Vomiting;  Severe headaches   Alendronate Severe arthrlalgias   Hydrocodone-Acetaminophen Nausea And Vomiting;  Severe headaches   Oxycodone-Acetaminophen Nausea And Vomiting Severe headaches (tolerates Dilaudid)        PMHx:   Past Medical History:  Diagnosis Date   Allergy    Arthritis    Cataract    GERD (gastroesophageal reflux disease)  Hx: UTI (urinary tract infection)    Hyperlipidemia    Hypertension    Prediabetes    Seasonal allergic rhinitis    Vitamin D deficiency      Immunization History  Administered Date(s) Administered   Influenza, High Dose Seasonal PF 03/10/2015, 01/10/2017, 02/01/2018, 02/12/2019, 02/13/2020, 01/28/2021   PFIZER(Purple Top)SARS-COV-2 Vaccination 04/16/2019, 05/07/2019, 04/03/2020   Pneumococcal Conjugate-13 05/05/2014   Pneumococcal Polysaccharide-23 06/28/2006   Td 05/27/2011     Past Surgical History:  Procedure Laterality Date   BACK SURGERY  1999   L5    DILATION AND CURETTAGE OF UTERUS     KNEE ARTHROPLASTY  2010   L knee   TONSILLECTOMY     TOTAL SHOULDER ARTHROPLASTY  02/10/2011   Procedure: TOTAL SHOULDER ARTHROPLASTY;  Surgeon: Metta Clines Supple;  Location: [place];  Service: Orthopedics;  Laterality: Left;  TOTAL SHOULDER ARTHROPLASTY LEFT SIDE   TOTAL SHOULDER ARTHROPLASTY  12/22/2011   Procedure: TOTAL SHOULDER ARTHROPLASTY;  Surgeon: Marin Shutter, MD;  Location: [place];  Service: Orthopedics;  Laterality: Right;  right total shoulder arthroplasty    FHx:    Reviewed / unchanged  SHx:    Reviewed / unchanged   Systems Review:  Constitutional: Denies fever, chills, wt changes, headaches, insomnia,  fatigue, night sweats, change in appetite. Eyes: Denies redness, blurred vision, diplopia, discharge, itchy, watery eyes.  ENT: Denies discharge, congestion, post nasal drip, epistaxis, sore throat, earache, hearing loss, dental pain, tinnitus, vertigo, sinus pain, snoring.  CV: Denies chest pain, palpitations, irregular heartbeat, syncope, dyspnea, diaphoresis, orthopnea, PND, claudication or edema. Respiratory: denies cough, dyspnea, DOE, pleurisy, hoarseness, laryngitis, wheezing.  Gastrointestinal: Denies dysphagia, odynophagia, heartburn, reflux, water brash, abdominal pain or cramps, nausea, vomiting, bloating, diarrhea, constipation, hematemesis, melena, hematochezia  or hemorrhoids. Genitourinary: Denies dysuria, frequency, urgency, nocturia, hesitancy, discharge, hematuria or flank pain. Musculoskeletal: Denies arthralgias, myalgias, stiffness, jt. swelling, pain, limping or strain/sprain.  Skin: Denies pruritus, rash, hives, warts, acne, eczema or change in skin lesion(s). Neuro: No weakness, tremor, incoordination, spasms, paresthesia or pain. Psychiatric: Denies confusion, memory loss or sensory loss. Endo: Denies change in weight, skin or hair change.  Heme/Lymph: No excessive bleeding, bruising or enlarged lymph nodes.  Physical Exam  BP 110/60   Pulse 87   Temp 97.9 F (36.6 C)   Resp 16   Ht '5\' 2"'$  (1.575 m)   Wt 142 lb 12.8 oz (64.8 kg)   SpO2 97%   BMI 26.12 kg/m   Appears  well nourished, well groomed  and in no distress.  Eyes: PERRLA, EOMs, conjunctiva no swelling or erythema. Sinuses: No frontal/maxillary tenderness ENT/Mouth: EAC's clear, TM's nl w/o erythema, bulging. Nares clear w/o erythema, swelling, exudates. Oropharynx clear without erythema or exudates. Oral hygiene is good. Tongue normal, non obstructing. Hearing intact.  Neck: Supple. Thyroid not palpable. Car 2+/2+ without bruits, nodes or JVD. Chest: Respirations nl with BS clear & equal w/o rales,  rhonchi, wheezing or stridor.  Cor: Heart sounds normal w/ regular rate and rhythm without sig. murmurs, gallops, clicks or rubs. Peripheral pulses normal and equal  without edema.  Abdomen: Soft & bowel sounds normal. Non-tender w/o guarding, rebound, hernias, masses or organomegaly.  Lymphatics: Unremarkable.  Musculoskeletal: Full ROM all peripheral extremities, joint stability, 5/5 strength and normal gait.  Skin: Warm, dry without exposed rashes, lesions or ecchymosis apparent.  Neuro: Cranial nerves intact, reflexes equal bilaterally. Sensory-motor testing grossly intact. Tendon reflexes grossly intact.  Pysch: Alert & oriented x 3.  Insight and judgement nl & appropriate. No ideations.  Assessment and Plan:   1. Essential hypertension  - Continue medication, monitor blood pressure at home.  - Continue DASH diet.  Reminder to go to the ER if any CP,  SOB, nausea, dizziness, severe HA, changes vision/speech.   - CBC with Differential/Platelet - COMPLETE METABOLIC PANEL WITH GFR - Magnesium - TSH  2. Hyperlipidemia, mixed - Continue diet/meds, exercise,& lifestyle modifications.  - Continue monitor periodic cholesterol/liver & renal functions   - Lipid panel - TSH  3. Abnormal glucose  - Continue diet, exercise  - Lifestyle modifications.  - Monitor appropriate labs   - Hemoglobin A1c - Insulin, random  4. Vitamin D deficiency  - Continue supplementation    - VITAMIN D 25 Hydroxy   5. Primary osteoarthritis of right hip   6. Pre-operative clearance  - Patient is Average Risk for Anticipated Rt THA &  Is cleared for surgery   - CBC with Differential/Platelet - COMPLETE METABOLIC PANEL WITH GFR - Protime-INR  7. Medication management - CBC with Differential/Platelet - COMPLETE METABOLIC PANEL WITH GFR - Magnesium - Lipid panel - TSH - Hemoglobin A1c - Insulin, random - VITAMIN D 25 Hydroxy  - Protime-INR          Discussed  regular exercise, BP  monitoring, weight control to achieve/maintain BMI less than 25 and discussed med and SE's. Recommended labs to assess /monitor clinical status .  I discussed the assessment and treatment plan with the patient. The patient was provided an opportunity to ask questions and all were answered. The patient agreed with the plan and demonstrated an understanding of the instructions.  I provided over 30 minutes of exam, counseling, chart review and  complex critical decision making.        The patient was advised to call back or seek an in-person evaluation if the symptoms worsen or if the condition fails to improve as anticipated.   Kirtland Bouchard, MD

## 2021-12-28 ENCOUNTER — Ambulatory Visit (INDEPENDENT_AMBULATORY_CARE_PROVIDER_SITE_OTHER): Payer: PPO | Admitting: Internal Medicine

## 2021-12-28 ENCOUNTER — Encounter: Payer: Self-pay | Admitting: Internal Medicine

## 2021-12-28 VITALS — BP 110/60 | HR 87 | Temp 97.9°F | Resp 16 | Ht 62.0 in | Wt 142.8 lb

## 2021-12-28 DIAGNOSIS — M1611 Unilateral primary osteoarthritis, right hip: Secondary | ICD-10-CM | POA: Diagnosis not present

## 2021-12-28 DIAGNOSIS — Z79899 Other long term (current) drug therapy: Secondary | ICD-10-CM

## 2021-12-28 DIAGNOSIS — E559 Vitamin D deficiency, unspecified: Secondary | ICD-10-CM | POA: Diagnosis not present

## 2021-12-28 DIAGNOSIS — R7309 Other abnormal glucose: Secondary | ICD-10-CM | POA: Diagnosis not present

## 2021-12-28 DIAGNOSIS — Z7901 Long term (current) use of anticoagulants: Secondary | ICD-10-CM | POA: Diagnosis not present

## 2021-12-28 DIAGNOSIS — E782 Mixed hyperlipidemia: Secondary | ICD-10-CM

## 2021-12-28 DIAGNOSIS — I10 Essential (primary) hypertension: Secondary | ICD-10-CM

## 2021-12-28 DIAGNOSIS — Z01818 Encounter for other preprocedural examination: Secondary | ICD-10-CM

## 2021-12-28 MED ORDER — OLMESARTAN MEDOXOMIL 40 MG PO TABS: ORAL_TABLET | ORAL | 1 refills | Status: DC

## 2021-12-29 ENCOUNTER — Other Ambulatory Visit: Payer: Self-pay | Admitting: Internal Medicine

## 2021-12-29 DIAGNOSIS — E871 Hypo-osmolality and hyponatremia: Secondary | ICD-10-CM

## 2021-12-29 LAB — CBC WITH DIFFERENTIAL/PLATELET
Absolute Monocytes: 678 cells/uL (ref 200–950)
Basophils Absolute: 31 cells/uL (ref 0–200)
Basophils Relative: 0.4 %
Eosinophils Absolute: 154 cells/uL (ref 15–500)
Eosinophils Relative: 2 %
HCT: 34.3 % — ABNORMAL LOW (ref 35.0–45.0)
Hemoglobin: 11.8 g/dL (ref 11.7–15.5)
Lymphs Abs: 2225 cells/uL (ref 850–3900)
MCH: 31.4 pg (ref 27.0–33.0)
MCHC: 34.4 g/dL (ref 32.0–36.0)
MCV: 91.2 fL (ref 80.0–100.0)
MPV: 8.4 fL (ref 7.5–12.5)
Monocytes Relative: 8.8 %
Neutro Abs: 4612 cells/uL (ref 1500–7800)
Neutrophils Relative %: 59.9 %
Platelets: 367 10*3/uL (ref 140–400)
RBC: 3.76 10*6/uL — ABNORMAL LOW (ref 3.80–5.10)
RDW: 12.1 % (ref 11.0–15.0)
Total Lymphocyte: 28.9 %
WBC: 7.7 10*3/uL (ref 3.8–10.8)

## 2021-12-29 LAB — COMPLETE METABOLIC PANEL WITH GFR
AG Ratio: 2 (calc) (ref 1.0–2.5)
ALT: 29 U/L (ref 6–29)
AST: 21 U/L (ref 10–35)
Albumin: 4.6 g/dL (ref 3.6–5.1)
Alkaline phosphatase (APISO): 88 U/L (ref 37–153)
BUN: 14 mg/dL (ref 7–25)
CO2: 25 mmol/L (ref 20–32)
Calcium: 9.8 mg/dL (ref 8.6–10.4)
Chloride: 95 mmol/L — ABNORMAL LOW (ref 98–110)
Creat: 0.93 mg/dL (ref 0.60–0.95)
Globulin: 2.3 g/dL (calc) (ref 1.9–3.7)
Glucose, Bld: 109 mg/dL — ABNORMAL HIGH (ref 65–99)
Potassium: 4.7 mmol/L (ref 3.5–5.3)
Sodium: 129 mmol/L — ABNORMAL LOW (ref 135–146)
Total Bilirubin: 0.6 mg/dL (ref 0.2–1.2)
Total Protein: 6.9 g/dL (ref 6.1–8.1)
eGFR: 62 mL/min/{1.73_m2} (ref >=60)

## 2021-12-29 LAB — HEMOGLOBIN A1C
Hgb A1c MFr Bld: 5.1 % of total Hgb (ref <5.7)
Mean Plasma Glucose: 100 mg/dL
eAG (mmol/L): 5.5 mmol/L

## 2021-12-29 LAB — LIPID PANEL
Cholesterol: 220 mg/dL — ABNORMAL HIGH (ref <200)
HDL: 96 mg/dL (ref >=50)
LDL Cholesterol (Calc): 96 mg/dL (calc)
Non-HDL Cholesterol (Calc): 124 mg/dL (calc) (ref <130)
Total CHOL/HDL Ratio: 2.3 (calc) (ref <5.0)
Triglycerides: 179 mg/dL — ABNORMAL HIGH (ref <150)

## 2021-12-29 LAB — PROTIME-INR
INR: 1
Prothrombin Time: 10.5 s (ref 9.0–11.5)

## 2021-12-29 LAB — MAGNESIUM: Magnesium: 2.1 mg/dL (ref 1.5–2.5)

## 2021-12-29 LAB — TSH: TSH: 1.93 mIU/L (ref 0.40–4.50)

## 2021-12-29 LAB — VITAMIN D 25 HYDROXY (VIT D DEFICIENCY, FRACTURES): Vit D, 25-Hydroxy: 66 ng/mL (ref 30–100)

## 2021-12-29 LAB — INSULIN, RANDOM: Insulin: 20.4 u[IU]/mL — ABNORMAL HIGH

## 2021-12-29 NOTE — Progress Notes (Signed)
<><><><><><><><><><><><><><><><><><><><><><><><><><><><><><><><><> <><><><><><><><><><><><><><><><><><><><><><><><><><><><><><><><><> -   Test results slightly outside the reference range are not unusual. If there is anything important, I will review this with you,  otherwise it is considered normal test values.  If you have further questions,  please do not hesitate to contact me at the office or via My Chart.  <><><><><><><><><><><><><><><><><><><><><><><><><><><><><><><><><> <><><><><><><><><><><><><><><><><><><><><><><><><><><><><><><><><>  -  Sodium is low - So need to liberalize salt intake  &   Will need to monitor closely   - Recommend return to check Urine test for sodium  <><><><><><><><><><><><><><><><><><><><><><><><><><><><><><><><><> <><><><><><><><><><><><><><><><><><><><><><><><><><><><><><><><><>  - Total Chol = 220 is High, but OK since Good HDL Chol = 96 - Very high !   - Bad LDL Chol = 96 is OK since less than 100 ! <><><><><><><><><><><><><><><><><><><><><><><><><><><><><><><><><> <><><><><><><><><><><><><><><><><><><><><><><><><><><><><><><><><>  - ProTime (PT) is Normal OK - shows normal clotting of blood <><><><><><><><><><><><><><><><><><><><><><><><><><><><><><><><><> <><><><><><><><><><><><><><><><><><><><><><><><><><><><><><><><><>  - A1c -Normal - No Diabetes  -  Great  ! <><><><><><><><><><><><><><><><><><><><><><><><><><><><><><><><><> <><><><><><><><><><><><><><><><><><><><><><><><><><><><><><><><><>  - Vitamin D = 66 - Excellent  - Please continue supplements same  <><><><><><><><><><><><><><><><><><><><><><><><><><><><><><><><><> <><><><><><><><><><><><><><><><><><><><><><><><><><><><><><><><><>  - All Else - CBC - Kidneys - Electrolytes - Liver - Magnesium & Thyroid    - all  Normal / OK <><><><><><><><><><><><><><><><><><><><><><><><><><><><><><><><><> <><><><><><><><><><><><><><><><><><><><><><><><><><><><><><><><><>

## 2021-12-30 NOTE — Patient Instructions (Addendum)
SURGICAL WAITING ROOM VISITATION Patients having surgery or a procedure may have no more than 2 support people in the waiting area - these visitors may rotate in the visitor waiting room.   Children under the age of 39 must have an adult with them who is not the patient. If the patient needs to stay at the hospital during part of their recovery, the visitor guidelines for inpatient rooms apply.  PRE-OP VISITATION  Pre-op nurse will coordinate an appropriate time for 1 support person to accompany the patient in pre-op.  This support person may not rotate.  This visitor will be contacted when the time is appropriate for the visitor to come back in the pre-op area.  Please refer to the [facility] website for the visitor guidelines for Inpatients (after your surgery is over and you are in a regular room).  You are not required to quarantine at this time prior to your surgery. However, you must do this: Hand Hygiene often Do NOT share personal items Notify your provider if you are in close contact with someone who has COVID or you develop fever 100.4 or greater, new onset of sneezing, cough, sore throat, shortness of breath or body aches.   If you received a COVID test during your pre-op visit  it is requested that you wear a mask when out in public, stay away from anyone that may not be feeling well and notify your surgeon if you develop symptoms. If you test positive for Covid or have been in contact with anyone that has tested positive in the last 10 days please notify you surgeon.       Your procedure is scheduled on:  Monday   January 17, 2022  Report to [facility] Main Entrance.  Report to admitting at:  12:45 PM  +++++Call this number if you have any questions or problems the morning of surgery [phone]  Do not eat food :After Midnight the night prior to your surgery/procedure.  After Midnight you may have the following liquids until  12:15  PM DAY OF SURGERY  Clear  Liquid Diet Water Black Coffee (sugar ok, NO MILK/CREAM OR CREAMERS)  Tea (sugar ok, NO MILK/CREAM OR CREAMERS) regular and decaf                             Plain Jell-O  with no fruit (NO RED)                                           Fruit ices (not with fruit pulp, NO RED)                                     Popsicles (NO RED)                                                                  Juice: apple, WHITE grape, WHITE cranberry Sports drinks like Gatorade or Powerade (NO RED)  The day of surgery:  Drink ONE (1) Pre-Surgery Clear Ensure  at  12:15  PM the morning of surgery. Drink in one sitting. Do not sip.  This drink was given to you during your hospital pre-op appointment visit. Nothing else to drink after completing the Pre-Surgery Clear Ensure : No candy, chewing gum or throat lozenges.    FOLLOW ANY ADDITIONAL PRE OP INSTRUCTIONS YOU RECEIVED FROM YOUR SURGEON'S OFFICE!!!   Oral Hygiene is also important to reduce your risk of infection.        Remember - BRUSH YOUR TEETH THE MORNING OF SURGERY WITH YOUR REGULAR TOOTHPASTE   Take ONLY these medicines the morning of surgery with A SIP OF WATER: None but you may use your REFRESH Eye drops if needed                   You may not have any metal on your body including hair pins, jewelry, and body piercing  Do not wear make-up, lotions, powders, perfumes, or deodorant  Do not wear nail polish including gel and S&S, artificial / acrylic nails, or any other type of covering on natural nails including finger and toenails. If you have artificial nails, gel coating, etc., that needs to be removed by a nail salon, Please have this removed prior to surgery. Not doing so may mean that your surgery could be cancelled or delayed if the Surgeon or anesthesia staff feels like they are unable to monitor you safely.   Do not shave 48 hours prior to surgery to avoid nicks in your skin which may contribute to  postoperative infections.   Contacts, Hearing Aids, dentures or bridgework may not be worn into surgery.   You may bring a small overnight bag with you on the day of surgery, only pack items that are not valuable .[facility] IS NOT RESPONSIBLE   FOR VALUABLES THAT ARE LOST OR STOLEN.   DO NOT [place]. PHARMACY WILL DISPENSE MEDICATIONS LISTED ON YOUR MEDICATION LIST TO YOU DURING YOUR ADMISSION [place]!   Special Instructions: Bring a copy of your healthcare power of attorney and living will documents the day of surgery, if you wish to have them scanned into your [place] Medical Records- EPIC  Please read over the following fact sheets you were given: IF YOU HAVE QUESTIONS ABOUT YOUR PRE-OP INSTRUCTIONS, PLEASE CALL [phone]  ([place])   [place] - Preparing for Surgery Before surgery, you can play an important role.  Because skin is not sterile, your skin needs to be as free of germs as possible.  You can reduce the number of germs on your skin by washing with CHG (chlorahexidine gluconate) soap before surgery.  CHG is an antiseptic cleaner which kills germs and bonds with the skin to continue killing germs even after washing. Please DO NOT use if you have an allergy to CHG or antibacterial soaps.  If your skin becomes reddened/irritated stop using the CHG and inform your nurse when you arrive at Short Stay. Do not shave (including legs and underarms) for at least 48 hours prior to the first CHG shower.  You may shave your face/neck.  Please follow these instructions carefully:  1.  Shower with CHG Soap the night before surgery and the  morning of surgery.  2.  If you choose to wash your hair, wash your hair first as usual with your normal  shampoo.  3.  After you shampoo, rinse your  hair and body thoroughly to remove the shampoo.                             4.  Use CHG as you would any other liquid soap.  You can apply chg directly to the  skin and wash.  Gently with a scrungie or clean washcloth.  5.  Apply the CHG Soap to your body ONLY FROM THE NECK DOWN.   Do not use on face/ open                           Wound or open sores. Avoid contact with eyes, ears mouth and genitals (private parts).                       Wash face,  Genitals (private parts) with your normal soap.             6.  Wash thoroughly, paying special attention to the area where your  surgery  will be performed.  7.  Thoroughly rinse your body with warm water from the neck down.  8.  DO NOT shower/wash with your normal soap after using and rinsing off the CHG Soap.            9.  Pat yourself dry with a clean towel.            10.  Wear clean pajamas.            11.  Place clean sheets on your bed the night of your first shower and do not  sleep with pets.  ON THE DAY OF SURGERY : Do not apply any lotions/deodorants the morning of surgery.  Please wear clean clothes to the hospital/surgery center.    FAILURE TO FOLLOW THESE INSTRUCTIONS MAY RESULT IN THE CANCELLATION OF YOUR SURGERY  PATIENT SIGNATURE_________________________________  NURSE SIGNATURE__________________________________  ________________________________________________________________________       Adam Phenix    An incentive spirometer is a tool that can help keep your lungs clear and active. This tool measures how well you are filling your lungs with each breath. Taking long deep breaths may help reverse or decrease the chance of developing breathing (pulmonary) problems (especially infection) following: A long period of time when you are unable to move or be active. BEFORE THE PROCEDURE  If the spirometer includes an indicator to show your best effort, your nurse or respiratory therapist will set it to a desired goal. If possible, sit up straight or lean slightly forward. Try not to slouch. Hold the incentive spirometer in an upright position. INSTRUCTIONS FOR USE   Sit on the edge of your bed if possible, or sit up as far as you can in bed or on a chair. Hold the incentive spirometer in an upright position. Breathe out normally. Place the mouthpiece in your mouth and seal your lips tightly around it. Breathe in slowly and as deeply as possible, raising the piston or the ball toward the top of the column. Hold your breath for 3-5 seconds or for as long as possible. Allow the piston or ball to fall to the bottom of the column. Remove the mouthpiece from your mouth and breathe out normally. Rest for a few seconds and repeat Steps 1 through 7 at least 10 times every 1-2 hours when you are awake. Take your time and take a few normal breaths between  deep breaths. The spirometer may include an indicator to show your best effort. Use the indicator as a goal to work toward during each repetition. After each set of 10 deep breaths, practice coughing to be sure your lungs are clear. If you have an incision (the cut made at the time of surgery), support your incision when coughing by placing a pillow or rolled up towels firmly against it. Once you are able to get out of bed, walk around indoors and cough well. You may stop using the incentive spirometer when instructed by your caregiver.  RISKS AND COMPLICATIONS Take your time so you do not get dizzy or light-headed. If you are in pain, you may need to take or ask for pain medication before doing incentive spirometry. It is harder to take a deep breath if you are having pain. AFTER USE Rest and breathe slowly and easily. It can be helpful to keep track of a log of your progress. Your caregiver can provide you with a simple table to help with this. If you are using the spirometer at home, follow these instructions: [place] IF:  You are having difficultly using the spirometer. You have trouble using the spirometer as often as instructed. Your pain medication is not giving enough relief while using the  spirometer. You develop fever of 100.5 F (38.1 C) or higher.                                                                                                    SEEK IMMEDIATE MEDICAL CARE IF:  You cough up bloody sputum that had not been present before. You develop fever of 102 F (38.9 C) or greater. You develop worsening pain at or near the incision site. MAKE SURE YOU:  Understand these instructions. Will watch your condition. Will get help right away if you are not doing well or get worse. Document Released: 07/25/2006 Document Revised: 06/06/2011 Document Reviewed: 09/25/2006 [facility] Patient Information 2014 [place], [place].

## 2021-12-30 NOTE — Progress Notes (Addendum)
COVID Vaccine received:  '[]'$  No '[x]'$  Yes Date of any COVID positive Test in last 90 days: None  PCP - Unk Pinto, MD Cardiologist - n/a  Chest x-ray - n/a EKG -  04-21-2021  Epic Stress Test - n/a ECHO - n/a Cardiac Cath - n/a CT Cardiac calcium score- (49.2)  12-14-2021  Epic  Pacemaker/ICD device     '[x]'$  N/A Spinal Cord Stimulator:'[x]'$  No '[]'$  Yes      (Remind patient to bring remote DOS) Other Implants:   History of Sleep Apnea? '[x]'$  No '[]'$  Yes   Sleep Study Date:   CPAP used?- '[x]'$  No '[]'$  Yes  (Instruct to bring their mask & Tubing)  Does the patient monitor blood sugar? '[]'$  No '[]'$  Yes  '[x]'$  N/A Comments: History has Pre-DM listed but for the last 2 years her Hgb A1Cs have been normal. On  12-28-2021, it was 5.1. Patient she could not remember being told she had Pre-DM diagnosis.   Blood Thinner Instructions: none Aspirin Instructions: ASA 81 mg Last Dose:  stopped Tuesday 01-11-22   ERAS Protocol Ordered: '[]'$  No  '[x]'$  Yes PRE-SURGERY '[x]'$  ENSURE  '[]'$  G2   Activity level: Patient can climb a flight of stairs without difficulty; '[x]'$  No CP  '[x]'$  No SOB,  but would have _Hip pain_   Anesthesia review: 12-28-2021 CMP - sodium = 129 (this was addressed by Dr. Melford Aase).  01-03-22 Had Urine sodium of 20. This was discussed with both Lyndon Code, PA and Jasmine Pang, RN and both said that Anesthesia will decide DOS on whether patient will need an iSTAT drawn. Patient is aware.   Patient denies shortness of breath, fever, cough and chest pain at PAT appointment.  Patient verbalized understanding and agreement to the Pre-Surgical Instructions that were given to them at this PAT appointment. Patient was also educated of the need to review these PAT instructions again prior to his/her surgery.I reviewed the appropriate phone numbers to call if they have any and questions or concerns.

## 2022-01-03 ENCOUNTER — Ambulatory Visit (INDEPENDENT_AMBULATORY_CARE_PROVIDER_SITE_OTHER): Payer: PPO

## 2022-01-03 DIAGNOSIS — E871 Hypo-osmolality and hyponatremia: Secondary | ICD-10-CM | POA: Diagnosis not present

## 2022-01-04 ENCOUNTER — Other Ambulatory Visit: Payer: Self-pay | Admitting: Internal Medicine

## 2022-01-04 DIAGNOSIS — E871 Hypo-osmolality and hyponatremia: Secondary | ICD-10-CM

## 2022-01-04 LAB — SODIUM, URINE, RANDOM: Sodium, Ur: 20 mmol/L — ABNORMAL LOW (ref 28–272)

## 2022-01-04 NOTE — Progress Notes (Signed)
<><><><><><><><><><><><><><><><><><><><><><><><><><><><><><><><><> <><><><><><><><><><><><><><><><><><><><><><><><><><><><><><><><><>  -   Urine sodium is Low & OK   - Suggest increase salt / sodium in diet  &                                  recheck blood test  ( electrolytes - sodium  ) in 1 month   - So please call office to schedule a Nurse visit in 1 month to recheck Sodium level   <><><><><><><><><><><><><><><><><><><><><><><><><><><><><><><><><> <><><><><><><><><><><><><><><><><><><><><><><><><><><><><><><><><>

## 2022-01-06 ENCOUNTER — Ambulatory Visit: Payer: PPO | Admitting: Internal Medicine

## 2022-01-10 ENCOUNTER — Encounter (HOSPITAL_COMMUNITY)
Admission: RE | Admit: 2022-01-10 | Discharge: 2022-01-10 | Disposition: A | Discharge status: Home / Self Care | Payer: PPO | Source: Ambulatory Visit | Attending: Orthopedic Surgery | Admitting: Orthopedic Surgery

## 2022-01-10 ENCOUNTER — Other Ambulatory Visit: Payer: Self-pay

## 2022-01-10 ENCOUNTER — Encounter (HOSPITAL_COMMUNITY): Payer: Self-pay

## 2022-01-10 VITALS — BP 150/93 | HR 72 | Temp 98.2°F | Resp 18 | Ht 62.0 in | Wt 140.0 lb

## 2022-01-10 DIAGNOSIS — Z01818 Encounter for other preprocedural examination: Secondary | ICD-10-CM | POA: Insufficient documentation

## 2022-01-10 HISTORY — DX: Malignant (primary) neoplasm, unspecified: C80.1

## 2022-01-10 LAB — SURGICAL PCR SCREEN
MRSA, PCR: NEGATIVE
Staphylococcus aureus: NEGATIVE

## 2022-01-17 ENCOUNTER — Encounter (HOSPITAL_COMMUNITY): Payer: Self-pay | Admitting: Orthopedic Surgery

## 2022-01-17 ENCOUNTER — Other Ambulatory Visit: Payer: Self-pay

## 2022-01-17 ENCOUNTER — Observation Stay (HOSPITAL_COMMUNITY)
Admission: RE | Admit: 2022-01-17 | Discharge: 2022-01-18 | Disposition: A | Discharge status: Home / Self Care | Payer: PPO | Source: Ambulatory Visit | Attending: Orthopedic Surgery | Admitting: Orthopedic Surgery

## 2022-01-17 ENCOUNTER — Ambulatory Visit (HOSPITAL_COMMUNITY): Payer: PPO

## 2022-01-17 ENCOUNTER — Encounter (HOSPITAL_COMMUNITY)
Admission: RE | Disposition: A | Discharge status: Home / Self Care | Payer: Self-pay | Source: Ambulatory Visit | Attending: Orthopedic Surgery

## 2022-01-17 ENCOUNTER — Ambulatory Visit (HOSPITAL_COMMUNITY): Payer: PPO | Admitting: Physician Assistant

## 2022-01-17 ENCOUNTER — Ambulatory Visit (HOSPITAL_BASED_OUTPATIENT_CLINIC_OR_DEPARTMENT_OTHER): Payer: PPO | Admitting: Physician Assistant

## 2022-01-17 ENCOUNTER — Observation Stay (HOSPITAL_COMMUNITY): Payer: PPO

## 2022-01-17 DIAGNOSIS — Z471 Aftercare following joint replacement surgery: Secondary | ICD-10-CM | POA: Diagnosis not present

## 2022-01-17 DIAGNOSIS — Z87891 Personal history of nicotine dependence: Secondary | ICD-10-CM | POA: Insufficient documentation

## 2022-01-17 DIAGNOSIS — Z79899 Other long term (current) drug therapy: Secondary | ICD-10-CM | POA: Diagnosis not present

## 2022-01-17 DIAGNOSIS — Z85828 Personal history of other malignant neoplasm of skin: Secondary | ICD-10-CM | POA: Insufficient documentation

## 2022-01-17 DIAGNOSIS — Z7982 Long term (current) use of aspirin: Secondary | ICD-10-CM | POA: Diagnosis not present

## 2022-01-17 DIAGNOSIS — Z96641 Presence of right artificial hip joint: Secondary | ICD-10-CM | POA: Diagnosis not present

## 2022-01-17 DIAGNOSIS — Z96652 Presence of left artificial knee joint: Secondary | ICD-10-CM | POA: Insufficient documentation

## 2022-01-17 DIAGNOSIS — I10 Essential (primary) hypertension: Secondary | ICD-10-CM | POA: Insufficient documentation

## 2022-01-17 DIAGNOSIS — Z96612 Presence of left artificial shoulder joint: Secondary | ICD-10-CM | POA: Insufficient documentation

## 2022-01-17 DIAGNOSIS — Z96611 Presence of right artificial shoulder joint: Secondary | ICD-10-CM | POA: Diagnosis not present

## 2022-01-17 DIAGNOSIS — M1611 Unilateral primary osteoarthritis, right hip: Principal | ICD-10-CM | POA: Insufficient documentation

## 2022-01-17 DIAGNOSIS — M169 Osteoarthritis of hip, unspecified: Secondary | ICD-10-CM | POA: Diagnosis present

## 2022-01-17 HISTORY — PX: TOTAL HIP ARTHROPLASTY: SHX124

## 2022-01-17 LAB — TYPE AND SCREEN
ABO/RH(D): O POS
Antibody Screen: NEGATIVE

## 2022-01-17 SURGERY — ARTHROPLASTY, HIP, TOTAL, ANTERIOR APPROACH
Anesthesia: Spinal | Site: Hip | Laterality: Right

## 2022-01-17 MED ORDER — DEXAMETHASONE SODIUM PHOSPHATE 10 MG/ML IJ SOLN
8.0000 mg | Freq: Once | INTRAMUSCULAR | Status: AC
  Administered 2022-01-17: 8 mg via INTRAVENOUS

## 2022-01-17 MED ORDER — BISACODYL 10 MG RE SUPP: 10.0000 mg | Freq: Every day | RECTAL | Status: DC | PRN

## 2022-01-17 MED ORDER — HYDROMORPHONE HCL 1 MG/ML IJ SOLN
0.5000 mg | INTRAMUSCULAR | Status: DC | PRN
  Administered 2022-01-18: 1 mg via INTRAVENOUS
  Filled 2022-01-17: qty 1

## 2022-01-17 MED ORDER — PHENYLEPHRINE HCL (PRESSORS) 10 MG/ML IV SOLN
INTRAVENOUS | Status: AC
  Filled 2022-01-17: qty 1

## 2022-01-17 MED ORDER — BUPIVACAINE-EPINEPHRINE (PF) 0.25% -1:200000 IJ SOLN
INTRAMUSCULAR | Status: DC | PRN
  Administered 2022-01-17: 30 mL

## 2022-01-17 MED ORDER — ORAL CARE MOUTH RINSE: 15.0000 mL | Freq: Once | OROMUCOSAL | Status: AC

## 2022-01-17 MED ORDER — DOCUSATE SODIUM 100 MG PO CAPS
100.0000 mg | ORAL_CAPSULE | Freq: Two times a day (BID) | ORAL | Status: DC
  Administered 2022-01-17 – 2022-01-18 (×2): 100 mg via ORAL
  Filled 2022-01-17 (×2): qty 1

## 2022-01-17 MED ORDER — METHOCARBAMOL 500 MG IVPB - SIMPLE MED: 500.0000 mg | Freq: Four times a day (QID) | INTRAVENOUS | Status: DC | PRN

## 2022-01-17 MED ORDER — METOCLOPRAMIDE HCL 5 MG/ML IJ SOLN: 5.0000 mg | Freq: Three times a day (TID) | INTRAMUSCULAR | Status: DC | PRN

## 2022-01-17 MED ORDER — LACTATED RINGERS IV SOLN: INTRAVENOUS | Status: DC

## 2022-01-17 MED ORDER — IRBESARTAN 150 MG PO TABS
300.0000 mg | ORAL_TABLET | Freq: Every day | ORAL | Status: DC
  Administered 2022-01-18: 300 mg via ORAL
  Filled 2022-01-17 (×2): qty 2

## 2022-01-17 MED ORDER — DIPHENHYDRAMINE HCL 12.5 MG/5ML PO ELIX: 12.5000 mg | ORAL_SOLUTION | ORAL | Status: DC | PRN

## 2022-01-17 MED ORDER — DEXAMETHASONE SODIUM PHOSPHATE 10 MG/ML IJ SOLN
INTRAMUSCULAR | Status: AC
  Filled 2022-01-17: qty 1

## 2022-01-17 MED ORDER — MONTELUKAST SODIUM 10 MG PO TABS: 10.0000 mg | ORAL_TABLET | Freq: Every day | ORAL | Status: DC | PRN

## 2022-01-17 MED ORDER — ACETAMINOPHEN 500 MG PO TABS: 1000.0000 mg | ORAL_TABLET | Freq: Once | ORAL | Status: DC

## 2022-01-17 MED ORDER — CEFAZOLIN SODIUM-DEXTROSE 2-4 GM/100ML-% IV SOLN
2.0000 g | INTRAVENOUS | Status: AC
  Administered 2022-01-17: 2 g via INTRAVENOUS
  Filled 2022-01-17: qty 100

## 2022-01-17 MED ORDER — BUPIVACAINE-EPINEPHRINE (PF) 0.25% -1:200000 IJ SOLN
INTRAMUSCULAR | Status: AC
  Filled 2022-01-17: qty 30

## 2022-01-17 MED ORDER — ALBUTEROL SULFATE (2.5 MG/3ML) 0.083% IN NEBU: 3.0000 mL | INHALATION_SOLUTION | Freq: Four times a day (QID) | RESPIRATORY_TRACT | Status: DC | PRN

## 2022-01-17 MED ORDER — POLYETHYLENE GLYCOL 3350 17 G PO PACK: 17.0000 g | PACK | Freq: Every day | ORAL | Status: DC | PRN

## 2022-01-17 MED ORDER — ONDANSETRON HCL 4 MG PO TABS: 4.0000 mg | ORAL_TABLET | Freq: Four times a day (QID) | ORAL | Status: DC | PRN

## 2022-01-17 MED ORDER — PHENYLEPHRINE 80 MCG/ML (10ML) SYRINGE FOR IV PUSH (FOR BLOOD PRESSURE SUPPORT)
PREFILLED_SYRINGE | INTRAVENOUS | Status: AC
  Filled 2022-01-17: qty 10

## 2022-01-17 MED ORDER — DEXAMETHASONE SODIUM PHOSPHATE 10 MG/ML IJ SOLN
10.0000 mg | Freq: Once | INTRAMUSCULAR | Status: AC
  Administered 2022-01-18: 10 mg via INTRAVENOUS
  Filled 2022-01-17: qty 1

## 2022-01-17 MED ORDER — ONDANSETRON HCL 4 MG/2ML IJ SOLN
INTRAMUSCULAR | Status: DC | PRN
  Administered 2022-01-17: 4 mg via INTRAVENOUS

## 2022-01-17 MED ORDER — POLYVINYL ALCOHOL 1.4 % OP SOLN: 1.0000 [drp] | Freq: Three times a day (TID) | OPHTHALMIC | Status: DC | PRN

## 2022-01-17 MED ORDER — PHENYLEPHRINE 80 MCG/ML (10ML) SYRINGE FOR IV PUSH (FOR BLOOD PRESSURE SUPPORT)
PREFILLED_SYRINGE | INTRAVENOUS | Status: DC | PRN
  Administered 2022-01-17 (×6): 80 ug via INTRAVENOUS
  Administered 2022-01-17: 160 ug via INTRAVENOUS

## 2022-01-17 MED ORDER — PHENYLEPHRINE HCL-NACL 20-0.9 MG/250ML-% IV SOLN
INTRAVENOUS | Status: DC | PRN
  Administered 2022-01-17: 50 ug/min via INTRAVENOUS

## 2022-01-17 MED ORDER — METOCLOPRAMIDE HCL 5 MG PO TABS: 5.0000 mg | ORAL_TABLET | Freq: Three times a day (TID) | ORAL | Status: DC | PRN

## 2022-01-17 MED ORDER — EPHEDRINE SULFATE-NACL 50-0.9 MG/10ML-% IV SOSY
PREFILLED_SYRINGE | INTRAVENOUS | Status: DC | PRN
  Administered 2022-01-17: 5 mg via INTRAVENOUS

## 2022-01-17 MED ORDER — ONDANSETRON HCL 4 MG/2ML IJ SOLN: 4.0000 mg | Freq: Once | INTRAMUSCULAR | Status: DC | PRN

## 2022-01-17 MED ORDER — WATER FOR IRRIGATION, STERILE IR SOLN
Status: DC | PRN
  Administered 2022-01-17: 2000 mL

## 2022-01-17 MED ORDER — MENTHOL 3 MG MT LOZG: 1.0000 | LOZENGE | OROMUCOSAL | Status: DC | PRN

## 2022-01-17 MED ORDER — FLEET ENEMA 7-19 GM/118ML RE ENEM: 1.0000 | ENEMA | Freq: Once | RECTAL | Status: DC | PRN

## 2022-01-17 MED ORDER — 0.9 % SODIUM CHLORIDE (POUR BTL) OPTIME
TOPICAL | Status: DC | PRN
  Administered 2022-01-17: 1000 mL

## 2022-01-17 MED ORDER — TRANEXAMIC ACID-NACL 1000-0.7 MG/100ML-% IV SOLN
1000.0000 mg | INTRAVENOUS | Status: AC
  Administered 2022-01-17: 1000 mg via INTRAVENOUS
  Filled 2022-01-17: qty 100

## 2022-01-17 MED ORDER — SODIUM CHLORIDE 0.9 % IV SOLN: INTRAVENOUS | Status: DC

## 2022-01-17 MED ORDER — ONDANSETRON HCL 4 MG/2ML IJ SOLN: 4.0000 mg | Freq: Four times a day (QID) | INTRAMUSCULAR | Status: DC | PRN

## 2022-01-17 MED ORDER — PHENOL 1.4 % MT LIQD: 1.0000 | OROMUCOSAL | Status: DC | PRN

## 2022-01-17 MED ORDER — CEFAZOLIN SODIUM-DEXTROSE 2-4 GM/100ML-% IV SOLN
2.0000 g | Freq: Four times a day (QID) | INTRAVENOUS | Status: AC
  Administered 2022-01-17 – 2022-01-18 (×2): 2 g via INTRAVENOUS
  Filled 2022-01-17 (×2): qty 100

## 2022-01-17 MED ORDER — TRAMADOL HCL 50 MG PO TABS
50.0000 mg | ORAL_TABLET | Freq: Four times a day (QID) | ORAL | Status: DC | PRN
  Administered 2022-01-17 – 2022-01-18 (×2): 100 mg via ORAL
  Filled 2022-01-17 (×2): qty 2

## 2022-01-17 MED ORDER — POVIDONE-IODINE 10 % EX SWAB
2.0000 | Freq: Once | CUTANEOUS | Status: AC
  Administered 2022-01-17: 2 via TOPICAL

## 2022-01-17 MED ORDER — PROPOFOL 500 MG/50ML IV EMUL
INTRAVENOUS | Status: DC | PRN
  Administered 2022-01-17: 50 ug/kg/min via INTRAVENOUS

## 2022-01-17 MED ORDER — BUPIVACAINE IN DEXTROSE 0.75-8.25 % IT SOLN
INTRATHECAL | Status: DC | PRN
  Administered 2022-01-17: 1.4 mL via INTRATHECAL

## 2022-01-17 MED ORDER — CHLORHEXIDINE GLUCONATE 0.12 % MT SOLN
15.0000 mL | Freq: Once | OROMUCOSAL | Status: AC
  Administered 2022-01-17: 15 mL via OROMUCOSAL

## 2022-01-17 MED ORDER — ACETAMINOPHEN 325 MG PO TABS: 325.0000 mg | ORAL_TABLET | Freq: Four times a day (QID) | ORAL | Status: DC | PRN

## 2022-01-17 MED ORDER — PROPOFOL 1000 MG/100ML IV EMUL
INTRAVENOUS | Status: AC
  Filled 2022-01-17: qty 100

## 2022-01-17 MED ORDER — PROPOFOL 10 MG/ML IV BOLUS
INTRAVENOUS | Status: DC | PRN
  Administered 2022-01-17 (×4): 30 mg via INTRAVENOUS

## 2022-01-17 MED ORDER — ACETAMINOPHEN 500 MG PO TABS
1000.0000 mg | ORAL_TABLET | Freq: Four times a day (QID) | ORAL | Status: DC
  Administered 2022-01-17 – 2022-01-18 (×3): 1000 mg via ORAL
  Filled 2022-01-17 (×3): qty 2

## 2022-01-17 MED ORDER — ACETAMINOPHEN 10 MG/ML IV SOLN
1000.0000 mg | Freq: Once | INTRAVENOUS | Status: AC
  Administered 2022-01-17: 1000 mg via INTRAVENOUS
  Filled 2022-01-17: qty 100

## 2022-01-17 MED ORDER — HYDROMORPHONE HCL 2 MG PO TABS
2.0000 mg | ORAL_TABLET | ORAL | Status: DC | PRN
  Administered 2022-01-17 – 2022-01-18 (×2): 2 mg via ORAL
  Filled 2022-01-17 (×2): qty 1

## 2022-01-17 MED ORDER — METHOCARBAMOL 500 MG PO TABS
500.0000 mg | ORAL_TABLET | Freq: Four times a day (QID) | ORAL | Status: DC | PRN
  Administered 2022-01-17: 500 mg via ORAL
  Filled 2022-01-17 (×2): qty 1

## 2022-01-17 MED ORDER — ONDANSETRON HCL 4 MG/2ML IJ SOLN
INTRAMUSCULAR | Status: AC
  Filled 2022-01-17: qty 2

## 2022-01-17 MED ORDER — FENTANYL CITRATE PF 50 MCG/ML IJ SOSY: 25.0000 ug | PREFILLED_SYRINGE | INTRAMUSCULAR | Status: DC | PRN

## 2022-01-17 MED ORDER — ASPIRIN 325 MG PO TBEC
325.0000 mg | DELAYED_RELEASE_TABLET | Freq: Two times a day (BID) | ORAL | Status: DC
  Administered 2022-01-18: 325 mg via ORAL
  Filled 2022-01-17: qty 1

## 2022-01-17 SURGICAL SUPPLY — 41 items
BAG COUNTER SPONGE SURGICOUNT (BAG) IMPLANT
BAG DECANTER FOR FLEXI CONT (MISCELLANEOUS) IMPLANT
BAG ZIPLOCK 12X15 (MISCELLANEOUS) IMPLANT
BLADE SAG 18X100X1.27 (BLADE) ×1 IMPLANT
COVER PERINEAL POST (MISCELLANEOUS) ×1 IMPLANT
COVER SURGICAL LIGHT HANDLE (MISCELLANEOUS) ×1 IMPLANT
CUP ACETBLR 48 OD SECTOR II (Hips) IMPLANT
DRAPE FOOT SWITCH (DRAPES) ×1 IMPLANT
DRAPE STERI IOBAN 125X83 (DRAPES) ×1 IMPLANT
DRAPE U-SHAPE 47X51 STRL (DRAPES) ×2 IMPLANT
DRSG AQUACEL AG ADV 3.5X10 (GAUZE/BANDAGES/DRESSINGS) ×1 IMPLANT
DURAPREP 26ML APPLICATOR (WOUND CARE) ×1 IMPLANT
ELECT REM PT RETURN 15FT ADLT (MISCELLANEOUS) ×1 IMPLANT
GLOVE BIO SURGEON STRL SZ 6.5 (GLOVE) IMPLANT
GLOVE BIO SURGEON STRL SZ7.5 (GLOVE) IMPLANT
GLOVE BIO SURGEON STRL SZ8 (GLOVE) ×1 IMPLANT
GLOVE BIOGEL PI IND STRL 6.5 (GLOVE) IMPLANT
GLOVE BIOGEL PI IND STRL 7.0 (GLOVE) IMPLANT
GLOVE BIOGEL PI IND STRL 8 (GLOVE) ×1 IMPLANT
GOWN STRL REUS W/ TWL LRG LVL3 (GOWN DISPOSABLE) ×1 IMPLANT
GOWN STRL REUS W/ TWL XL LVL3 (GOWN DISPOSABLE) IMPLANT
GOWN STRL REUS W/TWL LRG LVL3 (GOWN DISPOSABLE) ×1
GOWN STRL REUS W/TWL XL LVL3 (GOWN DISPOSABLE) ×2
HEAD FEM STD 28X+1.5 STRL (Hips) IMPLANT
HOLDER FOLEY CATH W/STRAP (MISCELLANEOUS) ×1 IMPLANT
KIT TURNOVER KIT A (KITS) IMPLANT
LINER MARATHON 28 48 (Hips) IMPLANT
MANIFOLD NEPTUNE II (INSTRUMENTS) ×1 IMPLANT
PACK ANTERIOR HIP CUSTOM (KITS) ×1 IMPLANT
PENCIL SMOKE EVACUATOR COATED (MISCELLANEOUS) ×1 IMPLANT
SPIKE FLUID TRANSFER (MISCELLANEOUS) ×1 IMPLANT
STEM FEMORAL SZ 6MM STD ACTIS (Stem) IMPLANT
STRIP CLOSURE SKIN 1/2X4 (GAUZE/BANDAGES/DRESSINGS) ×1 IMPLANT
SUT ETHIBOND NAB CT1 #1 30IN (SUTURE) ×1 IMPLANT
SUT MNCRL AB 4-0 PS2 18 (SUTURE) ×1 IMPLANT
SUT STRATAFIX 0 PDS 27 VIOLET (SUTURE) ×1
SUT VIC AB 2-0 CT1 27 (SUTURE) ×2
SUT VIC AB 2-0 CT1 TAPERPNT 27 (SUTURE) ×2 IMPLANT
SUTURE STRATFX 0 PDS 27 VIOLET (SUTURE) ×1 IMPLANT
TRAY FOLEY MTR SLVR 16FR STAT (SET/KITS/TRAYS/PACK) ×1 IMPLANT
TUBE SUCTION HIGH CAP CLEAR NV (SUCTIONS) ×1 IMPLANT

## 2022-01-17 NOTE — Anesthesia Procedure Notes (Signed)
Spinal  Patient location during procedure: OR Start time: 01/17/2022 1:57 PM End time: 01/17/2022 2:00 PM Reason for block: surgical anesthesia Staffing Performed: anesthesiologist  Anesthesiologist: Audry Pili, MD Performed by: Audry Pili, MD Authorized by: Audry Pili, MD   Preanesthetic Checklist Completed: patient identified, IV checked, risks and benefits discussed, surgical consent, monitors and equipment checked, pre-op evaluation and timeout performed Spinal Block Patient position: sitting Prep: DuraPrep Patient monitoring: heart rate, cardiac monitor, continuous pulse ox and blood pressure Approach: midline Location: L3-4 Injection technique: single-shot Needle Needle type: Pencan  Needle gauge: 24 G Additional Notes Consent was obtained prior to the procedure with all questions answered and concerns addressed. Risks including, but not limited to, bleeding, infection, nerve damage, paralysis, failed block, inadequate analgesia, allergic reaction, high spinal, itching, and headache were discussed and the patient wished to proceed. Functioning IV was confirmed and monitors were applied. Sterile prep and drape, including hand hygiene, mask, and sterile gloves were used. The patient was positioned and the spine was prepped. The skin was anesthetized with lidocaine. Free flow of clear CSF was obtained prior to injecting local anesthetic into the CSF. The spinal needle aspirated freely following injection. The needle was carefully withdrawn. The patient tolerated the procedure well.   Renold Don, MD

## 2022-01-17 NOTE — Care Plan (Signed)
Ortho Bundle Case Management Note  Patient Details  Name: Michaela Kelley MRN: [id number] Date of Birth: October 14, 1940  R THA on 01-17-22 DCP:  Home with dtr DME:  No needs, has a RW PT:  HEP                   DME Arranged:  N/A DME Agency:  NA  HH Arranged:  NA HH Agency:  NA  Additional Comments: Please contact me with any questions of if this plan should need to change.  Marianne Sofia, RN,CCM [facility]  [phone] 01/17/2022, 4:34 PM

## 2022-01-17 NOTE — Anesthesia Preprocedure Evaluation (Addendum)
Anesthesia Evaluation  Patient identified by MRN, date of birth, ID band Patient awake    Reviewed: Allergy & Precautions, NPO status , Patient's Chart, lab work & pertinent test results  History of Anesthesia Complications Negative for: history of anesthetic complications  Airway Mallampati: II  TM Distance: >3 FB Neck ROM: Full    Dental  (+) Dental Advisory Given, Teeth Intact   Pulmonary asthma , former smoker,    Pulmonary exam normal        Cardiovascular hypertension, Pt. on medications Normal cardiovascular exam     Neuro/Psych negative neurological ROS  negative psych ROS   GI/Hepatic Neg liver ROS, GERD  ,  Endo/Other  negative endocrine ROS  Renal/GU negative Renal ROS     Musculoskeletal  (+) Arthritis ,   Abdominal   Peds  Hematology negative hematology ROS (+)   Anesthesia Other Findings   Reproductive/Obstetrics                            Anesthesia Physical Anesthesia Plan  ASA: 2  Anesthesia Plan: Spinal   Post-op Pain Management: Tylenol PO (pre-op)*   Induction:   PONV Risk Score and Plan: 2 and Treatment may vary due to age or medical condition and Propofol infusion  Airway Management Planned: Natural Airway and Simple Face Mask  Additional Equipment: None  Intra-op Plan:   Post-operative Plan:   Informed Consent: I have reviewed the patients History and Physical, chart, labs and discussed the procedure including the risks, benefits and alternatives for the proposed anesthesia with the patient or authorized representative who has indicated his/her understanding and acceptance.       Plan Discussed with: CRNA and Anesthesiologist  Anesthesia Plan Comments: (Labs reviewed, platelets acceptable. Discussed risks and benefits of spinal, including spinal/epidural hematoma, infection, failed block, and PDPH. Patient expressed understanding and wished to  proceed. )       Anesthesia Quick Evaluation

## 2022-01-17 NOTE — Op Note (Signed)
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   POSTOPERATIVE DIAGNOSIS: Osteoarthritis of the Right  hip.   PROCEDURE: Right total hip arthroplasty, anterior approach.   SURGEON: Gaynelle Arabian, MD   ASSISTANT: Molli Barrows, PA-C  ANESTHESIA:  Spinal  ESTIMATED BLOOD LOSS:-200 mL    DRAINS: None  COMPLICATIONS: None   CONDITION: PACU - hemodynamically stable.   BRIEF CLINICAL NOTE: Michaela Kelley is a 81 y.o. female who has advanced end-  stage arthritis of their Right  hip with progressively worsening pain and  dysfunction.The patient has failed nonoperative management and presents for  total hip arthroplasty.   PROCEDURE IN DETAIL: After successful administration of spinal  anesthetic, the traction boots for the [facility] bed were placed on both  feet and the patient was placed onto the [facility] bed, boots placed into the leg  holders. The Right hip was then isolated from the perineum with plastic  drapes and prepped and draped in the usual sterile fashion. ASIS and  greater trochanter were marked and a oblique incision was made, starting  at about 1 cm lateral and 2 cm distal to the ASIS and coursing towards  the anterior cortex of the femur. The skin was cut with a 10 blade  through subcutaneous tissue to the level of the fascia overlying the  tensor fascia lata muscle. The fascia was then incised in line with the  incision at the junction of the anterior third and posterior 2/3rd. The  muscle was teased off the fascia and then the interval between the TFL  and the rectus was developed. The Hohmann retractor was then placed at  the top of the femoral neck over the capsule. The vessels overlying the  capsule were cauterized and the fat on top of the capsule was removed.  A Hohmann retractor was then placed anterior underneath the rectus  femoris to give exposure to the entire anterior capsule. A T-shaped  capsulotomy  was performed. The edges were tagged and the femoral head  was identified.       Osteophytes are removed off the superior acetabulum.  The femoral neck was then cut in situ with an oscillating saw. Traction  was then applied to the left lower extremity utilizing the [facility]  traction. The femoral head was then removed. Retractors were placed  around the acetabulum and then circumferential removal of the labrum was  performed. Osteophytes were also removed. Reaming starts at 45 mm to  medialize and  Increased in 2 mm increments to 47 mm. We reamed in  approximately 40 degrees of abduction, 20 degrees anteversion. A 48 mm  pinnacle acetabular shell was then impacted in anatomic position under  fluoroscopic guidance with excellent purchase. We did not need to place  any additional dome screws. A 28 mm neutral + 4 marathon liner was then  placed into the acetabular shell.       The femoral lift was then placed along the lateral aspect of the femur  just distal to the vastus ridge. The leg was  externally rotated and capsule  was stripped off the inferior aspect of the femoral neck down to the  level of the lesser trochanter, this was done with electrocautery. The femur was lifted after this was performed. The  leg was then placed in an extended and adducted position essentially delivering the femur. We also removed the capsule superiorly and the piriformis from the piriformis fossa to  gain excellent exposure of the  proximal femur. Rongeur was used to remove some cancellous bone to get  into the lateral portion of the proximal femur for placement of the  initial starter reamer. The starter broaches was placed  the starter broach  and was shown to go down the center of the canal. Broaching  with the Actis system was then performed starting at size 0  coursing  Up to size 6. A size 6 had excellent torsional and rotational  and axial stability. The trial standard offset neck was then placed  with a 28  + 1.5 trial head. The hip was then reduced. We confirmed that  the stem was in the canal both on AP and lateral x-rays. It also has excellent sizing. The hip was reduced with outstanding stability through full extension and full external rotation.. AP pelvis was taken and the leg lengths were measured and found to be equal. Hip was then dislocated again and the femoral head and neck removed. The  femoral broach was removed. Size 6 Actis stem with a standard offset  neck was then impacted into the femur following native anteversion. Has  excellent purchase in the canal. Excellent torsional and rotational and  axial stability. It is confirmed to be in the canal on AP and lateral  fluoroscopic views. The 28 + 1.5 metal head was placed and the hip  reduced with outstanding stability. Again AP pelvis was taken and it  confirmed that the leg lengths were equal. The wound was then copiously  irrigated with saline solution and the capsule reattached and repaired  with Ethibond suture. 30 ml of .25% Bupivicaine was  injected into the capsule and into the edge of the tensor fascia lata as well as subcutaneous tissue. The fascia overlying the tensor fascia lata was then closed with a running #1 V-Loc. Subcu was closed with interrupted 2-0 Vicryl and subcuticular running 4-0 Monocryl. Incision was cleaned  and dried. Steri-Strips and a bulky sterile dressing applied. The patient was awakened and transported to  recovery in stable condition.        Please note that a surgical assistant was a medical necessity for this procedure to perform it in a safe and expeditious manner. Assistant was necessary to provide appropriate retraction of vital neurovascular structures and to prevent femoral fracture and allow for anatomic placement of the prosthesis.  Gaynelle Arabian, M.D.

## 2022-01-17 NOTE — Transfer of Care (Signed)
Immediate Anesthesia Transfer of Care Note  Patient: Michaela Kelley  Procedure(s) Performed: TOTAL HIP ARTHROPLASTY ANTERIOR APPROACH (Right: Hip)  Patient Location: PACU  Anesthesia Type:Spinal  Level of Consciousness: sedated  Airway & Oxygen Therapy: Patient Spontanous Breathing and Patient connected to face mask oxygen  Post-op Assessment: Report given to RN and Post -op Vital signs reviewed and stable  Post vital signs: Reviewed and stable  Last Vitals:  Vitals Value Taken Time  BP 99/59 01/17/22 1532  Temp    Pulse 72 01/17/22 1533  Resp 10 01/17/22 1533  SpO2 100 % 01/17/22 1533  Vitals shown include unvalidated device data.  Last Pain:  Vitals:   01/17/22 1211  TempSrc:   PainSc: 5       Patients Stated Pain Goal: 4 (50/27/74 1287)  Complications: No notable events documented.

## 2022-01-17 NOTE — Anesthesia Procedure Notes (Signed)
Procedure Name: MAC Date/Time: 01/17/2022 1:54 PM  Performed by: Deliah Boston, CRNAPre-anesthesia Checklist: Patient identified, Emergency Drugs available, Suction available and Patient being monitored Patient Re-evaluated:Patient Re-evaluated prior to induction Oxygen Delivery Method: Simple face mask Placement Confirmation: positive ETCO2 and breath sounds checked- equal and bilateral

## 2022-01-17 NOTE — Discharge Instructions (Addendum)
Michaela Arabian, MD Total Joint Specialist [facility] [address], [address] [place], [place] [address] [phone]  ANTERIOR APPROACH TOTAL HIP REPLACEMENT POSTOPERATIVE DIRECTIONS     Hip Rehabilitation, Guidelines Following Surgery  The results of a hip operation are greatly improved after range of motion and muscle strengthening exercises. Follow all safety measures which are given to protect your hip. If any of these exercises cause increased pain or swelling in your joint, decrease the amount until you are comfortable again. Then slowly increase the exercises. Call your caregiver if you have problems or questions.   BLOOD CLOT PREVENTION Take a 325 mg Aspirin two times a day for three weeks following surgery. Then resume an 81 mg Aspirin once a day You may resume your vitamins/supplements upon discharge from the hospital. Do not take any NSAIDs (Advil, Aleve, Ibuprofen, Meloxicam, etc.) until you have discontinued the 325 mg Aspirin.  HOME CARE INSTRUCTIONS  Remove items at home which could result in a fall. This includes throw rugs or furniture in walking pathways.  ICE to the affected hip as frequently as 20-30 minutes an hour and then as needed for pain and swelling. Continue to use ice on the hip for pain and swelling from surgery. You may notice swelling that will progress down to the foot and ankle. This is normal after surgery. Elevate the leg when you are not up walking on it.   Continue to use the breathing machine which will help keep your temperature down.  It is common for your temperature to cycle up and down following surgery, especially at night when you are not up moving around and exerting yourself.  The breathing machine keeps your lungs expanded and your temperature down.  DIET You may resume your previous home diet once your are discharged from the hospital.  DRESSING / WOUND CARE / SHOWERING You have an adhesive waterproof bandage over the  incision. Leave this in place until your first follow-up appointment. Once you remove this you will not need to place another bandage.  You may begin showering 3 days following surgery, but do not submerge the incision under water.  ACTIVITY For the first 3-5 days, it is important to rest and keep the operative leg elevated. You should, as a general rule, rest for 50 minutes and walk/stretch for 10 minutes per hour. After 5 days, you may slowly increase activity as tolerated.  Perform the exercises you were provided twice a day for about 15-20 minutes each session. Begin these 2 days following surgery. Walk with your walker as instructed. Use the walker until you are comfortable transitioning to a cane. Walk with the cane in the opposite hand of the operative leg. You may discontinue the cane once you are comfortable and walking steadily. Avoid periods of inactivity such as sitting longer than an hour when not asleep. This helps prevent blood clots.  Do not drive a car for 6 weeks or until released by your surgeon.  Do not drive while taking narcotics.  TED HOSE STOCKINGS Wear the elastic stockings on both legs for three weeks following surgery during the day. You may remove them at night while sleeping.  WEIGHT BEARING Weight bearing as tolerated with assist device (walker, cane, etc) as directed, use it as long as suggested by your surgeon or therapist, typically at least 4-6 weeks.  POSTOPERATIVE CONSTIPATION PROTOCOL Constipation - defined medically as fewer than three stools per week and severe constipation as less than one stool per week.  less than one stool per week.  One of the most common issues patients have following surgery is constipation.  Even if you have a regular bowel pattern at home, your normal regimen is likely to be disrupted due to multiple reasons following surgery.  Combination of anesthesia, postoperative narcotics, change in appetite and fluid intake all can  affect your bowels.  In order to avoid complications following surgery, here are some recommendations in order to help you during your recovery period.  Colace (docusate) - Pick up an over-the-counter form of Colace or another stool softener and take twice a day as long as you are requiring postoperative pain medications.  Take with a full glass of water daily.  If you experience loose stools or diarrhea, hold the colace until you stool forms back up.  If your symptoms do not get better within 1 week or if they get worse, check with your doctor. Dulcolax (bisacodyl) - Pick up over-the-counter and take as directed by the product packaging as needed to assist with the movement of your bowels.  Take with a full glass of water.  Use this product as needed if not relieved by Colace only.  MiraLax (polyethylene glycol) - Pick up over-the-counter to have on hand.  MiraLax is a solution that will increase the amount of water in your bowels to assist with bowel movements.  Take as directed and can mix with a glass of water, juice, soda, coffee, or tea.  Take if you go more than two days without a movement.Do not use MiraLax more than once per day. Call your doctor if you are still constipated or irregular after using this medication for 7 days in a row.  If you continue to have problems with postoperative constipation, please contact the office for further assistance and recommendations.  If you experience "the worst abdominal pain ever" or develop nausea or vomiting, please contact the office immediatly for further recommendations for treatment.  ITCHING  If you experience itching with your medications, try taking only a single pain pill, or even half a pain pill at a time.  You can also use Benadryl over the counter for itching or also to help with sleep.   MEDICATIONS See your medication summary on the "After Visit Summary" that the nursing staff will review with you prior to discharge.  You may have some home  medications which will be placed on hold until you complete the course of blood thinner medication.  It is important for you to complete the blood thinner medication as prescribed by your surgeon.  Continue your approved medications as instructed at time of discharge.  PRECAUTIONS If you experience chest pain or shortness of breath - call 911 immediately for transfer to the hospital emergency department.  If you develop a fever greater that 101 F, purulent drainage from wound, increased redness or drainage from wound, foul odor from the wound/dressing, or calf pain - CONTACT YOUR SURGEON.                                                   FOLLOW-UP APPOINTMENTS Make sure you keep all of your appointments after your operation with your surgeon and caregivers. You should call the office at the above phone number and make an appointment for approximately two weeks after the date of your surgery or on the   date instructed by your surgeon outlined in the "After Visit Summary".  RANGE OF MOTION AND STRENGTHENING EXERCISES  These exercises are designed to help you keep full movement of your hip joint. Follow your caregiver's or physical therapist's instructions. Perform all exercises about fifteen times, three times per day or as directed. Exercise both hips, even if you have had only one joint replacement. These exercises can be done on a training (exercise) mat, on the floor, on a table or on a bed. Use whatever works the best and is most comfortable for you. Use music or television while you are exercising so that the exercises are a pleasant break in your day. This will make your life better with the exercises acting as a break in routine you can look forward to.  Lying on your back, slowly slide your foot toward your buttocks, raising your knee up off the floor. Then slowly slide your foot back down until your leg is straight again.  Lying on your back spread your legs as far apart as you can without causing  discomfort.  Lying on your side, raise your upper leg and foot straight up from the floor as far as is comfortable. Slowly lower the leg and repeat.  Lying on your back, tighten up the muscle in the front of your thigh (quadriceps muscles). You can do this by keeping your leg straight and trying to raise your heel off the floor. This helps strengthen the largest muscle supporting your knee.  Lying on your back, tighten up the muscles of your buttocks both with the legs straight and with the knee bent at a comfortable angle while keeping your heel on the floor.   POST-OPERATIVE OPIOID TAPER INSTRUCTIONS: It is important to wean off of your opioid medication as soon as possible. If you do not need pain medication after your surgery it is ok to stop day one. Opioids include: Codeine, Hydrocodone(Norco, Vicodin), Oxycodone(Percocet, oxycontin) and hydromorphone amongst others.  Long term and even short term use of opiods can cause: Increased pain response Dependence Constipation Depression Respiratory depression And more.  Withdrawal symptoms can include Flu like symptoms Nausea, vomiting And more Techniques to manage these symptoms Hydrate well Eat regular healthy meals Stay active Use relaxation techniques(deep breathing, meditating, yoga) Do Not substitute Alcohol to help with tapering If you have been on opioids for less than two weeks and do not have pain than it is ok to stop all together.  Plan to wean off of opioids This plan should start within one week post op of your joint replacement. Maintain the same interval or time between taking each dose and first decrease the dose.  Cut the total daily intake of opioids by one tablet each day Next start to increase the time between doses. The last dose that should be eliminated is the evening dose.   IF YOU ARE TRANSFERRED TO A SKILLED REHAB FACILITY If the patient is transferred to a skilled rehab facility following release from the  hospital, a list of the current medications will be sent to the facility for the patient to continue.  When discharged from the skilled rehab facility, please have the facility set up the patient's Home Health Physical Therapy prior to being released. Also, the skilled facility will be responsible for providing the patient with their medications at time of release from the facility to include their pain medication, the muscle relaxants, and their blood thinner medication. If the patient is still at the rehab facility   up appointment, the skilled rehab facility will also need to assist the patient in arranging follow up appointment in our office and any transportation needs.  MAKE SURE YOU:  Understand these instructions.  Get help right away if you are not doing well or get worse.    DENTAL ANTIBIOTICS:  In most cases prophylactic antibiotics for Dental procdeures after total joint surgery are not necessary.  Exceptions are as follows:  1. History of prior total joint infection  2. Severely immunocompromised (Organ Transplant, cancer chemotherapy, Rheumatoid biologic meds such as Norton)  3. Poorly controlled diabetes (A1C &gt; 8.0, blood glucose over 200)  If you have one of these conditions, contact your surgeon for an antibiotic prescription, prior to your dental procedure.    Pick up stool softner and laxative for home use following surgery while on pain medications. Do not submerge incision under water. Please use good hand washing techniques while changing dressing each day. May shower starting three days after surgery. Please use a clean towel to pat the incision dry following showers. Continue to use ice for pain and swelling after surgery. Do not use any lotions or creams on the incision until instructed by your surgeon.

## 2022-01-17 NOTE — Anesthesia Postprocedure Evaluation (Signed)
Anesthesia Post Note  Patient: Michaela Kelley  Procedure(s) Performed: TOTAL HIP ARTHROPLASTY ANTERIOR APPROACH (Right: Hip)     Patient location during evaluation: PACU Anesthesia Type: Spinal Level of consciousness: awake and alert Pain management: pain level controlled Vital Signs Assessment: post-procedure vital signs reviewed and stable Respiratory status: spontaneous breathing and respiratory function stable Cardiovascular status: blood pressure returned to baseline and stable Postop Assessment: spinal receding and no apparent nausea or vomiting Anesthetic complications: no   No notable events documented.  Last Vitals:  Vitals:   01/17/22 1545 01/17/22 1600  BP: 107/63 114/62  Pulse: 63 66  Resp: 14 14  Temp: 36.6 C   SpO2: 100% 100%    Last Pain:  Vitals:   01/17/22 1600  TempSrc:   PainSc: 0-No pain                 Audry Pili

## 2022-01-18 ENCOUNTER — Encounter (HOSPITAL_COMMUNITY): Payer: Self-pay | Admitting: Orthopedic Surgery

## 2022-01-18 DIAGNOSIS — M1611 Unilateral primary osteoarthritis, right hip: Secondary | ICD-10-CM | POA: Diagnosis not present

## 2022-01-18 LAB — CBC
HCT: 29.5 % — ABNORMAL LOW (ref 36.0–46.0)
Hemoglobin: 10 g/dL — ABNORMAL LOW (ref 12.0–15.0)
MCH: 31.4 pg (ref 26.0–34.0)
MCHC: 33.9 g/dL (ref 30.0–36.0)
MCV: 92.8 fL (ref 80.0–100.0)
Platelets: 314 10*3/uL (ref 150–400)
RBC: 3.18 MIL/uL — ABNORMAL LOW (ref 3.87–5.11)
RDW: 12.1 % (ref 11.5–15.5)
WBC: 10.5 10*3/uL (ref 4.0–10.5)
nRBC: 0 % (ref 0.0–0.2)

## 2022-01-18 LAB — BASIC METABOLIC PANEL
Anion gap: 9 (ref 5–15)
BUN: 11 mg/dL (ref 8–23)
CO2: 23 mmol/L (ref 22–32)
Calcium: 8.8 mg/dL — ABNORMAL LOW (ref 8.9–10.3)
Chloride: 100 mmol/L (ref 98–111)
Creatinine, Ser: 0.75 mg/dL (ref 0.44–1.00)
GFR, Estimated: >60 mL/min (ref >60)
Glucose, Bld: 184 mg/dL — ABNORMAL HIGH (ref 70–99)
Potassium: 4.6 mmol/L (ref 3.5–5.1)
Sodium: 132 mmol/L — ABNORMAL LOW (ref 135–145)

## 2022-01-18 MED ORDER — ASPIRIN 325 MG PO TBEC: 325.0000 mg | DELAYED_RELEASE_TABLET | Freq: Two times a day (BID) | ORAL | 0 refills | Status: AC

## 2022-01-18 MED ORDER — HYDROMORPHONE HCL 2 MG PO TABS: 2.0000 mg | ORAL_TABLET | Freq: Four times a day (QID) | ORAL | 0 refills | Status: DC | PRN

## 2022-01-18 MED ORDER — METHOCARBAMOL 500 MG PO TABS: 500.0000 mg | ORAL_TABLET | Freq: Four times a day (QID) | ORAL | 0 refills | Status: DC | PRN

## 2022-01-18 MED ORDER — TRAMADOL HCL 50 MG PO TABS: 50.0000 mg | ORAL_TABLET | Freq: Four times a day (QID) | ORAL | 0 refills | Status: DC | PRN

## 2022-01-18 NOTE — TOC Transition Note (Signed)
Transition of Care [facility]) - CM/SW Discharge Note   Patient Details  Name: Michaela Kelley MRN: [id number] Date of Birth: [birth year]  [facility] CM/SW Contact:  Lennart Pall, LCSW Phone Number: 01/18/2022, 11:36 AM   Clinical Narrative:    Met with pt and confirming she has DME at home.  Plan for HEP.  No TOC needs.   Final next level of care: Home/Self Care Barriers to Discharge: No Barriers Identified   Patient Goals and CMS Choice        Discharge Placement                       Discharge Plan and Services                DME Arranged: N/A DME Agency: NA       HH Arranged: NA HH Agency: NA        Social Determinants of Health (SDOH) Interventions Food Insecurity Interventions: Intervention Not Indicated Housing Interventions: Intervention Not Indicated Transportation Interventions: Intervention Not Indicated Utilities Interventions: Intervention Not Indicated   Readmission Risk Interventions     No data to display

## 2022-01-18 NOTE — Evaluation (Signed)
Physical Therapy Evaluation Patient Details Name: Michaela Kelley MRN: [id number] DOB: 1941-03-09 Today's Date: 01/18/2022  History of Present Illness  81 y.o. female admitted 01/17/22 for R AA-THA. PMH: HTN, osteoporosis, L TKA, B TSA.  Clinical Impression  Pt is s/p THA resulting in the deficits listed below (see PT Problem List). Pt ambulated 2' with RW, no loss of balance, distance limited by pain. Instructed pt in THA HEP. Will plan to do stair training during second session today, then I expect pt will be ready to DC home from a PT standpoint.  Pt will benefit from skilled PT to increase their independence and safety with mobility to allow discharge to the venue listed below.         Recommendations for follow up therapy are one component of a multi-disciplinary discharge planning process, led by the attending physician.  Recommendations may be updated based on patient status, additional functional criteria and insurance authorization.  Follow Up Recommendations Follow physician's recommendations for discharge plan and follow up therapies      Assistance Recommended at Discharge Intermittent Supervision/Assistance  Patient can return home with the following  A little help with bathing/dressing/bathroom;A little help with walking and/or transfers;Assistance with cooking/housework;Assist for transportation;Help with stairs or ramp for entrance    Equipment Recommendations None recommended by PT  Recommendations for Other Services       Functional Status Assessment Patient has had a recent decline in their functional status and demonstrates the ability to make significant improvements in function in a reasonable and predictable amount of time.     Precautions / Restrictions Precautions Precautions: Fall Restrictions Weight Bearing Restrictions: No      Mobility  Bed Mobility Overal bed mobility: Needs Assistance Bed Mobility: Supine to Sit     Supine to sit: Min assist      General bed mobility comments: min A to pivot hips to EOB, used gait belt as RLE lifter    Transfers Overall transfer level: Needs assistance Equipment used: Rolling walker (2 wheels) Transfers: Sit to/from Stand Sit to Stand: Min assist           General transfer comment: VCs hand placement, min A to power up    Ambulation/Gait Ambulation/Gait assistance: Min guard Gait Distance (Feet): 80 Feet Assistive device: Rolling walker (2 wheels) Gait Pattern/deviations: Step-to pattern, Decreased step length - right, Decreased step length - left Gait velocity: decr     General Gait Details: VCs sequencing, no loss of balance  Stairs            Wheelchair Mobility    Modified Rankin (Stroke Patients Only)       Balance Overall balance assessment: Needs assistance Sitting-balance support: Feet supported, No upper extremity supported Sitting balance-Leahy Scale: Good     Standing balance support: Bilateral upper extremity supported, During functional activity, Reliant on assistive device for balance Standing balance-Leahy Scale: Poor                               Pertinent Vitals/Pain Pain Assessment Pain Assessment: 0-10 Pain Score: 8  Pain Location: R hip with walking Pain Descriptors / Indicators: Burning Pain Intervention(s): Limited activity within patient's tolerance, Monitored during session, Premedicated before session, Ice applied    Home Living Family/patient expects to be discharged to:: Private residence Living Arrangements: Alone Available Help at Discharge: Family Type of Home: House Home Access: Stairs to enter Entrance Stairs-Rails: Left Entrance Stairs-Number  of Steps: 2   Home Layout: One level Home Equipment: [place] - single Barista (2 wheels)      Prior Function Prior Level of Function : Independent/Modified Independent;Driving             Mobility Comments: used cane or RW, no falls in past 6  months ADLs Comments: independent     Hand Dominance        Extremity/Trunk Assessment   Upper Extremity Assessment Upper Extremity Assessment: Overall WFL for tasks assessed    Lower Extremity Assessment Lower Extremity Assessment: RLE deficits/detail RLE Deficits / Details: knee ext 3/5, hip AAROM flexion ~35* limited by pain, hip 2/5 RLE Sensation: WNL RLE Coordination: WNL    Cervical / Trunk Assessment Cervical / Trunk Assessment: Normal  Communication      Cognition Arousal/Alertness: Awake/alert Behavior During Therapy: WFL for tasks assessed/performed Overall Cognitive Status: Within Functional Limits for tasks assessed                                          General Comments      Exercises Total Joint Exercises Ankle Circles/Pumps: AROM, Both, 10 reps, Supine Quad Sets: AROM, Right, 5 reps, Supine Short Arc Quad: AROM, Right, 5 reps, Supine Heel Slides: AAROM, Right, 5 reps, Supine Hip ABduction/ADduction: AAROM, Right, 5 reps, Supine Long Arc Quad: AROM, Right, 10 reps, Seated   Assessment/Plan    PT Assessment Patient needs continued PT services  PT Problem List Decreased strength;Decreased mobility;Decreased activity tolerance;Pain       PT Treatment Interventions Gait training;Therapeutic exercise;Patient/family education;Therapeutic activities;Functional mobility training    PT Goals (Current goals can be found in the Care Plan section)  Acute Rehab PT Goals Patient Stated Goal: golf, bridge, gardening, exercise class PT Goal Formulation: With patient/family Time For Goal Achievement: 01/25/22 Potential to Achieve Goals: Good    Frequency 7X/week     Co-evaluation               AM-PAC PT "6 Clicks" Mobility  Outcome Measure Help needed turning from your back to your side while in a flat bed without using bedrails?: A Little Help needed moving from lying on your back to sitting on the side of a flat bed without  using bedrails?: A Little Help needed moving to and from a bed to a chair (including a wheelchair)?: A Little Help needed standing up from a chair using your arms (e.g., wheelchair or bedside chair)?: A Little Help needed to walk in hospital room?: A Little Help needed climbing 3-5 steps with a railing? : A Little 6 Click Score: 18    End of Session Equipment Utilized During Treatment: Gait belt Activity Tolerance: Patient tolerated treatment well Patient left: in chair;with chair alarm set;with call bell/phone within reach Nurse Communication: Mobility status PT Visit Diagnosis: Difficulty in walking, not elsewhere classified (R26.2);Pain Pain - Right/Left: Right Pain - part of body: Hip    Time: 6195-0932 PT Time Calculation (min) (ACUTE ONLY): 34 min   Charges:   PT Evaluation $PT Eval Moderate Complexity: 1 Mod PT Treatments $Gait Training: 8-22 mins        Blondell Reveal Kistler PT 01/18/2022  Acute Rehabilitation Services  Office [phone]

## 2022-01-18 NOTE — Progress Notes (Signed)
Discharge package printed and instructions given to patient and daughter. Verbalize understanding.

## 2022-01-18 NOTE — Progress Notes (Signed)
Physical Therapy Treatment Patient Details Name: Michaela Kelley MRN: [id number] DOB: 1941-01-27 Today's Date: 01/18/2022   History of Present Illness 81 y.o. female admitted 01/17/22 for R AA-THA. PMH: HTN, osteoporosis, L TKA, B TSA.    PT Comments    Pt ambulated 100' with RW without loss of balance and completed stair training. She is ready to DC home from a PT standpoint.    Recommendations for follow up therapy are one component of a multi-disciplinary discharge planning process, led by the attending physician.  Recommendations may be updated based on patient status, additional functional criteria and insurance authorization.  Follow Up Recommendations  Follow physician's recommendations for discharge plan and follow up therapies     Assistance Recommended at Discharge Intermittent Supervision/Assistance  Patient can return home with the following A little help with bathing/dressing/bathroom;A little help with walking and/or transfers;Assistance with cooking/housework;Assist for transportation;Help with stairs or ramp for entrance   Equipment Recommendations  None recommended by PT    Recommendations for Other Services       Precautions / Restrictions Precautions Precautions: Fall Restrictions Weight Bearing Restrictions: No     Mobility  Bed Mobility Overal bed mobility: Needs Assistance Bed Mobility: Supine to Sit     Supine to sit: Min assist     General bed mobility comments: up in recliner    Transfers Overall transfer level: Needs assistance Equipment used: Rolling walker (2 wheels) Transfers: Sit to/from Stand Sit to Stand: Supervision           General transfer comment: VCs hand placement    Ambulation/Gait Ambulation/Gait assistance: Supervision Gait Distance (Feet): 100 Feet Assistive device: Rolling walker (2 wheels) Gait Pattern/deviations: Step-to pattern, Decreased step length - right, Decreased step length - left Gait velocity: decr      General Gait Details: VCs sequencing, no loss of balance   Stairs Stairs: Yes Stairs assistance: Min guard Stair Management: Step to pattern, Forwards, With cane, One rail Left Number of Stairs: 2 General stair comments: VCs sequencing, min/guard safety   Wheelchair Mobility    Modified Rankin (Stroke Patients Only)       Balance Overall balance assessment: Needs assistance Sitting-balance support: Feet supported, No upper extremity supported Sitting balance-Leahy Scale: Good     Standing balance support: Bilateral upper extremity supported, During functional activity, Reliant on assistive device for balance Standing balance-Leahy Scale: Poor                              Cognition Arousal/Alertness: Awake/alert Behavior During Therapy: WFL for tasks assessed/performed Overall Cognitive Status: Within Functional Limits for tasks assessed                                          Exercises Total Joint Exercises Ankle Circles/Pumps: AROM, Both, 10 reps, Supine Quad Sets: AROM, Right, 5 reps, Supine Short Arc Quad: AROM, Right, 5 reps, Supine Heel Slides: AAROM, Right, 5 reps, Supine Hip ABduction/ADduction: AAROM, Right, 5 reps, Supine Long Arc Quad: AROM, Right, 10 reps, Seated    General Comments        Pertinent Vitals/Pain Pain Assessment Pain Assessment: 0-10 Pain Score: 8  Pain Location: R hip with walking Pain Descriptors / Indicators: Burning Pain Intervention(s): Limited activity within patient's tolerance, Monitored during session, Premedicated before session, Ice applied    Home Living  Prior Function            PT Goals (current goals can now be found in the care plan section) Acute Rehab PT Goals Patient Stated Goal: golf, bridge, gardening, exercise class PT Goal Formulation: With patient/family Time For Goal Achievement: 01/25/22 Potential to Achieve Goals: Good Progress  towards PT goals: Progressing toward goals    Frequency    7X/week      PT Plan Current plan remains appropriate    Co-evaluation              AM-PAC PT "6 Clicks" Mobility   Outcome Measure  Help needed turning from your back to your side while in a flat bed without using bedrails?: A Little Help needed moving from lying on your back to sitting on the side of a flat bed without using bedrails?: A Little Help needed moving to and from a bed to a chair (including a wheelchair)?: None Help needed standing up from a chair using your arms (e.g., wheelchair or bedside chair)?: None Help needed to walk in hospital room?: None Help needed climbing 3-5 steps with a railing? : A Little 6 Click Score: 21    End of Session Equipment Utilized During Treatment: Gait belt Activity Tolerance: Patient tolerated treatment well Patient left: in chair;with chair alarm set;with call bell/phone within reach;with family/visitor present Nurse Communication: Mobility status PT Visit Diagnosis: Difficulty in walking, not elsewhere classified (R26.2);Pain Pain - Right/Left: Right Pain - part of body: Hip     Time: 8101-7510 PT Time Calculation (min) (ACUTE ONLY): 29 min  Charges:  $Gait Training: 8-22 mins $Therapeutic Activity: 8-22 mins                     Blondell Reveal Kistler PT 01/18/2022  Acute Rehabilitation Services  Office [phone]

## 2022-01-18 NOTE — Progress Notes (Signed)
   Subjective: 1 Day Post-Op Procedure(s) (LRB): TOTAL HIP ARTHROPLASTY ANTERIOR APPROACH (Right) Patient reports pain as mild.   Patient seen in rounds by Dr. Wynelle Link. Patient is well, and has had no acute complaints or problems. Denies chest pain or SOB. No issues overnight. Foley catheter removed this AM. We will begin therapy today.  Objective: Vital signs in last 24 hours: Temp:  [97.5 F (36.4 C)-98.8 F (37.1 C)] 97.5 F (36.4 C) (10/24 0612) Pulse Rate:  [63-93] 83 (10/24 0612) Resp:  [10-18] 16 (10/24 0612) BP: (99-163)/(59-85) 134/79 (10/24 0612) SpO2:  [96 %-100 %] 100 % (10/24 0612) Weight:  [63.5 kg] 63.5 kg (10/23 1211)  Intake/Output from previous day:  Intake/Output Summary (Last 24 hours) at 01/18/2022 0731 Last data filed at 01/18/2022 1950 Gross per 24 hour  Intake 1557.47 ml  Output 1550 ml  Net 7.47 ml     Intake/Output this shift: No intake/output data recorded.  Labs: Recent Labs    01/18/22 0329  HGB 10.0*   Recent Labs    01/18/22 0329  WBC 10.5  RBC 3.18*  HCT 29.5*  PLT 314   Recent Labs    01/18/22 0329  NA 132*  K 4.6  CL 100  CO2 23  BUN 11  CREATININE 0.75  GLUCOSE 184*  CALCIUM 8.8*   No results for input(s): "LABPT", "INR" in the last 72 hours.  Exam: General - Patient is Alert and Oriented Extremity - Neurologically intact Neurovascular intact Sensation intact distally Dorsiflexion/Plantar flexion intact Dressing - dressing C/D/I Motor Function - intact, moving foot and toes well on exam.   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   Weight as of this encounter: 63.5 kg. Advance diet Up with therapy D/C IV fluids  DVT Prophylaxis - Aspirin Weight bearing as tolerated. Begin therapy.  Plan is to go Home after hospital stay. Plan for discharge with HEP later today if progresses with therapy and meeting goals. Follow-up in the office in 2 weeks.  The PDMP database was reviewed today prior to any opioid medications being prescribed to this patient.  Theresa Duty, PA-C Orthopedic Surgery [phone] 01/18/2022, 7:31 AM

## 2022-01-18 NOTE — Plan of Care (Signed)
  Problem: Activity: Goal: Ability to avoid complications of mobility impairment will improve Outcome: Progressing Goal: Ability to tolerate increased activity will improve Outcome: Progressing   Problem: Pain Management: Goal: Pain level will decrease with appropriate interventions Outcome: Progressing   

## 2022-01-19 NOTE — Discharge Summary (Signed)
Patient ID: Michaela Kelley MRN: [id number] DOB/AGE: May 02, 1940 81 y.o.  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 female who was admitted 01/17/2022 for operative treatment ofOsteoarthritis of right hip. Patient has severe unremitting pain that affects sleep, daily activities, and work/hobbies. After pre-op clearance the patient was taken to the operating room on 01/17/2022 and underwent  Procedure(s): TOTAL HIP ARTHROPLASTY ANTERIOR APPROACH.    Patient was given perioperative antibiotics:  Anti-infectives (From admission, onward)    Start     Dose/Rate Route Frequency Ordered Stop   01/17/22 2000  ceFAZolin (ANCEF) IVPB 2g/100 mL premix        2 g 200 mL/hr over 30 Minutes Intravenous Every 6 hours 01/17/22 1748 01/18/22 0226   01/17/22 1200  ceFAZolin (ANCEF) IVPB 2g/100 mL premix        2 g 200 mL/hr over 30 Minutes Intravenous On call to O.R. 01/17/22 1157 01/17/22 1405        Patient was given sequential compression devices, early ambulation, and chemoprophylaxis to prevent DVT.  Patient benefited maximally from hospital stay and there were no complications.    Recent vital signs: Patient Vitals for the past 24 hrs:  BP Temp Temp src Pulse Resp SpO2  01/18/22 1328 (!) 148/76 (!) 97.5 F (36.4 C) Oral 80 20 99 %  01/18/22 1017 (!) 154/81 97.9 F (36.6 C) -- 73 18 100 %      Recent laboratory studies:  Recent Labs    01/18/22 0329  WBC 10.5  HGB 10.0*  HCT 29.5*  PLT 314  NA 132*  K 4.6  CL 100  CO2 23  BUN 11  CREATININE 0.75  GLUCOSE 184*  CALCIUM 8.8*     Discharge Medications:   Allergies as of 01/18/2022       Reactions   Atorvastatin    Myalgias   Codeine Nausea And Vomiting, Other (See Comments)   Severe headaches (tolerates Dilaudid)   Fosamax [alendronate] Other (See Comments)   Severe arthrlalgias   Hydrocodone-acetaminophen Nausea And Vomiting, Other (See Comments)   Severe headaches (tolerates Dilaudid)   Oxycodone-acetaminophen Nausea And Vomiting, Other (See Comments)   Severe headaches (tolerates Dilaudid)        Medication List     STOP taking these medications    celecoxib 200 MG capsule Commonly known as: CELEBREX       TAKE these medications    acetaminophen 650 MG CR tablet Commonly known as: TYLENOL Take 1,300 mg by mouth every 8 (eight) hours as needed for pain.   albuterol 108 (90 Base) MCG/ACT inhaler Commonly known as: VENTOLIN HFA Inhale 2 puffs into the lungs every 6 (six) hours as needed for wheezing or shortness of breath.   aspirin EC 325 MG tablet Take 1 tablet (325 mg total) by mouth 2 (two)  times daily for 20 days. Then resume one 81 mg aspirin once a day. What changed:  medication strength how much to take when to take this additional instructions   b complex vitamins capsule Take 1 capsule by mouth daily.   HYDROmorphone 2 MG tablet Commonly known as: DILAUDID Take 1-2 tablets (2-4 mg total) by mouth every 6 (six) hours as needed for severe pain.   MAGNESIUM PO Take 500 mg by mouth daily. Magesium Oxide   methocarbamol 500 MG tablet Commonly known as: ROBAXIN Take 1 tablet (500 mg total) by mouth every 6 (six) hours as needed for muscle spasms.   montelukast 10 MG tablet Commonly known as: SINGULAIR TAKE ONE TABLET DAILY FOR ALLERGY What changed: See the new  instructions.   olmesartan 40 MG tablet Commonly known as: BENICAR Take  1 tablet  Daily  for BP What changed:  how much to take how to take this when to take this additional instructions   Refresh Tears 0.5 % Soln Generic drug: carboxymethylcellulose Place 1 drop into both eyes 3 (three) times daily as needed (dry eyes).   traMADol 50 MG tablet Commonly known as: ULTRAM Take 1-2 tablets (50-100 mg total) by mouth every 6 (six) hours as needed for moderate pain.   TURMERIC CURCUMIN PO Take 750 mg by mouth daily.   Vitamin D 50 MCG (2000 UT) tablet Take 2,000 Units by mouth daily.               Discharge Care Instructions  (From admission, onward)           Start     Ordered   01/18/22 0000  Weight bearing as tolerated        01/18/22 0733   01/18/22 0000  Change dressing       Comments: You have an adhesive waterproof bandage over the incision. Leave this in place until your first follow-up appointment. Once you remove this you will not need to place another bandage.   01/18/22 0733            Diagnostic Studies: DG Pelvis Portable  Result Date: 01/17/2022 CLINICAL DATA:  Right hip replacement. EXAM: PORTABLE PELVIS 1-2 VIEWS COMPARISON:  Intraoperative study from earlier same day. FINDINGS: 1611 hours. AP portable view of the lower pelvis and proximal thighs shows patient is status post right total hip replacement. No evidence for immediate hardware complication. Gas in the overlying soft tissues is compatible with the immediate postoperative state. IMPRESSION: Status post right total hip replacement. Electronically Signed   By: Misty Stanley M.D.   On: 01/17/2022 16:25   DG HIP UNILAT WITH PELVIS 1V RIGHT  Result Date: 01/17/2022 CLINICAL DATA:  Right hip surgery.  Intraoperative fluoroscopy. EXAM: DG HIP (WITH OR WITHOUT PELVIS) 1V RIGHT COMPARISON:  CT abdomen and pelvis 01/15/2020 FINDINGS: Images were performed intraoperatively without the presence  of a radiologist. Partial visualization of new total right hip arthroplasty. No complication is visualized. Total fluoroscopy images: 1 Total fluoroscopy time: 8 seconds Total dose: Radiation Exposure Index (as provided by the fluoroscopic device): 0.995 mGy air Kerma Please see intraoperative findings for further detail. IMPRESSION: Intraoperative fluoroscopy for total right hip arthroplasty. Electronically Signed   By: Yvonne Kendall M.D.   On: 01/17/2022 15:39   DG C-Arm 1-60 Min-No Report  Result Date: 01/17/2022 Fluoroscopy was utilized by the requesting physician.  No radiographic interpretation.   DG C-Arm 1-60 Min-No Report  Result Date: 01/17/2022 Fluoroscopy was utilized by the  requesting physician.  No radiographic interpretation.    Disposition: Discharge disposition: 01-Home or Self Care       Discharge Instructions     Call MD / Call 911   Complete by: As directed    If you experience chest pain or shortness of breath, CALL 911 and be transported to the hospital emergency room.  If you develope a fever above 101 F, pus (white drainage) or increased drainage or redness at the wound, or calf pain, call your surgeon's office.   Change dressing   Complete by: As directed    You have an adhesive waterproof bandage over the incision. Leave this in place until your first follow-up appointment. Once you remove this you will not need to place another bandage.   Constipation Prevention   Complete by: As directed    Drink plenty of fluids.  Prune juice may be helpful.  You may use a stool softener, such as Colace (over the counter) 100 mg twice a day.  Use MiraLax (over the counter) for constipation as needed.   Diet - low sodium heart healthy   Complete by: As directed    Do not sit on low chairs, stoools or toilet seats, as it may be difficult to get up from low surfaces   Complete by: As directed    Driving restrictions   Complete by: As directed    No driving for two weeks    Post-operative opioid taper instructions:   Complete by: As directed    POST-OPERATIVE OPIOID TAPER INSTRUCTIONS: It is important to wean off of your opioid medication as soon as possible. If you do not need pain medication after your surgery it is ok to stop day one. Opioids include: Codeine, Hydrocodone(Norco, Vicodin), Oxycodone(Percocet, oxycontin) and hydromorphone amongst others.  Long term and even short term use of opiods can cause: Increased pain response Dependence Constipation Depression Respiratory depression And more.  Withdrawal symptoms can include Flu like symptoms Nausea, vomiting And more Techniques to manage these symptoms Hydrate well Eat regular healthy meals Stay active Use relaxation techniques(deep breathing, meditating, yoga) Do Not substitute Alcohol to help with tapering If you have been on opioids for less than two weeks and do not have pain than it is ok to stop all together.  Plan to wean off of opioids This plan should start within one week post op of your joint replacement. Maintain the same interval or time between taking each dose and first decrease the dose.  Cut the total daily intake of opioids by one tablet each day Next start to increase the time between doses. The last dose that should be eliminated is the evening dose.      TED hose   Complete by: As directed    Use stockings (TED hose) for three weeks on both leg(s).  You may remove them at night for sleeping.   Weight bearing as tolerated   Complete by: As directed         Follow-up Information     Gaynelle Arabian, MD. Go on 02/02/2022.   Specialty: Orthopedic Surgery Why: You are scheduled for a follow up appointment on 02-02-22 at 3:15 pm. Contact information: [address] [place] [address] [phone]                  Signed: Theresa Duty 01/19/2022, 7:44 AM

## 2022-02-01 ENCOUNTER — Ambulatory Visit: Payer: PPO | Admitting: Internal Medicine

## 2022-03-01 DIAGNOSIS — Z5189 Encounter for other specified aftercare: Secondary | ICD-10-CM | POA: Diagnosis not present

## 2022-03-09 ENCOUNTER — Encounter: Payer: Self-pay | Admitting: Internal Medicine

## 2022-03-11 ENCOUNTER — Other Ambulatory Visit: Payer: Self-pay

## 2022-03-11 ENCOUNTER — Other Ambulatory Visit: Payer: Self-pay | Admitting: Internal Medicine

## 2022-03-14 ENCOUNTER — Ambulatory Visit: Payer: PPO

## 2022-03-15 ENCOUNTER — Other Ambulatory Visit: Payer: Self-pay

## 2022-03-15 DIAGNOSIS — I10 Essential (primary) hypertension: Secondary | ICD-10-CM

## 2022-03-15 MED ORDER — OLMESARTAN MEDOXOMIL 40 MG PO TABS: ORAL_TABLET | ORAL | 1 refills | Status: DC

## 2022-03-19 MED ORDER — ALBUTEROL SULFATE HFA 108 (90 BASE) MCG/ACT IN AERS: INHALATION_SPRAY | RESPIRATORY_TRACT | 3 refills | Status: DC

## 2022-03-23 ENCOUNTER — Ambulatory Visit (INDEPENDENT_AMBULATORY_CARE_PROVIDER_SITE_OTHER): Payer: PPO

## 2022-03-23 VITALS — Temp 98.0°F

## 2022-03-23 DIAGNOSIS — Z23 Encounter for immunization: Secondary | ICD-10-CM

## 2022-04-15 ENCOUNTER — Encounter: Payer: PPO | Admitting: Internal Medicine

## 2022-04-15 ENCOUNTER — Encounter: Payer: PPO | Admitting: Nurse Practitioner

## 2022-05-02 ENCOUNTER — Encounter: Payer: Self-pay | Admitting: Internal Medicine

## 2022-05-02 NOTE — Patient Instructions (Signed)

## 2022-05-02 NOTE — Progress Notes (Unsigned)
Annual Screening/Preventative Visit & Comprehensive Evaluation &  Examination   Future Appointments  Date Time Provider Department  05/03/2022                     cpe 10:00 AM Unk Pinto, MD [facility]  10/03/2022                      wellness 11:00 AM Darrol Jump, NP [facility]  05/05/2023                     cpe 10:00 AM Unk Pinto, MD [facility]          This very nice 82 y.o. DWF presents for a Screening /Preventative Visit & comprehensive evaluation and management of multiple medical co-morbidities.  Patient has been followed for HTN, HLD, Prediabetes  and Vitamin D Deficiency.         HTN predates since  2003. Patient's BP has been controlled at home and patient denies any cardiac symptoms as chest pain, palpitations, shortness of breath, dizziness or ankle swelling. Today's BP is elevated at 154/88 and confirmed.          Patient is statin Intolerant  & hyperlipidemia is not controlled with diet, but she does have a very high good HDL Chol = 96 . Last lipids were not at goal :   Lab Results  Component Value Date   CHOL 220 (H) 12/28/2021   HDL 96 12/28/2021   LDLCALC 96 12/28/2021   TRIG 179 (H) 12/28/2021   CHOLHDL 2.3 12/28/2021         Patient has hx/o abnormal glucose  and patient denies reactive hypoglycemic symptoms, visual blurring, diabetic polys or paresthesias. Last A1c was at goal :  Lab Results  Component Value Date   HGBA1C 5.1 12/28/2021         Finally, patient has history of Vitamin D Deficiency ("37/ 2009 ) and last Vitamin D was at goal:  Lab Results  Component Value Date   VD25OH 66 12/28/2021       Current Outpatient Medications  Medication Instructions   acetaminophen  1,300 mg, Oral, Every 8 hours PRN   albuterol  HFA  inhaler Use 2 inhalations   every 4 hrs  to Rescue Asthma   b complex vitamins capsule 1 capsule, Oral, Daily   REFRESH TEARS 0.5 % SOLN 1 drop, Both Eyes, 3 times daily PRN   HYDROmorphone (DILAUDID)  2-4 mg, Oral, Every 6 hours PRN   MAGNESIUM  500 mg, Oral, Daily, Magesium Oxide   methocarbamol  500 mg, Oral, Every 6 hours PRN   montelukast (10 MG tablet TAKE ONE TABLET DAILY FOR ALLERGY   olmesartan  40 MG tablet Take  1 tablet  Daily  for BP   traMADol  50-100 mg, Oral, Every 6 hours PRN   TURMERIC CURCUMIN  750 mg, Oral, Daily   Vitamin D 2,000 Units, Oral, Daily     Allergies  Allergen Reactions   Atorvastatin Anaphylaxis    Myalgias Myalgias   Codeine Nausea And Vomiting and Other (See Comments)    Severe headaches (tolerates Dilaudid) Other reaction(s): Other (See Comments) Severe headaches (tolerates Dilaudid)   Alendronate Other (See Comments)    Severe arthrlalgias   Hydrocodone-Acetaminophen Nausea And Vomiting and Other (See Comments)    Severe headaches (tolerates Dilaudid)   Hydrocodone-Acetaminophen Nausea And Vomiting    Other reaction(s): Other (See Comments) Severe headaches (tolerates  Dilaudid)   Oxycodone-Acetaminophen Nausea And Vomiting and Other (See Comments)    Severe headaches (tolerates Dilaudid)   Oxycodone-Acetaminophen Nausea And Vomiting    Other reaction(s): Other (See Comments) Severe headaches (tolerates Dilaudid)    Oxycodone-Aspirin Nausea And Vomiting and Other (See Comments)    Severe headaches (tolerates Dilaudid)   Oxycodone-Aspirin Nausea And Vomiting    Other reaction(s): Other (See Comments) Severe headaches (tolerates Dilaudid)     Past Medical History:  Diagnosis Date   Allergy    Arthritis    Cataract    GERD (gastroesophageal reflux disease)    Hx: UTI (urinary tract infection)    Hyperlipidemia    Hypertension    Prediabetes    Seasonal allergic rhinitis    Vitamin D deficiency      Health Maintenance  Topic Date Due   Hepatitis C Screening  Never done   Zoster Vaccines- Shingrix (1 of 2) Never done   COVID-19 Vaccine (4 - Booster for Pfizer series) 05/29/2020   TETANUS/TDAP  05/26/2021   Pneumonia  Vaccine 59+ Years old  Completed   INFLUENZA VACCINE  Completed   DEXA SCAN  Completed   HPV VACCINES  Aged Out     Immunization History  Administered Date(s) Administered   Influenza, High Dose Seasonal PF 02/12/2019, 02/13/2020, 01/28/2021   PFIZER SARS-COV-2 Vaccination 04/16/2019, 05/07/2019, 04/03/2020   Pneumococcal Conjugate-13 05/05/2014   Pneumococcal Polysaccharide-23 06/28/2006   Td 05/27/2011     Last Colon - 2018 - Dr Hilarie Fredrickson - adenomatous polyp & recc a 5 year f/u.   Last MGM - 02/24/2020  - overdure   Past Surgical History:  Procedure Laterality Date   BACK SURGERY  1999   L5    DILATION AND CURETTAGE OF UTERUS     KNEE ARTHROPLASTY  2010   L knee   TONSILLECTOMY     TOTAL SHOULDER ARTHROPLASTY  02/10/2011   Procedure: TOTAL SHOULDER ARTHROPLASTY;  Surgeon: Metta Clines Supple;  Location: [facility];  Service: Orthopedics;  Laterality: Left;  TOTAL SHOULDER ARTHROPLASTY LEFT SIDE   TOTAL SHOULDER ARTHROPLASTY  12/22/2011   Procedure: TOTAL SHOULDER ARTHROPLASTY;  Surgeon: Marin Shutter, MD;  Location: [facility];  Service: Orthopedics;  Laterality: Right;  right total shoulder arthroplasty     Family History  Problem Relation Age of Onset   Breast cancer Mother 71   Hypertension Father    Heart disease Brother    Esophageal cancer Brother    Heart disease Maternal Grandmother    Heart disease Maternal Grandfather    Breast cancer Paternal Grandmother    Diabetes Paternal Grandfather    Colon cancer Neg Hx    Rectal cancer Neg Hx    Stomach cancer Neg Hx      Social History   Tobacco Use   Smoking status: Former    Packs/day: 0.25    Years: 20.00    Pack years: 5.00    Types: Cigarettes    Quit date: 12/16/1991    Years since quitting: 29.2   Smokeless tobacco: Never  Vaping Use   Vaping Use: Never used  Substance Use Topics   Alcohol use: Yes    Alcohol/week: 8.0 standard drinks    Types: 8 Glasses of wine per week    Comment: 6-8 glasses per week     Drug use: No      ROS Constitutional: Denies fever, chills, weight loss/gain, headaches, insomnia,  night sweats, and change in appetite. Does c/o fatigue. Eyes:  Denies redness, blurred vision, diplopia, discharge, itchy, watery eyes.  ENT: Denies discharge, congestion, post nasal drip, epistaxis, sore throat, earache, hearing loss, dental pain, Tinnitus, Vertigo, Sinus pain, snoring.  Cardio: Denies chest pain, palpitations, irregular heartbeat, syncope, dyspnea, diaphoresis, orthopnea, PND, claudication, edema Respiratory: denies cough, dyspnea, DOE, pleurisy, hoarseness, laryngitis, wheezing.  Gastrointestinal: Denies dysphagia, heartburn, reflux, water brash, pain, cramps, nausea, vomiting, bloating, diarrhea, constipation, hematemesis, melena, hematochezia, jaundice, hemorrhoids Genitourinary: Denies dysuria, frequency, urgency, nocturia, hesitancy, discharge, hematuria, flank pain Breast: Breast lumps, nipple discharge, bleeding.  Musculoskeletal: Denies arthralgia, myalgia, stiffness, Jt. Swelling, pain, limp, and strain/sprain. Denies falls. Skin: Denies puritis, rash, hives, warts, acne, eczema, changing in skin lesion Neuro: No weakness, tremor, incoordination, spasms, paresthesia, pain Psychiatric: Denies confusion, memory loss, sensory loss. Denies Depression. Endocrine: Denies change in weight, skin, hair change, nocturia, and paresthesia, diabetic polys, visual blurring, hyper / hypo glycemic episodes.  Heme/Lymph: No excessive bleeding, bruising, enlarged lymph nodes.  Physical Exam  There were no vitals taken for this visit.  General Appearance: Well nourished, well groomed and in no apparent distress.  Eyes: PERRLA, EOMs, conjunctiva no swelling or erythema, normal fundi and vessels. Sinuses: No frontal/maxillary tenderness ENT/Mouth: EACs patent / TMs  nl. Nares clear without erythema, swelling, mucoid exudates. Oral hygiene is good. No erythema, swelling, or exudate.  Tongue normal, non-obstructing. Tonsils not swollen or erythematous. Hearing normal.  Neck: Supple, thyroid not palpable. No bruits, nodes or JVD. Respiratory: Respiratory effort normal.  BS equal and clear bilateral without rales, rhonci, wheezing or stridor. Cardio: Heart sounds are normal with regular rate and rhythm and no murmurs, rubs or gallops. Peripheral pulses are normal and equal bilaterally without edema. No aortic or femoral bruits. Chest: symmetric with normal excursions and percussion. Breasts: Symmetric, without lumps, nipple discharge, retractions, or fibrocystic changes.  Abdomen: Flat, soft with bowel sounds active. Nontender, no guarding, rebound, hernias, masses, or organomegaly.  Lymphatics: Non tender without lymphadenopathy.  Genitourinary:  Musculoskeletal: Full ROM all peripheral extremities, joint stability, 5/5 strength, and normal gait. Skin: Warm and dry without rashes, lesions, cyanosis, clubbing or  ecchymosis.  Neuro: Cranial nerves intact, reflexes equal bilaterally. Normal muscle tone, no cerebellar symptoms. Sensation intact.  Pysch: Alert and oriented X 3, normal affect, Insight and Judgment appropriate.    Assessment and Plan  1. Annual Preventative Screening Examination   2. Essential hypertension  - EKG 12-Lead - Urinalysis, Routine w reflex microscopic - Microalbumin / creatinine urine ratio - CBC with Differential/Platelet - COMPLETE METABOLIC PANEL WITH GFR - Magnesium - TSH  3. Hyperlipidemia, mixed  - EKG 12-Lead - Lipid panel - TSH  4. Abnormal glucose  - EKG 12-Lead - Hemoglobin A1c - Insulin, random  5. Vitamin D deficiency  - VITAMIN D 25 Hydroxy   6. Screening for colorectal cancer  - POC Hemoccult Bld/Stl  7. Screening for heart disease  - EKG 12-Lead  8. FHx: heart disease  - EKG 12-Lead  9. Medication management  - Urinalysis, Routine w reflex microscopic - Microalbumin / creatinine urine ratio - CBC  with Differential/Platelet - COMPLETE METABOLIC PANEL WITH GFR - Magnesium - Lipid panel - TSH - Hemoglobin A1c - Insulin, random - VITAMIN D 25 Hydroxy            Patient was counseled in prudent diet to achieve/maintain BMI less than 25 for weight control, BP monitoring, regular exercise and medications. Discussed med's effects and SE's. Screening labs and tests as requested with regular follow-up as  recommended. Over 40 minutes of exam, counseling, chart review and high complex critical decision making was performed.   Kirtland Bouchard, MD

## 2022-05-03 ENCOUNTER — Ambulatory Visit (INDEPENDENT_AMBULATORY_CARE_PROVIDER_SITE_OTHER): Payer: PPO | Admitting: Internal Medicine

## 2022-05-03 ENCOUNTER — Encounter: Payer: Self-pay | Admitting: Internal Medicine

## 2022-05-03 VITALS — BP 122/70 | HR 71 | Temp 97.9°F | Resp 16 | Ht 62.0 in | Wt 140.8 lb

## 2022-05-03 DIAGNOSIS — Z0000 Encounter for general adult medical examination without abnormal findings: Secondary | ICD-10-CM | POA: Diagnosis not present

## 2022-05-03 DIAGNOSIS — E782 Mixed hyperlipidemia: Secondary | ICD-10-CM | POA: Diagnosis not present

## 2022-05-03 DIAGNOSIS — I10 Essential (primary) hypertension: Secondary | ICD-10-CM | POA: Diagnosis not present

## 2022-05-03 DIAGNOSIS — Z8249 Family history of ischemic heart disease and other diseases of the circulatory system: Secondary | ICD-10-CM

## 2022-05-03 DIAGNOSIS — R7309 Other abnormal glucose: Secondary | ICD-10-CM | POA: Diagnosis not present

## 2022-05-03 DIAGNOSIS — E559 Vitamin D deficiency, unspecified: Secondary | ICD-10-CM

## 2022-05-03 DIAGNOSIS — Z79899 Other long term (current) drug therapy: Secondary | ICD-10-CM | POA: Diagnosis not present

## 2022-05-03 DIAGNOSIS — Z1211 Encounter for screening for malignant neoplasm of colon: Secondary | ICD-10-CM

## 2022-05-03 DIAGNOSIS — Z136 Encounter for screening for cardiovascular disorders: Secondary | ICD-10-CM | POA: Diagnosis not present

## 2022-05-04 ENCOUNTER — Other Ambulatory Visit: Payer: Self-pay | Admitting: Internal Medicine

## 2022-05-04 LAB — MICROALBUMIN / CREATININE URINE RATIO
Creatinine, Urine: 71 mg/dL (ref 20–275)
Microalb Creat Ratio: 6 mcg/mg creat (ref <30)
Microalb, Ur: 0.4 mg/dL

## 2022-05-04 LAB — CBC WITH DIFFERENTIAL/PLATELET
Absolute Monocytes: 788 cells/uL (ref 200–950)
Basophils Absolute: 53 cells/uL (ref 0–200)
Basophils Relative: 0.7 %
Eosinophils Absolute: 353 cells/uL (ref 15–500)
Eosinophils Relative: 4.7 %
HCT: 34 % — ABNORMAL LOW (ref 35.0–45.0)
Hemoglobin: 11.6 g/dL — ABNORMAL LOW (ref 11.7–15.5)
Lymphs Abs: 2085 cells/uL (ref 850–3900)
MCH: 30.7 pg (ref 27.0–33.0)
MCHC: 34.1 g/dL (ref 32.0–36.0)
MCV: 89.9 fL (ref 80.0–100.0)
MPV: 8.4 fL (ref 7.5–12.5)
Monocytes Relative: 10.5 %
Neutro Abs: 4223 cells/uL (ref 1500–7800)
Neutrophils Relative %: 56.3 %
Platelets: 420 10*3/uL — ABNORMAL HIGH (ref 140–400)
RBC: 3.78 10*6/uL — ABNORMAL LOW (ref 3.80–5.10)
RDW: 12.6 % (ref 11.0–15.0)
Total Lymphocyte: 27.8 %
WBC: 7.5 10*3/uL (ref 3.8–10.8)

## 2022-05-04 LAB — URINALYSIS, ROUTINE W REFLEX MICROSCOPIC
Bilirubin Urine: NEGATIVE
Glucose, UA: NEGATIVE
Hgb urine dipstick: NEGATIVE
Ketones, ur: NEGATIVE
Leukocytes,Ua: NEGATIVE
Nitrite: NEGATIVE
Protein, ur: NEGATIVE
Specific Gravity, Urine: 1.011 (ref 1.001–1.035)
pH: 6.5 (ref 5.0–8.0)

## 2022-05-04 LAB — COMPLETE METABOLIC PANEL WITH GFR
AG Ratio: 1.6 (calc) (ref 1.0–2.5)
ALT: 27 U/L (ref 6–29)
AST: 28 U/L (ref 10–35)
Albumin: 4.4 g/dL (ref 3.6–5.1)
Alkaline phosphatase (APISO): 63 U/L (ref 37–153)
BUN: 14 mg/dL (ref 7–25)
CO2: 26 mmol/L (ref 20–32)
Calcium: 9.8 mg/dL (ref 8.6–10.4)
Chloride: 96 mmol/L — ABNORMAL LOW (ref 98–110)
Creat: 0.87 mg/dL (ref 0.60–0.95)
Globulin: 2.7 g/dL (calc) (ref 1.9–3.7)
Glucose, Bld: 92 mg/dL (ref 65–99)
Potassium: 4.7 mmol/L (ref 3.5–5.3)
Sodium: 130 mmol/L — ABNORMAL LOW (ref 135–146)
Total Bilirubin: 0.6 mg/dL (ref 0.2–1.2)
Total Protein: 7.1 g/dL (ref 6.1–8.1)
eGFR: 67 mL/min/{1.73_m2} (ref >=60)

## 2022-05-04 LAB — HEMOGLOBIN A1C
Hgb A1c MFr Bld: 5.5 % of total Hgb (ref <5.7)
Mean Plasma Glucose: 111 mg/dL
eAG (mmol/L): 6.2 mmol/L

## 2022-05-04 LAB — LIPID PANEL
Cholesterol: 213 mg/dL — ABNORMAL HIGH (ref <200)
HDL: 85 mg/dL (ref >=50)
LDL Cholesterol (Calc): 105 mg/dL (calc) — ABNORMAL HIGH
Non-HDL Cholesterol (Calc): 128 mg/dL (calc) (ref <130)
Total CHOL/HDL Ratio: 2.5 (calc) (ref <5.0)
Triglycerides: 134 mg/dL (ref <150)

## 2022-05-04 LAB — VITAMIN D 25 HYDROXY (VIT D DEFICIENCY, FRACTURES): Vit D, 25-Hydroxy: 76 ng/mL (ref 30–100)

## 2022-05-04 LAB — TSH: TSH: 1.48 mIU/L (ref 0.40–4.50)

## 2022-05-04 LAB — INSULIN, RANDOM: Insulin: 10 u[IU]/mL

## 2022-05-04 LAB — MAGNESIUM: Magnesium: 2.1 mg/dL (ref 1.5–2.5)

## 2022-05-04 NOTE — Progress Notes (Signed)
<><><><><><><><><><><><><><><><><><><><><><><><><><><><><><><><><> <><><><><><><><><><><><><><><><><><><><><><><><><><><><><><><><><> - Test results slightly outside the reference range are not unusual. If there is anything important, I will review this with you,  otherwise it is considered normal test values.  If you have further questions,  please do not hesitate to contact me at the office or via My Chart.  <><><><><><><><><><><><><><><><><><><><><><><><><><><><><><><><><> <><><><><><><><><><><><><><><><><><><><><><><><><><><><><><><><><>  -  CBC shows mild anemia - which is stable & about the same  <><><><><><><><><><><><><><><><><><><><><><><><><><><><><><><><><> <><><><><><><><><><><><><><><><><><><><><><><><><><><><><><><><><>  -  Total  Chol = 213  - Elevated             (  Ideal  or  Goal is less than 180  !  )  & -  Bad / Dangerous LDL  Chol =    105   - also Elevated              (  Ideal  or  Goal is less than 70  !  )   -Ned to work harder on low chol diet    - Cholesterol only comes from animal sources                                                                                     - ie. meat, dairy, egg yolks  - Eat all the vegetables you want.  - Avoid Meat, Avoid Meat,  Avoid Meat                                                                       - especially Red Meat - Beef AND Pork .  - Avoid cheese & dairy - milk & ice cream.     - Cheese is the most concentrated form of trans-fats which                                                                           is the worst thing to clog up our arteries.   - Veggie cheese is OK which can be found in the fresh                                        produce section at [employer] or [employer] or [employer]  <><><><><><><><><><><><><><><><><><><><><><><><><><><><><><><><><> <><><><><><><><><><><><><><><><><><><><><><><><><><><><><><><><><>  -  A1c - Normal - No Diabetes  -  Great  !  <><><><><><><><><><><><><><><><><><><><><><><><><><><><><><><><><> <><><><><><><><><><><><><><><><><><><><><><><><><><><><><><><><><>  -  Vitamin D = 76 - Excellent -- Please keep dose same  <><><><><><><><><><><><><><><><><><><><><><><><><><><><><><><><><> <><><><><><><><><><><><><><><><><><><><><><><><><><><><><><><><><>  -  All Else - CBC - Kidneys - Electrolytes - Liver - Magnesium & Thyroid    - all  Normal / OK <><><><><><><><><><><><><><><><><><><><><><><><><><><><><><><><><> <><><><><><><><><><><><><><><><><><><><><><><><><><><><><><><><><>

## 2022-05-05 ENCOUNTER — Other Ambulatory Visit: Payer: Self-pay | Admitting: Internal Medicine

## 2022-05-05 DIAGNOSIS — Z1231 Encounter for screening mammogram for malignant neoplasm of breast: Secondary | ICD-10-CM

## 2022-05-17 ENCOUNTER — Ambulatory Visit
Admission: RE | Admit: 2022-05-17 | Discharge: 2022-05-17 | Disposition: A | Discharge status: Home / Self Care | Payer: PPO | Source: Ambulatory Visit | Attending: Internal Medicine | Admitting: Internal Medicine

## 2022-05-17 DIAGNOSIS — Z1231 Encounter for screening mammogram for malignant neoplasm of breast: Secondary | ICD-10-CM

## 2022-06-13 ENCOUNTER — Other Ambulatory Visit: Payer: Self-pay | Admitting: Nurse Practitioner

## 2022-06-27 DIAGNOSIS — M170 Bilateral primary osteoarthritis of knee: Secondary | ICD-10-CM | POA: Diagnosis not present

## 2022-06-27 DIAGNOSIS — M25561 Pain in right knee: Secondary | ICD-10-CM | POA: Diagnosis not present

## 2022-06-27 HISTORY — DX: Pain in right knee: M25.561

## 2022-07-06 ENCOUNTER — Other Ambulatory Visit: Payer: Self-pay | Admitting: Internal Medicine

## 2022-07-06 DIAGNOSIS — I10 Essential (primary) hypertension: Secondary | ICD-10-CM

## 2022-07-06 MED ORDER — OLMESARTAN MEDOXOMIL 40 MG PO TABS: ORAL_TABLET | ORAL | 0 refills | Status: DC

## 2022-07-11 ENCOUNTER — Encounter: Payer: Self-pay | Admitting: Nurse Practitioner

## 2022-07-11 ENCOUNTER — Ambulatory Visit (INDEPENDENT_AMBULATORY_CARE_PROVIDER_SITE_OTHER): Payer: PPO | Admitting: Nurse Practitioner

## 2022-07-11 VITALS — BP 132/68 | HR 79 | Temp 97.5°F | Ht 62.0 in | Wt 141.4 lb

## 2022-07-11 DIAGNOSIS — R0609 Other forms of dyspnea: Secondary | ICD-10-CM

## 2022-07-11 DIAGNOSIS — J4521 Mild intermittent asthma with (acute) exacerbation: Secondary | ICD-10-CM | POA: Diagnosis not present

## 2022-07-11 DIAGNOSIS — R062 Wheezing: Secondary | ICD-10-CM

## 2022-07-11 DIAGNOSIS — R051 Acute cough: Secondary | ICD-10-CM

## 2022-07-11 MED ORDER — IPRATROPIUM-ALBUTEROL 0.5-2.5 (3) MG/3ML IN SOLN: 3.0000 mL | Freq: Once | RESPIRATORY_TRACT | Status: DC

## 2022-07-11 NOTE — Progress Notes (Signed)
Assessment and Plan:  Michaela Kelley was seen today for an episodic visit.  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  - ipratropium-albuterol (DUONEB) 0.5-2.5 (3) MG/3ML nebulizer solution 3 mL  Acute cough Stay well hydrated to keep any mucus thin an d productive. Coughing can be cuased by several factors including   breathing in things that bother (irritate) your lungs.  Allergies.  Asthma.  Mucus that runs down the back of your throat (postnasal drip).  Smoking/smoke.  Acid backing up from the stomach into the tube that moves food from the mouth to the stomach (gastroesophageal reflux). A cough can linger for 3 weeks. Watch for any changes in your cough and contact office if noticed including blood, pus, pain, night sweats. Cover your mouth when you cough. If the air is dry, use a cool mist vaporizer or humidifier in your home. If your cough is worse at night, try using extra pillows to raise your head up higher while you sleep. Call 911 or report to ER if you start to have difficulty breathing.    Notify office for further evaluation and treatment, questions or concerns if s/s fail to improve. The risks and benefits of my recommendations, as well as other treatment options were discussed with the patient today. Questions were answered.  Further disposition pending results of labs. Discussed med's effects and SE's.    Over 20 minutes of exam, counseling, chart review, and critical decision making was performed.   Future Appointments  Date Time Provider Department Center  08/17/2022 10:30 AM Adela Glimpse, NP [facility] None  11/23/2022  9:30 AM Lucky Cowboy, MD [facility] None  03/01/2023  9:30 AM Adela Glimpse, NP [facility] None  05/31/2023 10:00 AM Lucky Cowboy, MD [facility] None    ------------------------------------------------------------------------------------------------------------------   HPI BP 132/68   Pulse 79   Temp (!) 97.5 F (36.4 C)   Ht  (1.575 m)   Wt 141 lb 6.4 oz (64.1 kg)   SpO2 96%   BMI 25.86 kg/m   82 y.o.female presents for ongoing cough exacerbated by mild intermittent asthma with bronchitis. Does have SOB with wheezing.  She is not in distress. She has been using Ventolin inhaler with some relief.  Takes daily Singulair.  Denies any other associated symptoms including nausea congestion, fever, chills, N/V.  She is a former smoker, quit 1993.    Past Medical History:  Diagnosis Date   Allergy    Arthritis    Cancer    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   Cataract    GERD (gastroesophageal reflux disease)    Hx: UTI (urinary tract infection)    Hyperlipidemia    Hypertension    Prediabetes    Seasonal allergic rhinitis    Vitamin D deficiency      Allergies  Allergen Reactions   Atorvastatin     Myalgias    Codeine Nausea And Vomiting and Other (See Comments)    Severe headaches (tolerates Dilaudid)    Fosamax [Alendronate] Other (See Comments)    Severe arthrlalgias   Hydrocodone-Acetaminophen Nausea And Vomiting and Other (See Comments)    Severe headaches (tolerates Dilaudid)   Oxycodone-Acetaminophen Nausea And Vomiting and Other (See Comments)    Severe headaches (tolerates Dilaudid)    Current Outpatient Medications on File Prior to Visit  Medication Sig  acetaminophen (TYLENOL) 650 MG CR tablet Take 1,300 mg by mouth every 8 (eight) hours as needed for pain.   albuterol (VENTOLIN HFA) 108 (90 Base) MCG/ACT inhaler Use 2 inhalations  15 minutes Apart  every 4 hours  to Rescue Asthma   b complex vitamins capsule Take 1 capsule by mouth daily.   carboxymethylcellulose (REFRESH TEARS) 0.5 % SOLN Place 1 drop into both eyes 3 (three) times daily as needed (dry eyes).   Cholecalciferol  (VITAMIN D) 50 MCG (2000 UT) tablet Take 2,000 Units by mouth daily.   MAGNESIUM PO Take 500 mg by mouth daily. Magesium Oxide   montelukast (SINGULAIR) 10 MG tablet TAKE ONE TABLET DAILY FOR ALLERGY   olmesartan (BENICAR) 40 MG tablet Take  1 tablet  Daily  for BP   TURMERIC CURCUMIN PO Take 750 mg by mouth daily.   Aspirin 81 MG CAPS Take 1 capsule every day by oral route.   HYDROmorphone (DILAUDID) 2 MG tablet Take 1-2 tablets (2-4 mg total) by mouth every 6 (six) hours as needed for severe pain. (Patient not taking: Reported on 07/11/2022)   methocarbamol (ROBAXIN) 500 MG tablet Take 1 tablet (500 mg total) by mouth every 6 (six) hours as needed for muscle spasms. (Patient not taking: Reported on 05/03/2022)   No current facility-administered medications on file prior to visit.    ROS: all negative except what is noted in the HPI.   Physical Exam:  BP 132/68   Pulse 79   Temp (!) 97.5 F (36.4 C)   Ht  (1.575 m)   Wt 141 lb 6.4 oz (64.1 kg)   SpO2 96%   BMI 25.86 kg/m   General Appearance: NAD.  Awake, conversant and cooperative. Eyes: PERRLA, EOMs intact.  Sclera white.  Conjunctiva without erythema. Sinuses: No frontal/maxillary tenderness.  No nasal discharge. Nares patent.  ENT/Mouth: Ext aud canals clear.  Bilateral TMs w/DOL and without erythema or bulging. Hearing intact.  Posterior pharynx without swelling or exudate.  Tonsils without swelling or erythema.  Neck: Supple.  No masses, nodules or thyromegaly. Respiratory: Effort is regular with non-labored breathing. Breath sounds are equal bilaterally without rales, rhonchi, wheezing or stridor.  Cardio: RRR with no MRGs. Brisk peripheral pulses without edema.  Abdomen: Active BS in all four quadrants.  Soft and non-tender without guarding, rebound tenderness, hernias or masses. Lymphatics: Non tender without lymphadenopathy.  Musculoskeletal: Full ROM, 5/5 strength, normal ambulation.  No clubbing or cyanosis. Skin:  Appropriate color for ethnicity. Warm without rashes, lesions, ecchymosis, ulcers.  Neuro: CN II-XII grossly normal. Normal muscle tone without cerebellar symptoms and intact sensation.   Psych: AO X 3,  appropriate mood and affect, insight and judgment.     Adela Glimpse, NP 4:27 PM [facility]

## 2022-07-14 ENCOUNTER — Encounter: Payer: Self-pay | Admitting: Nurse Practitioner

## 2022-07-14 ENCOUNTER — Telehealth: Payer: Self-pay | Admitting: Nurse Practitioner

## 2022-07-14 NOTE — Telephone Encounter (Signed)
Patient recently saw you for asthmatic bronchitis. She has been using the inhaler you prescribed for her but her cough isn't getting any better and it is now "productive." Please advise

## 2022-07-15 ENCOUNTER — Encounter: Payer: Self-pay | Admitting: Nurse Practitioner

## 2022-07-15 MED ORDER — AZITHROMYCIN 250 MG PO TABS: ORAL_TABLET | ORAL | 1 refills | Status: DC

## 2022-07-17 ENCOUNTER — Encounter: Payer: Self-pay | Admitting: Nurse Practitioner

## 2022-07-17 NOTE — Patient Instructions (Signed)
  Acute Bronchitis, Adult  Acute bronchitis is when air tubes in the lungs (bronchi) suddenly get swollen. The condition can make it hard for you to breathe. In adults, acute bronchitis usually goes away within 2 weeks. A cough caused by bronchitis may last up to 3 weeks. Smoking, allergies, and asthma can make the condition worse. What are the causes? Germs that cause cold and flu (viruses). The most common cause of this condition is the virus that causes the common cold. Bacteria. Substances that bother (irritate) the lungs, including: Smoke from cigarettes and other types of tobacco. Dust and pollen. Fumes from chemicals, gases, or burned fuel. Indoor or outdoor air pollution. What increases the risk? A weak body's defense system. This is also called the immune system. Any condition that affects your lungs and breathing, such as asthma. What are the signs or symptoms? A cough. Coughing up clear, yellow, or green mucus. Making high-pitched whistling sounds when you breathe, most often when you breathe out (wheezing). Runny or stuffy nose. Having too much mucus in your lungs (chest congestion). Shortness of breath. Body aches. A sore throat. How is this treated? Acute bronchitis may go away over time without treatment. Your doctor may tell you to: Drink more fluids. This will help thin your mucus so it is easier to cough up. Use a device that gets medicine into your lungs (inhaler). Use a vaporizer or a humidifier. These are machines that add water to the air. This helps with coughing and poor breathing. Take a medicine that thins mucus and helps clear it from your lungs. Take a medicine that prevents or stops coughing. It is not common to take an antibiotic medicine for this condition. Follow these instructions at home:  Take over-the-counter and prescription medicines only as told by your doctor. Use an inhaler, vaporizer, or humidifier as told by your doctor. Take two  teaspoons (10 mL) of honey at bedtime. This helps lessen your coughing at night. Drink enough fluid to keep your pee (urine) pale yellow. Do not smoke or use any products that contain nicotine or tobacco. If you need help quitting, ask your doctor. Get a lot of rest. Return to your normal activities when your doctor says that it is safe. Keep all follow-up visits. How is this prevented?  Wash your hands often with soap and water for at least 20 seconds. If you cannot use soap and water, use hand sanitizer. Avoid contact with people who have cold symptoms. Try not to touch your mouth, nose, or eyes with your hands. Avoid breathing in smoke or chemical fumes. Make sure to get the flu shot every year. Contact a doctor if: Your symptoms do not get better in 2 weeks. You have trouble coughing up the mucus. Your cough keeps you awake at night. You have a fever. Get help right away if: You cough up blood. You have chest pain. You have very bad shortness of breath. You faint or keep feeling like you are going to faint. You have a very bad headache. Your fever or chills get worse. These symptoms may be an emergency. Get help right away. Call your local emergency services (911 in the [place] Do not wait to see if the symptoms will go away. Do not drive yourself to the hospital. Summary Acute bronchitis is when air tubes in the lungs (bronchi) suddenly get swollen. In adults, acute bronchitis usually goes away within 2 weeks. Drink more fluids. This will help thin your mucus so it   is easier to cough up. Take over-the-counter and prescription medicines only as told by your doctor. Contact a doctor if your symptoms do not improve after 2 weeks of treatment. This information is not intended to replace advice given to you by your health care provider. Make sure you discuss any questions you have with your health care provider. Document Revised: 07/15/2020 Document Reviewed: 07/15/2020 [employer]vier  Patient Education  2023 [employer]  

## 2022-07-19 DIAGNOSIS — M1711 Unilateral primary osteoarthritis, right knee: Secondary | ICD-10-CM | POA: Diagnosis not present

## 2022-07-27 DIAGNOSIS — M1711 Unilateral primary osteoarthritis, right knee: Secondary | ICD-10-CM | POA: Diagnosis not present

## 2022-08-02 DIAGNOSIS — M1711 Unilateral primary osteoarthritis, right knee: Secondary | ICD-10-CM | POA: Diagnosis not present

## 2022-08-17 ENCOUNTER — Ambulatory Visit: Payer: PPO | Admitting: Nurse Practitioner

## 2022-08-18 ENCOUNTER — Other Ambulatory Visit: Payer: Self-pay | Admitting: Internal Medicine

## 2022-08-18 ENCOUNTER — Encounter: Payer: Self-pay | Admitting: Nurse Practitioner

## 2022-08-18 MED ORDER — DEXAMETHASONE 4 MG PO TABS: ORAL_TABLET | ORAL | 0 refills | Status: DC

## 2022-08-18 MED ORDER — PROMETHAZINE-DM 6.25-15 MG/5ML PO SYRP: ORAL_SOLUTION | ORAL | 1 refills | Status: DC

## 2022-08-19 DIAGNOSIS — H35372 Puckering of macula, left eye: Secondary | ICD-10-CM | POA: Diagnosis not present

## 2022-08-19 DIAGNOSIS — H52203 Unspecified astigmatism, bilateral: Secondary | ICD-10-CM | POA: Diagnosis not present

## 2022-08-19 DIAGNOSIS — Z961 Presence of intraocular lens: Secondary | ICD-10-CM | POA: Diagnosis not present

## 2022-08-24 ENCOUNTER — Encounter: Payer: Self-pay | Admitting: Nurse Practitioner

## 2022-08-24 ENCOUNTER — Ambulatory Visit (INDEPENDENT_AMBULATORY_CARE_PROVIDER_SITE_OTHER): Payer: PPO | Admitting: Nurse Practitioner

## 2022-08-24 VITALS — BP 138/78 | HR 87 | Temp 97.8°F | Ht 62.0 in | Wt 141.0 lb

## 2022-08-24 DIAGNOSIS — M810 Age-related osteoporosis without current pathological fracture: Secondary | ICD-10-CM | POA: Diagnosis not present

## 2022-08-24 DIAGNOSIS — I771 Stricture of artery: Secondary | ICD-10-CM | POA: Diagnosis not present

## 2022-08-24 DIAGNOSIS — E559 Vitamin D deficiency, unspecified: Secondary | ICD-10-CM | POA: Diagnosis not present

## 2022-08-24 DIAGNOSIS — N9489 Other specified conditions associated with female genital organs and menstrual cycle: Secondary | ICD-10-CM

## 2022-08-24 DIAGNOSIS — Z860100 Personal history of colon polyps, unspecified: Secondary | ICD-10-CM

## 2022-08-24 DIAGNOSIS — R0989 Other specified symptoms and signs involving the circulatory and respiratory systems: Secondary | ICD-10-CM

## 2022-08-24 DIAGNOSIS — T7840XD Allergy, unspecified, subsequent encounter: Secondary | ICD-10-CM

## 2022-08-24 DIAGNOSIS — I10 Essential (primary) hypertension: Secondary | ICD-10-CM

## 2022-08-24 DIAGNOSIS — M1611 Unilateral primary osteoarthritis, right hip: Secondary | ICD-10-CM | POA: Diagnosis not present

## 2022-08-24 DIAGNOSIS — Z0001 Encounter for general adult medical examination with abnormal findings: Secondary | ICD-10-CM

## 2022-08-24 DIAGNOSIS — R6889 Other general symptoms and signs: Secondary | ICD-10-CM | POA: Diagnosis not present

## 2022-08-24 DIAGNOSIS — J302 Other seasonal allergic rhinitis: Secondary | ICD-10-CM

## 2022-08-24 DIAGNOSIS — Z0000 Encounter for general adult medical examination without abnormal findings: Secondary | ICD-10-CM

## 2022-08-24 DIAGNOSIS — D649 Anemia, unspecified: Secondary | ICD-10-CM | POA: Diagnosis not present

## 2022-08-24 DIAGNOSIS — I8392 Asymptomatic varicose veins of left lower extremity: Secondary | ICD-10-CM | POA: Diagnosis not present

## 2022-08-24 DIAGNOSIS — M1711 Unilateral primary osteoarthritis, right knee: Secondary | ICD-10-CM

## 2022-08-24 DIAGNOSIS — Z79899 Other long term (current) drug therapy: Secondary | ICD-10-CM | POA: Diagnosis not present

## 2022-08-24 DIAGNOSIS — E785 Hyperlipidemia, unspecified: Secondary | ICD-10-CM | POA: Diagnosis not present

## 2022-08-24 DIAGNOSIS — E871 Hypo-osmolality and hyponatremia: Secondary | ICD-10-CM

## 2022-08-24 MED ORDER — PREDNISONE 10 MG PO TABS: ORAL_TABLET | ORAL | 0 refills | Status: DC

## 2022-08-24 NOTE — Patient Instructions (Signed)

## 2022-08-24 NOTE — Progress Notes (Signed)
MEDICARE ANNUAL WELLNESS  Assessment:   Annual Medicare Wellness Visit Due annually  Health maintenance reviewed   Essential hypertension Discussed DASH (Dietary Approaches to Stop Hypertension) DASH diet is lower in sodium than a typical American diet. Cut back on foods that are high in saturated fat, cholesterol, and trans fats. Eat more whole-grain foods, fish, poultry, and nuts Remain active and exercise as tolerated daily.  Monitor BP at home-Call if greater than 130/80.  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   Hyperlipidemia, unspecified hyperlipidemia type Unable to tolearate atorvastatin, zetia Discussed lifestyle modifications. Recommended diet heavy in fruits and veggies, omega 3's. Decrease consumption of animal meats, cheeses, and dairy products. Remain active and exercise as tolerated. Continue to monitor. Check lipids/TSH  Medication management All medications discussed and reviewed in full. All questions and concerns regarding medications addressed.    Vitamin D deficiency Continue supplement for goal of 60-100 Monitor Vitamin D levels  Seasonal allergic rhinitis, unspecified trigger Continue meds, singulair and PRN zyrtec  Avoid triggers  Pelvic congestion syndrome Incidentally noted on imaging; denies pelvic or vaginal sx; monitor   Osteoporosis without current pathological fracture, unspecified osteoporosis type Pursue a combination of weight-bearing exercises and strength training. Patients with severe mobility impairment should be referred for physical therapy. Advised on fall prevention measures including proper lighting in all rooms, removal of area rugs and floor clutter, use of walking devices as deemed appropriate, avoidance of uneven walking surfaces. Smoking cessation and moderate alcohol consumption if applicable Consume 800 to 1000 IU of vitamin D daily  with a goal vitamin D serum value of 30 ng/mL or higher. Aim for 1000 to 1200 mg of elemental calcium daily through supplements and/or dietary sources.  Osteoarthritis, R knee Stable Ortho following RICE when flared Continue to monitor  Varicose vein, asymptomatic Compression hose encouraged; monitor  History of colon polyps 5 year recall due 08/2021 was optional per Dr. Jarold Motto Extended discussion today of risks and benefits Patient would like to proceed with referral back due to excellent life expectancy High fiber diet, low red/processed meat encouraged Refer to GI  Low Hemoglobin Monitor CBC and Iron, TIBC, Ferritin  Chronic hyponatremia Monitor Na Discussed ways to incorporate salt into diet.    Chest congestion/allergies Change Dexamethasone to Prednisone Continue use of Albuterol inhaler Daily antihistamine  Orders Placed This Encounter  Procedures   CBC with Differential/Platelet   COMPLETE METABOLIC PANEL WITH GFR   Lipid panel   VITAMIN D 25 Hydroxy (Vit-D Deficiency, Fractures)   Iron, TIBC and Ferritin Panel   Ambulatory referral to Gastroenterology    Referral Priority:   Routine    Referral Type:   Consultation    Referral Reason:   Specialty Services Required    Referred to Provider:   Mardella Layman, MD    Number of Visits Requested:   1    Notify office for further evaluation and treatment, questions or concerns if any reported s/s fail to improve.   The patient was advised to call back or seek an in-person evaluation if any symptoms worsen or if the condition fails to improve as anticipated.   Further disposition pending results of labs. Discussed med's effects and SE's.    I discussed the assessment and treatment plan with the patient. The patient was provided an opportunity to ask questions and all were answered. The patient agreed with the plan and demonstrated an understanding of the instructions.  Discussed med's effects and SE's.  Screening labs and tests as requested with regular follow-up as recommended.  I provided 35 minutes of face-to-face time during this encounter including counseling, chart review, and critical decision making was preformed.  Today's Plan of Care is based on a patient-centered health care approach known as shared decision making - the decisions, tests and treatments allow for patient preferences and values to be balanced with clinical evidence.    Future Appointments  Date Time Provider Department Center  11/23/2022  9:30 AM Lucky Cowboy, MD [facility] None  03/01/2023  9:30 AM Adela Glimpse, NP [facility] None  05/31/2023 10:00 AM Lucky Cowboy, MD [facility] None     Subjective:  MIGNONNE REISING is a 82 y.o. female who presents for AWV and follow up. She has Hyperlipidemia; Essential hypertension; Seasonal allergic rhinitis; Vitamin D deficiency; Medication management; Tortuous aorta (HCC); Osteoporosis; Osteoarthritis of right knee; Diverticulosis; Pelvic congestion syndrome; B12 deficiency; History of colon polyps; Asymptomatic varicose veins of left lower extremity; and Osteoarthritis of right hip on their problem list.  Overall she reports feeling well however she has been having cough and congestion that she feels is related to seasonal allergies.  She was prescribed Dexamethasone last week but feels as though it is too strong causing HA.  She continues Singulair.   She had abdominal pain, CT abd 01/15/2020 showed focal diverticulitis involving the distal sigmoid colon, she responded well to abx. Also incidentally noted enlarged left-sided periuterine veins are noted within large left ovarian vein, consistent with pelvic congestion syndrome, continues to deny notable vaginal or pelvic sx.   She has orthotic for left foot pain, seeing Dr. Charlsie Merles, doing well recently.  Follows with [facility], Dr. Penni Bombard for knee injections.  Will take celebrex occasionally, will do topical CBD. Also  getting R hip injections, which she reports has helped.   BMI is Body mass index is 25.79 kg/m., she has been working on diet and exercise.  Wt Readings from Last 3 Encounters:  08/24/22 141 lb (64 kg)  07/11/22 141 lb 6.4 oz (64.1 kg)  05/03/22 140 lb 12.8 oz (63.9 kg)   She has broken blood vessels bilateral legs, worse left leg, no pain, wearing compression.    Her blood pressure has been controlled at home (labile, ranges 110s/70s-150s/80s) today their BP is BP: 138/78. She does workout, walking, stretching, water aerobics and golfing.  She denies chest pain, shortness of breath, dizziness.  She is not on cholesterol medication, on omega 3 supplement, she is off lipitor due to myalgias, zetia was started but patient decided to stop d/t SE. Her cholesterol is not at goal. The cholesterol last visit was:   Lab Results  Component Value Date   CHOL 213 (H) 05/03/2022   HDL 85 05/03/2022   LDLCALC 105 (H) 05/03/2022   TRIG 134 05/03/2022   CHOLHDL 2.5 05/03/2022    She has been working on diet and exercise for glucose management, and denies paresthesia of the feet, polydipsia, polyuria and visual disturbances. Last A1C in the office was:  Lab Results  Component Value Date   HGBA1C 5.5 05/03/2022    She has stable CKD II monitored at this office:  Lab Results  Component Value Date   EGFR 67 05/03/2022   Patient is on Vitamin D supplement taking 5000 IU daily    Lab Results  Component Value Date   VD25OH 76 05/03/2022   She did start on SL B12 supplement  Lab Results  Component Value Date   VITAMINB12 308 10/01/2021     Medication Review: Current Outpatient Medications on File Prior to Visit  Medication Sig Dispense Refill   acetaminophen (TYLENOL) 650 MG CR tablet Take 1,300 mg by mouth every 8 (eight) hours as needed for pain.     albuterol (VENTOLIN HFA) 108 (90 Base) MCG/ACT inhaler Use 2 inhalations  15 minutes Apart  every 4 hours  to Rescue Asthma 48 g 3   Aspirin  81 MG CAPS Take 1 capsule every day by oral route.     b complex vitamins capsule Take 1 capsule by mouth daily.     carboxymethylcellulose (REFRESH TEARS) 0.5 % SOLN Place 1 drop into both eyes 3 (three) times daily as needed (dry eyes).     Cholecalciferol (VITAMIN D) 50 MCG (2000 UT) tablet Take 2,000 Units by mouth daily.     MAGNESIUM PO Take 500 mg by mouth daily. Magesium Oxide     montelukast (SINGULAIR) 10 MG tablet TAKE ONE TABLET DAILY FOR ALLERGY 90 tablet 3   olmesartan (BENICAR) 40 MG tablet Take  1 tablet  Daily  for BP 10 tablet 0   TURMERIC CURCUMIN PO Take 750 mg by mouth daily.     promethazine-dextromethorphan (PROMETHAZINE-DM) 6.25-15 MG/5ML syrup Take 1 tsp every 4 hours if needed for cough (Patient not taking: Reported on 08/24/2022) 240 mL 1   Current Facility-Administered Medications on File Prior to Visit  Medication Dose Route Frequency Provider Last Rate Last Admin   ipratropium-albuterol (DUONEB) 0.5-2.5 (3) MG/3ML nebulizer solution 3 mL  3 mL Nebulization Once Adela Glimpse, NP        Current Problems (verified) Patient Active Problem List   Diagnosis Date Noted   Osteoarthritis of right hip 01/17/2022   History of colon polyps 10/01/2021   Asymptomatic varicose veins of left lower extremity 10/01/2021   B12 deficiency 02/12/2020   Diverticulosis 01/15/2020   Pelvic congestion syndrome 01/15/2020   Osteoarthritis of right knee 07/03/2019   Osteoporosis 02/01/2018   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   Medication management 10/03/2013   Seasonal allergic rhinitis    Vitamin D deficiency    Hyperlipidemia 01/02/2008   Essential hypertension 01/02/2008    Screening Tests Immunization History  Administered Date(s) Administered   Influenza, High Dose Seasonal PF 03/10/2015, 01/10/2017, 02/01/2018, 02/12/2019, 02/13/2020, 01/28/2021, 03/23/2022   PFIZER(Purple Top)SARS-COV-2 Vaccination 04/16/2019, 05/07/2019, 04/03/2020   Pneumococcal Conjugate-13  05/05/2014   Pneumococcal Polysaccharide-23 06/28/2006   Td 05/27/2011   Health Maintenance  Topic Date Due   Zoster Vaccines- Shingrix (1 of 2) Never done   DTaP/Tdap/Td (2 - Tdap) 05/26/2021   COVID-19 Vaccine (4 - 2023-24 season) 11/26/2021   INFLUENZA VACCINE  10/27/2022   Medicare Annual Wellness (AWV)  08/24/2023   Pneumonia Vaccine 58+ Years old  Completed   DEXA SCAN  Completed   HPV VACCINES  Aged Out    Tetanus: 2013 will get PRN Shingrix: check with insurance and get at pharmacy  Covid 19: 2/2, 2021, pfizer + booster  MGM: 05/17/2022 DEXA 02/24/2020 - L forearm only, didn't tolerate fosamax, will do lifestyle - due - defers  at this time would not pursue further treatment.    Colonoscopy: 08/2016 (Dr. Jarold Motto) - tubular adenoma x 1 -  5 year follow up if desired - she will reach out to schedule.   Names of Other Physician/Practitioners you currently use: 1. [facility] here for primary care 2. Dr. Honor Loh, eye  doctor, 07/2021 3. Dr. Russella Dar, dentist, last 2023, goes q6 4. Dr. Jarold Motto, GI 5. [facility] 6. Dr. Barbaraann Cao, derm, PRN  Patient Care Team: Lucky Cowboy, MD as PCP - General (Internal Medicine) Charlett Nose, [facility] (Inactive) as Pharmacist (Pharmacist)    Allergies Allergies  Allergen Reactions   Atorvastatin     Myalgias    Codeine Nausea And Vomiting and Other (See Comments)    Severe headaches (tolerates Dilaudid)    Fosamax [Alendronate] Other (See Comments)    Severe arthrlalgias   Hydrocodone-Acetaminophen Nausea And Vomiting and Other (See Comments)    Severe headaches (tolerates Dilaudid)   Oxycodone-Acetaminophen Nausea And Vomiting and Other (See Comments)    Severe headaches (tolerates Dilaudid)   Voltaren [Diclofenac] Rash    Gel    SURGICAL HISTORY She  has a past surgical history that includes Knee Arthroplasty (2010); Dilation and curettage of uterus; Tonsillectomy; Back  surgery (1999); Total shoulder arthroplasty (02/10/2011); Total shoulder arthroplasty (12/22/2011); and Total hip arthroplasty (Right, 01/17/2022). FAMILY HISTORY Her family history includes Breast cancer in her paternal grandmother; Breast cancer (age of onset: 76) in her mother; Diabetes in her paternal grandfather; Esophageal cancer in her brother; Heart disease in her brother, maternal grandfather, and maternal grandmother; Hypertension in her father. SOCIAL HISTORY She  reports that she quit smoking about 30 years ago. Her smoking use included cigarettes. She has a 5.00 pack-year smoking history. She has never used smokeless tobacco. She reports current alcohol use of about 8.0 standard drinks of alcohol per week. She reports that she does not use drugs.  MEDICARE WELLNESS OBJECTIVES: Physical activity:   Cardiac risk factors:   Depression/mood screen:      08/24/2022    9:28 AM  Depression screen PHQ 2/9  Decreased Interest 0  Down, Depressed, Hopeless 0  PHQ - 2 Score 0    ADLs:     08/24/2022    9:28 AM 05/02/2022   10:43 PM  In your present state of health, do you have any difficulty performing the following activities:  Hearing? 0 0  Vision? 0 0  Difficulty concentrating or making decisions? 0 0  Walking or climbing stairs? 0 0  Dressing or bathing? 0 0  Doing errands, shopping? 0 0     Cognitive Testing  Alert? Yes  Normal Appearance?Yes  Oriented to person? Yes  Place? Yes   Time? Yes  Recall of three objects?  Yes  Can perform simple calculations? Yes  Displays appropriate judgment?Yes  Can read the correct time from a watch face?Yes  EOL planning:        Review of Systems  Constitutional:  Negative for malaise/fatigue and weight loss.  HENT:  Negative for hearing loss and tinnitus.   Eyes:  Negative for blurred vision and double vision.  Respiratory:  Negative for cough, sputum production, shortness of breath and wheezing.   Cardiovascular:  Negative for  chest pain, palpitations, orthopnea, claudication, leg swelling and PND.  Gastrointestinal:  Negative for abdominal pain, blood in stool, constipation, diarrhea, heartburn, melena, nausea and vomiting.  Genitourinary: Negative.   Musculoskeletal:  Positive for joint pain. Negative for falls and myalgias. Back pain: R knee, R hip, ortho follows for injections. Skin:  Negative for rash.  Neurological:  Negative for dizziness, tingling, sensory change, weakness and headaches.  Endo/Heme/Allergies:  Negative for polydipsia.  Psychiatric/Behavioral: Negative.  Negative for depression, memory loss, substance abuse and suicidal ideas. The patient is not nervous/anxious and does not have insomnia.  All other systems reviewed and are negative.    Objective:     Blood pressure 138/78, pulse 87, temperature 97.8 F (36.6 C), height 5\' 2"  (1.575 m), weight 141 lb (64 kg), SpO2 99 %. Body mass index is 25.79 kg/m.  General appearance: alert, no distress, WD/WN, female HEENT: normocephalic, sclerae anicteric, TMs pearly, nares patent, no discharge or erythema, pharynx normal Oral cavity: MMM, no lesions Neck: supple, no lymphadenopathy, no thyromegaly, no masses Heart: RRR, normal S1, S2, no murmurs Lungs: CTA bilaterally, no wheezes, rhonchi, or rales Abdomen: +bs, soft, non tender, non distended, no masses, no hepatomegaly, no splenomegaly Musculoskeletal: nontender, no obvious deformity, non-antalgic gait. Very mild R knee effusion without heat or erythema.  Extremities: no edema, no cyanosis, no clubbing. Left leg with non-tender enlarged varicose vein to medial thigh Pulses: 2+ symmetric, upper and lower extremities, normal cap refill Neurological: alert, oriented x 3, CN2-12 intact, strength normal upper extremities and lower extremities, sensation normal throughout, DTRs 2+ throughout, no cerebellar signs, gait normal Psychiatric: normal affect, behavior normal, pleasant  Skin: warm, dry,  intact; no lesions or rashes   Medicare Attestation I have personally reviewed: The patient's medical and social history Their use of alcohol, tobacco or illicit drugs Their current medications and supplements The patient's functional ability including ADLs,fall risks, home safety risks, cognitive, and hearing and visual impairment Diet and physical activities Evidence for depression or mood disorders  The patient's weight, height, BMI, and visual acuity have been recorded in the chart.  I have made referrals, counseling, and provided education to the patient based on review of the above and I have provided the patient with a written personalized care plan for preventive services.     Adela Glimpse, NP   08/24/2022

## 2022-08-25 LAB — IRON,TIBC AND FERRITIN PANEL
%SAT: 27 % (calc) (ref 16–45)
Ferritin: 304 ng/mL — ABNORMAL HIGH (ref 16–288)
Iron: 99 ug/dL (ref 45–160)
TIBC: 368 mcg/dL (calc) (ref 250–450)

## 2022-08-25 LAB — COMPLETE METABOLIC PANEL WITH GFR
AG Ratio: 1.8 (calc) (ref 1.0–2.5)
ALT: 27 U/L (ref 6–29)
AST: 19 U/L (ref 10–35)
Albumin: 4.4 g/dL (ref 3.6–5.1)
Alkaline phosphatase (APISO): 62 U/L (ref 37–153)
BUN: 14 mg/dL (ref 7–25)
CO2: 27 mmol/L (ref 20–32)
Calcium: 9.6 mg/dL (ref 8.6–10.4)
Chloride: 98 mmol/L (ref 98–110)
Creat: 0.92 mg/dL (ref 0.60–0.95)
Globulin: 2.4 g/dL (calc) (ref 1.9–3.7)
Glucose, Bld: 88 mg/dL (ref 65–139)
Potassium: 4.8 mmol/L (ref 3.5–5.3)
Sodium: 133 mmol/L — ABNORMAL LOW (ref 135–146)
Total Bilirubin: 0.4 mg/dL (ref 0.2–1.2)
Total Protein: 6.8 g/dL (ref 6.1–8.1)
eGFR: 63 mL/min/{1.73_m2} (ref >=60)

## 2022-08-25 LAB — CBC WITH DIFFERENTIAL/PLATELET
Absolute Monocytes: 637 cells/uL (ref 200–950)
Basophils Absolute: 49 cells/uL (ref 0–200)
Basophils Relative: 0.7 %
Eosinophils Absolute: 483 cells/uL (ref 15–500)
Eosinophils Relative: 6.9 %
HCT: 37.8 % (ref 35.0–45.0)
Hemoglobin: 12.4 g/dL (ref 11.7–15.5)
Lymphs Abs: 2569 cells/uL (ref 850–3900)
MCH: 30.8 pg (ref 27.0–33.0)
MCHC: 32.8 g/dL (ref 32.0–36.0)
MCV: 94 fL (ref 80.0–100.0)
MPV: 8.5 fL (ref 7.5–12.5)
Monocytes Relative: 9.1 %
Neutro Abs: 3262 cells/uL (ref 1500–7800)
Neutrophils Relative %: 46.6 %
Platelets: 368 10*3/uL (ref 140–400)
RBC: 4.02 10*6/uL (ref 3.80–5.10)
RDW: 12.7 % (ref 11.0–15.0)
Total Lymphocyte: 36.7 %
WBC: 7 10*3/uL (ref 3.8–10.8)

## 2022-08-25 LAB — VITAMIN D 25 HYDROXY (VIT D DEFICIENCY, FRACTURES): Vit D, 25-Hydroxy: 56 ng/mL (ref 30–100)

## 2022-08-25 LAB — LIPID PANEL
Cholesterol: 223 mg/dL — ABNORMAL HIGH (ref <200)
HDL: 82 mg/dL (ref >=50)
LDL Cholesterol (Calc): 106 mg/dL (calc) — ABNORMAL HIGH
Non-HDL Cholesterol (Calc): 141 mg/dL (calc) — ABNORMAL HIGH (ref <130)
Total CHOL/HDL Ratio: 2.7 (calc) (ref <5.0)
Triglycerides: 232 mg/dL — ABNORMAL HIGH (ref <150)

## 2022-08-30 DIAGNOSIS — L570 Actinic keratosis: Secondary | ICD-10-CM | POA: Diagnosis not present

## 2022-08-30 DIAGNOSIS — L814 Other melanin hyperpigmentation: Secondary | ICD-10-CM | POA: Diagnosis not present

## 2022-08-30 DIAGNOSIS — L438 Other lichen planus: Secondary | ICD-10-CM | POA: Diagnosis not present

## 2022-09-19 ENCOUNTER — Encounter: Payer: Self-pay | Admitting: Nurse Practitioner

## 2022-09-20 ENCOUNTER — Ambulatory Visit (INDEPENDENT_AMBULATORY_CARE_PROVIDER_SITE_OTHER): Payer: PPO | Admitting: Nurse Practitioner

## 2022-09-20 ENCOUNTER — Encounter: Payer: Self-pay | Admitting: Nurse Practitioner

## 2022-09-20 ENCOUNTER — Ambulatory Visit
Admission: RE | Admit: 2022-09-20 | Discharge: 2022-09-20 | Disposition: A | Discharge status: Home / Self Care | Payer: PPO | Source: Ambulatory Visit | Attending: Nurse Practitioner | Admitting: Nurse Practitioner

## 2022-09-20 VITALS — BP 130/68 | HR 81 | Temp 97.5°F | Ht 62.0 in | Wt 138.2 lb

## 2022-09-20 DIAGNOSIS — R0602 Shortness of breath: Secondary | ICD-10-CM | POA: Diagnosis not present

## 2022-09-20 DIAGNOSIS — R062 Wheezing: Secondary | ICD-10-CM

## 2022-09-20 DIAGNOSIS — J4520 Mild intermittent asthma, uncomplicated: Secondary | ICD-10-CM

## 2022-09-20 DIAGNOSIS — J069 Acute upper respiratory infection, unspecified: Secondary | ICD-10-CM | POA: Diagnosis not present

## 2022-09-20 DIAGNOSIS — R051 Acute cough: Secondary | ICD-10-CM | POA: Diagnosis not present

## 2022-09-20 DIAGNOSIS — R053 Chronic cough: Secondary | ICD-10-CM | POA: Diagnosis not present

## 2022-09-20 MED ORDER — TRELEGY ELLIPTA 200-62.5-25 MCG/ACT IN AEPB: 1.0000 | INHALATION_SPRAY | Freq: Every day | RESPIRATORY_TRACT | 2 refills | Status: DC

## 2022-09-20 NOTE — Patient Instructions (Signed)

## 2022-09-20 NOTE — Progress Notes (Signed)
Assessment and Plan:  Michaela Kelley was seen today for an episodic visit.  Diagnoses and all order for this visit:  Upper respiratory tract infection, unspecified type Obtain CXR for further review and evaluation given length of symptoms.   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  Dispense: 28 each; Refill: 2  Short of breath on exertion/Wheezing  Report to ER for any increase in difficulty breathing  - DG Chest 2 View; Future   Notify office for further evaluation and treatment, questions or concerns if any reported s/s fail to improve.   The patient was advised to call back or seek an in-person evaluation if any symptoms worsen or if the condition fails to improve as anticipated.   Further disposition pending results of labs. Discussed med's effects and SE's.    I discussed the assessment and treatment plan with the patient. The patient was provided an opportunity to ask questions and all were answered. The patient agreed with the plan and demonstrated an understanding of the instructions.  Discussed med's effects and SE's. Screening labs and tests as requested with regular follow-up as recommended.  I provided 20 minutes of face-to-face time during this encounter including counseling, chart review, and critical decision making was preformed.  Today's Plan of Care is based on a patient-centered health care approach known as shared decision making - the decisions, tests and treatments allow for patient preferences and values to be balanced with clinical evidence.     Future Appointments  Date Time Provider Department Center  11/24/2022 10:30 AM Michaela Cowboy, MD [facility] None   03/01/2023  9:30 AM Adela Glimpse, NP [facility] None  05/31/2023 10:00 AM Michaela Cowboy, MD [facility] None    ------------------------------------------------------------------------------------------------------------------   HPI BP 130/68   Pulse 81   Temp (!) 97.5 F (36.4 C)   Ht 5\' 2"  (1.575 m)   Wt 138 lb 3.2 oz (62.7 kg)   SpO2 99%   BMI 25.28 kg/m   82 y.o.female presents for ongoing cough exacerbated by mild intermittent asthma with bronchitis and fatigue. Does have SOB with wheezing.  She is not in distress. She has been using Ventolin inhaler with some relief.  Takes daily Singulair.  Does not take daily antihistamine.  She shares that these symptoms are triggered [place].  Denies any other associated symptoms including nausea fever, chills, N/V.  She is a former smoker, quit 1993.    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   Cataract    GERD (gastroesophageal reflux disease)    Hx: UTI (urinary tract infection)    Hyperlipidemia    Hypertension    Prediabetes    Seasonal allergic rhinitis    Vitamin D deficiency      Allergies  Allergen Reactions   Atorvastatin     Myalgias    Codeine Nausea And Vomiting and Other (See Comments)    Severe headaches (tolerates Dilaudid)    Fosamax [Alendronate] Other (See Comments)    Severe arthrlalgias   Hydrocodone-Acetaminophen Nausea And Vomiting and Other (See Comments)    Severe headaches (tolerates Dilaudid)   Oxycodone-Acetaminophen Nausea  And Vomiting and Other (See Comments)    Severe headaches (tolerates Dilaudid)   Voltaren [Diclofenac] Rash    Gel    Current Outpatient Medications on File Prior to Visit  Medication Sig   acetaminophen (TYLENOL) 650 MG CR tablet Take 1,300 mg by mouth every 8 (eight) hours as needed for pain.   albuterol (VENTOLIN HFA) 108 (90 Base) MCG/ACT inhaler Use 2 inhalations  15 minutes  Apart  every 4 hours  to Rescue Asthma   Aspirin 81 MG CAPS Take 1 capsule every day by oral route.   b complex vitamins capsule Take 1 capsule by mouth daily.   carboxymethylcellulose (REFRESH TEARS) 0.5 % SOLN Place 1 drop into both eyes 3 (three) times daily as needed (dry eyes).   Cholecalciferol (VITAMIN D) 50 MCG (2000 UT) tablet Take 2,000 Units by mouth daily.   MAGNESIUM PO Take 500 mg by mouth daily. Magesium Oxide   montelukast (SINGULAIR) 10 MG tablet TAKE ONE TABLET DAILY FOR ALLERGY   olmesartan (BENICAR) 40 MG tablet Take  1 tablet  Daily  for BP   predniSONE (DELTASONE) 10 MG tablet 1 tab 3 x day for 2 days, then 1 tab 2 x day for 2 days, then 1 tab 1 x day for 3 days   TURMERIC CURCUMIN PO Take 750 mg by mouth daily.   promethazine-dextromethorphan (PROMETHAZINE-DM) 6.25-15 MG/5ML syrup Take 1 tsp every 4 hours if needed for cough   Current Facility-Administered Medications on File Prior to Visit  Medication   ipratropium-albuterol (DUONEB) 0.5-2.5 (3) MG/3ML nebulizer solution 3 mL    ROS: all negative except what is noted in the HPI.   Physical Exam:  BP 130/68   Pulse 81   Temp (!) 97.5 F (36.4 C)   Ht 5\' 2"  (1.575 m)   Wt 138 lb 3.2 oz (62.7 kg)   SpO2 99%   BMI 25.28 kg/m   General Appearance: NAD.  Awake, conversant and cooperative. Eyes: PERRLA, EOMs intact.  Sclera white.  Conjunctiva without erythema. Sinuses: No frontal/maxillary tenderness.  No nasal discharge. Nares patent.  ENT/Mouth: Ext aud canals clear.  Bilateral TMs w/DOL and without erythema or bulging. Hearing intact.  Posterior pharynx without swelling or exudate.  Tonsils without swelling or erythema.  Neck: Supple.  No masses, nodules or thyromegaly. Respiratory: Effort is regular with non-labored breathing. Breath sounds are equal bilaterally with scattered wheezing throughout all lung fields upon expiration and inspiration.  Cardio: RRR with no MRGs. Brisk peripheral pulses without  edema.  Abdomen: Active BS in all four quadrants.  Soft and non-tender without guarding, rebound tenderness, hernias or masses. Lymphatics: Non tender without lymphadenopathy.  Musculoskeletal: Full ROM, 5/5 strength, normal ambulation.  No clubbing or cyanosis. Skin: Appropriate color for ethnicity. Warm without rashes, lesions, ecchymosis, ulcers.  Neuro: CN II-XII grossly normal. Normal muscle tone without cerebellar symptoms and intact sensation.   Psych: AO X 3,  appropriate mood and affect, insight and judgment.     Adela Glimpse, NP 9:31 AM [facility]

## 2022-09-23 ENCOUNTER — Other Ambulatory Visit: Payer: Self-pay | Admitting: Nurse Practitioner

## 2022-09-23 ENCOUNTER — Encounter: Payer: Self-pay | Admitting: Nurse Practitioner

## 2022-09-23 DIAGNOSIS — J069 Acute upper respiratory infection, unspecified: Secondary | ICD-10-CM

## 2022-09-23 DIAGNOSIS — R051 Acute cough: Secondary | ICD-10-CM

## 2022-09-23 DIAGNOSIS — I10 Essential (primary) hypertension: Secondary | ICD-10-CM

## 2022-09-23 DIAGNOSIS — R062 Wheezing: Secondary | ICD-10-CM

## 2022-09-23 DIAGNOSIS — J4520 Mild intermittent asthma, uncomplicated: Secondary | ICD-10-CM

## 2022-09-23 DIAGNOSIS — R0602 Shortness of breath: Secondary | ICD-10-CM

## 2022-09-23 MED ORDER — DOXYCYCLINE HYCLATE 100 MG PO TABS: 100.0000 mg | ORAL_TABLET | Freq: Two times a day (BID) | ORAL | 0 refills | Status: AC

## 2022-10-03 ENCOUNTER — Ambulatory Visit: Payer: PPO | Admitting: Nurse Practitioner

## 2022-10-17 ENCOUNTER — Ambulatory Visit: Payer: PPO | Admitting: Internal Medicine

## 2022-10-17 ENCOUNTER — Encounter: Payer: Self-pay | Admitting: Internal Medicine

## 2022-10-17 VITALS — BP 136/72 | HR 68 | Temp 97.3°F | Resp 16 | Ht 62.0 in | Wt 142.2 lb

## 2022-10-17 DIAGNOSIS — H6121 Impacted cerumen, right ear: Secondary | ICD-10-CM | POA: Diagnosis not present

## 2022-10-17 NOTE — Progress Notes (Signed)
Future Appointments  Date Time Provider Department  10/17/2022  3:30 PM Lucky Cowboy, MD [facility]  11/24/2022                    6 mo ov 10:30 AM Lucky Cowboy, MD [facility]  03/01/2023                    9 mo ov  9:30 AM Adela Glimpse, NP [facility]  05/31/2023                      cpe 10:00 AM Lucky Cowboy, MD [facility]    History of Present Illness:   This very nice 82 y.o. DWF with  HTN, HLD, Prediabetes  and Vitamin D Deficiency presents with c/o decreased hearing on the Rt.    Current Outpatient Medications on File Prior to Visit  Medication Sig   acetaminophen (TYLENOL) 650 MG CR tablet Take 1,300 mg by mouth every 8 (eight) hours as needed for pain.   albuterol (VENTOLIN HFA) 108 (90 Base) MCG/ACT inhaler Use 2 inhalations  15 minutes Apart  every 4 hours  to Rescue Asthma   Aspirin 81 MG CAPS Take 1 capsule every day by oral route.   b complex vitamins capsule Take 1 capsule by mouth daily.   carboxymethylcellulose (REFRESH TEARS) 0.5 % SOLN Place 1 drop into both eyes 3 (three) times daily as needed (dry eyes).   Cholecalciferol (VITAMIN D) 50 MCG (2000 UT) tablet Take 2,000 Units by mouth daily.   Fluticasone-Umeclidin-Vilant (TRELEGY ELLIPTA) 200-62.5-25 MCG/ACT AEPB Inhale 1 Inhalation into the lungs daily.   MAGNESIUM PO Take 500 mg by mouth daily. Magesium Oxide   montelukast (SINGULAIR) 10 MG tablet TAKE ONE TABLET DAILY FOR ALLERGY   olmesartan (BENICAR) 40 MG tablet TAKE ONE TABLET DAILY FOR BLOOD PRESSURE   TURMERIC CURCUMIN PO Take 750 mg by mouth daily.   predniSONE (DELTASONE) 10 MG tablet 1 tab 3 x day for 2 days, then 1 tab 2 x day for 2 days, then 1 tab 1 x day for 3 days (Patient not taking: Reported on 10/17/2022)   Current Facility-Administered Medications on File Prior to Visit  Medication   ipratropium-albuterol (DUONEB) 0.5-2.5 (3) MG/3ML nebulizer solution 3 mL    Allergies  Allergen Reactions   Atorvastatin     Myalgias     Codeine Nausea And Vomiting and Other (See Comments)    Severe headaches (tolerates Dilaudid)    Fosamax [Alendronate] Other (See Comments)    Severe arthrlalgias   Hydrocodone-Acetaminophen Nausea And Vomiting and Other (See Comments)    Severe headaches (tolerates Dilaudid)   Oxycodone-Acetaminophen Nausea And Vomiting and Other (See Comments)    Severe headaches (tolerates Dilaudid)   Voltaren [Diclofenac] Rash    Gel     Problem list She has Hyperlipidemia; Essential hypertension; Seasonal allergic rhinitis; Vitamin D deficiency; Medication management; Tortuous aorta (HCC); Osteoporosis; Osteoarthritis of right knee; Diverticulosis; Pelvic congestion syndrome; B12 deficiency; History of colon polyps; Asymptomatic varicose veins of left lower extremity; and Osteoarthritis of right hip on their problem list.   Observations/Objective:  BP 136/72   Pulse 68   Temp (!) 97.3 F (36.3 C)   Resp 16   Ht 5\' 2"  (1.575 m)   Wt 142 lb 3.2 oz (64.5 kg)   SpO2 99%   BMI 26.01 kg/m   HEENT -  Left EAC Clear& Lt TM Normal.  Right EAC impacted with  Cerumen.  Procedure  [phone])  After informed consent the Right EAC was irrigated x 4 til waxy detritus was washed away with remnants of a cotton swab. Post procedure Rt TM appeared Normal . Hearing was restored.  N/O/P - Clear.  Neck - supple.  Chest - Clear equal BS. Cor - Nl HS. RRR w/o sig MGR. PP 1(+). No edema. MS- FROM w/o deformities.  Gait Nl. Neuro -  Nl w/o focal abnormalities.   Assessment and Plan:   1. Hearing loss due to cerumen impaction, right  - Irrigated til clear.    Follow Up Instructions:        I discussed the assessment and treatment plan with the patient. The patient was provided an opportunity to ask questions and all were answered. The patient agreed with the plan and demonstrated an understanding of the instructions.       The patient was advised to call back or seek an in-person evaluation if the symptoms  worsen or if the condition fails to improve as anticipated.    Marinus Maw, MD

## 2022-10-17 NOTE — Patient Instructions (Signed)
Earwax Buildup, Adult Your ears make something called earwax. It helps keep germs called bacteria away and protects the skin in your ears. Sometimes, too much earwax can build up. This can cause discomfort or make it harder to hear. What are the causes? Earwax buildup can happen when you have too much earwax in your ears. Earwax is made in the outer part of your ear canal. It's supposed to fall out in small amounts over time. But if your ears aren't able to clean themselves like they should, earwax can build up. What increases the risk? You're more likely to get earwax buildup if: You clean your ears with cotton swabs. You pick at your ears. You use earplugs or in-ear headphones a lot. You wear hearing aids. You may also be more likely to get it if: You're female. You're older. Your ears naturally make more earwax. You have narrow ear canals or extra hair in your ears. Your earwax is too thick or sticky. You have eczema. You're dehydrated. This means there's not enough fluid in your body. What are the signs or symptoms? Symptoms of earwax buildup include: Not being able to hear as well. A feeling of fullness in your ear. Feeling like your ear is plugged. Fluid coming from your ear. Ear pain or an itchy ear. Ringing in your ear. Coughing or problems with balance. How is this diagnosed? Earwax buildup may be diagnosed based on your symptoms, medical history, and an ear exam. During the exam, your health care provider will look into your ear with a tool called an otoscope. You may also have tests, such as a hearing test. How is this treated? Earwax buildup may be treated by: Using ear drops. Having the earwax removed by a provider. The provider may: Flush the ear with water. Use a tool called a curette that has a loop on the end. Use a suction device. Having surgery. This may be done in severe cases. Follow these instructions at home:  Cleaning your ears Clean your ears as told  by your provider. You can clean the outside of your ears with a washcloth or tissue. Do not overclean your ears. Do not put anything into your ear unless told. This includes cotton swabs. General instructions Take over-the-counter and prescription medicines only as told by your provider. Drink enough fluid to keep your pee (urine) pale yellow. This helps thin the earwax. If you have hearing aids, clean them as told. Keep all follow-up visits. If earwax builds up in your ears often or if you use hearing aids, ask your provider how often you should have your ears cleaned. Contact a health care provider if: Your ear pain gets worse. You have a fever. You have pus, blood, or other fluid coming from your ear. You have hearing loss. You have ringing in your ears that won't go away. You feel like the room is spinning. This is called vertigo. Your symptoms don't get better with treatment. This information is not intended to replace advice given to you by your health care provider. Make sure you discuss any questions you have with your health care provider. Document Revised: 05/26/2022 Document Reviewed: 05/26/2022 Elsevier Patient Education  2024 [employer].

## 2022-11-23 ENCOUNTER — Encounter: Payer: Self-pay | Admitting: Internal Medicine

## 2022-11-23 ENCOUNTER — Ambulatory Visit: Payer: PPO | Admitting: Internal Medicine

## 2022-11-23 NOTE — Progress Notes (Unsigned)
Future Appointments  Date Time Provider Department  11/24/2022                    6 mo ov  10:30 AM Lucky Cowboy, MD [facility]  03/01/2023                    9 mo ov  9:30 AM Adela Glimpse, NP [facility]  05/02/2023                      wellness  9:30 AM Adela Glimpse, NP [facility]  05/31/2023                       cpe 10:00 AM Lucky Cowboy, MD [facility]    History of Present Illness:       This very nice 82 y.o. WWF with HTN, HLD, Pre-Diabetes and Vitamin D Deficiency  presents for 6  month follow up .         Patient is treated for HTN  circa 2003 & BP has been controlled at home. Today's BP is at goal - 138/70. Patient has had no complaints of any cardiac type chest pain, palpitations, dyspnea /orthopnea / PND, dizziness, claudication or dependent edema.       Patient is Statin Intolerant & Hyperlipidemia is not controlled with diet .  Last Lipids were not at goal :  Lab Results  Component Value Date   CHOL 223 (H) 08/24/2022   HDL 82 08/24/2022   LDLCALC 106 (H) 08/24/2022   TRIG 232 (H) 08/24/2022   CHOLHDL 2.7 08/24/2022     Also, the patient has history of PreDiabetes and has had no symptoms of reactive hypoglycemia, diabetic polys, paresthesias or visual blurring.  Last A1c was normal & at goal.   Lab Results  Component Value Date   HGBA1C 5.5 05/03/2022                                                         Further, the patient also has history of Vitamin D Deficiency ("37"/2009) and supplements vitamin D . Last vitamin D was near  goal :  Lab Results  Component Value Date   VD25OH 56 08/24/2022        Current Outpatient Medications  Medication Instructions   acetaminophen  1,300 mg, Oral, Every 8 hours PRN   albuterol HFA inhaler Use 2 inhalations  every 4 hours  to Rescue Asthma   Aspirin 81 MG CAPS Take 1 capsule every day    b complex vitamins capsule 1 capsule  Daily   REFRESH TEARS 0.5 % SOLN 1 drop, Both Eyes, 3 times daily PRN    TRELEGY ELLIPTA 200-62.5-25  1 Inhalation  Daily   MAGNESIUM  500 mg  Daily   montelukast 10 MG tablet TAKE ONE TABLET DAILY   olmesartan \\40  MG tablet TAKE ONE TABLET DAILY    TURMERIC CURCUMIN 750 mg Daily   Vitamin D  2,000 Units Daily     Allergies  Allergen Reactions   Atorvastatin Anaphylaxis  Myalgias   Codeine Nausea And Vomiting;  Severe headaches   Alendronate Severe arthrlalgias   Hydrocodone-Acetaminophen Nausea And Vomiting;  Severe headaches   Oxycodone-Acetaminophen Nausea And Vomiting Severe headaches (tolerates Dilaudid)  PMHx:   Past Medical History:  Diagnosis Date   Allergy    Arthritis    Cataract    GERD (gastroesophageal reflux disease)    Hx: UTI (urinary tract infection)    Hyperlipidemia    Hypertension    Prediabetes    Seasonal allergic rhinitis    Vitamin D deficiency      Immunization History  Administered Date(s) Administered   Influenza, High Dose 03/10/2015, 01/10/2017, 02/01/2018, 02/12/2019, 02/13/2020, 01/28/2021   PFIZER SARS-COV-2 Vacc 04/16/2019, 05/07/2019, 04/03/2020   Pneumococcal -13 05/05/2014   Pneumococcal -23 06/28/2006   Td 05/27/2011     Past Surgical History:  Procedure Laterality Date   BACK SURGERY  1999   L5    DILATION AND CURETTAGE OF UTERUS     KNEE ARTHROPLASTY  2010   L knee   TONSILLECTOMY     TOTAL SHOULDER ARTHROPLASTY  02/10/2011   Procedure: TOTAL SHOULDER ARTHROPLASTY;  Surgeon: Vania Rea Supple;  Location: [facility] OR;  Service: Orthopedics;  Laterality: Left;  TOTAL SHOULDER ARTHROPLASTY LEFT SIDE   TOTAL SHOULDER ARTHROPLASTY  12/22/2011   Procedure: TOTAL SHOULDER ARTHROPLASTY;  Surgeon: Senaida Lange, MD;  Location: [facility] OR;  Service: Orthopedics;  Laterality: Right;  right total shoulder arthroplasty    FHx:    Reviewed / unchanged  SHx:    Reviewed / unchanged   Systems Review:  Constitutional: Denies fever, chills, wt changes, headaches, insomnia, fatigue, night sweats, change in  appetite. Eyes: Denies redness, blurred vision, diplopia, discharge, itchy, watery eyes.  ENT: Denies discharge, congestion, post nasal drip, epistaxis, sore throat, earache, hearing loss, dental pain, tinnitus, vertigo, sinus pain, snoring.  CV: Denies chest pain, palpitations, irregular heartbeat, syncope, dyspnea, diaphoresis, orthopnea, PND, claudication or edema. Respiratory: denies cough, dyspnea, DOE, pleurisy, hoarseness, laryngitis, wheezing.  Gastrointestinal: Denies dysphagia, odynophagia, heartburn, reflux, water brash, abdominal pain or cramps, nausea, vomiting, bloating, diarrhea, constipation, hematemesis, melena, hematochezia  or hemorrhoids. Genitourinary: Denies dysuria, frequency, urgency, nocturia, hesitancy, discharge, hematuria or flank pain. Musculoskeletal: Denies arthralgias, myalgias, stiffness, jt. swelling, pain, limping or strain/sprain.  Skin: Denies pruritus, rash, hives, warts, acne, eczema or change in skin lesion(s). Neuro: No weakness, tremor, incoordination, spasms, paresthesia or pain. Psychiatric: Denies confusion, memory loss or sensory loss. Endo: Denies change in weight, skin or hair change.  Heme/Lymph: No excessive bleeding, bruising or enlarged lymph nodes.  Physical Exam  BP 138/70   Pulse 90   Temp 97.9 F (36.6 C)   Resp 16   Ht 5\' 2"  (1.575 m)   Wt 139 lb (63 kg)   SpO2 99%   BMI 25.42 kg/m   Appears  well nourished, well groomed  and in no distress.  Eyes: PERRLA, EOMs, conjunctiva no swelling or erythema. Sinuses: No frontal/maxillary tenderness ENT/Mouth: EAC's clear, TM's nl w/o erythema, bulging. Nares clear w/o erythema, swelling, exudates. Oropharynx clear without erythema or exudates. Oral hygiene is good. Tongue normal, non obstructing. Hearing intact.  Neck: Supple. Thyroid not palpable. Car 2+/2+ without bruits, nodes or JVD. Chest: Respirations nl with BS clear & equal w/o rales, rhonchi, wheezing or stridor.  Cor: Heart  sounds normal w/ regular rate and rhythm without sig. murmurs, gallops, clicks or rubs. Peripheral pulses normal and equal  without edema.  Abdomen: Soft & bowel sounds normal. Non-tender w/o guarding, rebound, hernias, masses or organomegaly.  Lymphatics: Unremarkable.  Musculoskeletal: Full ROM all peripheral extremities, joint stability, 5/5 strength and normal gait.  Skin: Warm, dry without exposed rashes, lesions  or ecchymosis apparent.  Neuro: Cranial nerves intact, reflexes equal bilaterally. Sensory-motor testing grossly intact. Tendon reflexes grossly intact.  Pysch: Alert & oriented x 3.  Insight and judgement nl & appropriate. No ideations.  Assessment and Plan:  1. Essential hypertension  - Continue medication, monitor blood pressure at home.  - Continue DASH diet.  Reminder to go to the ER if any CP,  SOB, nausea, dizziness, severe HA, changes vision/speech.   - CBC with Differential/Platelet - COMPLETE METABOLIC PANEL WITH GFR - Magnesium - TSH  2. Hyperlipidemia, mixed  - Continue diet/meds, exercise,& lifestyle modifications.  - Continue monitor periodic cholesterol/liver & renal functions    - Lipid panel - TSH  3. Abnormal glucose  - Continue diet, exercise  - Lifestyle modifications.  - Monitor appropriate labs    - Hemoglobin A1c - Insulin, random  4. Vitamin D deficiency  - Continue supplementation    - VITAMIN D 25 Hydroxy   5. Medication management  - CBC with Differential/Platelet - COMPLETE METABOLIC PANEL WITH GFR - Magnesium - Lipid panel - TSH - Hemoglobin A1c - Insulin, random - VITAMIN D 25 Hydroxy          Discussed  regular exercise, BP monitoring, weight control to achieve/maintain BMI less than 25 and discussed med and SE's. Recommended labs to assess /monitor clinical status .  I discussed the assessment and treatment plan with the patient. The patient was provided an opportunity to ask questions and all were answered. The  patient agreed with the plan and demonstrated an understanding of the instructions.  I provided over 30 minutes of exam, counseling, chart review and  complex critical decision making.        The patient was advised to call back or seek an in-person evaluation if the symptoms worsen or if the condition fails to improve as anticipated.   Marinus Maw, MD

## 2022-11-23 NOTE — Patient Instructions (Signed)

## 2022-11-24 ENCOUNTER — Ambulatory Visit (INDEPENDENT_AMBULATORY_CARE_PROVIDER_SITE_OTHER): Payer: PPO | Admitting: Internal Medicine

## 2022-11-24 ENCOUNTER — Encounter: Payer: Self-pay | Admitting: Internal Medicine

## 2022-11-24 VITALS — BP 138/70 | HR 90 | Temp 97.9°F | Resp 16 | Ht 62.0 in | Wt 139.0 lb

## 2022-11-24 DIAGNOSIS — I10 Essential (primary) hypertension: Secondary | ICD-10-CM | POA: Diagnosis not present

## 2022-11-24 DIAGNOSIS — E782 Mixed hyperlipidemia: Secondary | ICD-10-CM | POA: Diagnosis not present

## 2022-11-24 DIAGNOSIS — Z79899 Other long term (current) drug therapy: Secondary | ICD-10-CM | POA: Diagnosis not present

## 2022-11-24 DIAGNOSIS — R7309 Other abnormal glucose: Secondary | ICD-10-CM | POA: Diagnosis not present

## 2022-11-24 DIAGNOSIS — E559 Vitamin D deficiency, unspecified: Secondary | ICD-10-CM

## 2022-11-25 LAB — COMPLETE METABOLIC PANEL WITH GFR
AG Ratio: 1.7 (calc) (ref 1.0–2.5)
ALT: 31 U/L — ABNORMAL HIGH (ref 6–29)
AST: 27 U/L (ref 10–35)
Albumin: 4.8 g/dL (ref 3.6–5.1)
Alkaline phosphatase (APISO): 59 U/L (ref 37–153)
BUN/Creatinine Ratio: 17 (calc) (ref 6–22)
BUN: 18 mg/dL (ref 7–25)
CO2: 24 mmol/L (ref 20–32)
Calcium: 10.7 mg/dL — ABNORMAL HIGH (ref 8.6–10.4)
Chloride: 97 mmol/L — ABNORMAL LOW (ref 98–110)
Creat: 1.08 mg/dL — ABNORMAL HIGH (ref 0.60–0.95)
Globulin: 2.8 g/dL (ref 1.9–3.7)
Glucose, Bld: 100 mg/dL — ABNORMAL HIGH (ref 65–99)
Potassium: 5.4 mmol/L — ABNORMAL HIGH (ref 3.5–5.3)
Sodium: 132 mmol/L — ABNORMAL LOW (ref 135–146)
Total Bilirubin: 0.6 mg/dL (ref 0.2–1.2)
Total Protein: 7.6 g/dL (ref 6.1–8.1)
eGFR: 52 mL/min/{1.73_m2} — ABNORMAL LOW (ref >=60)

## 2022-11-25 LAB — LIPID PANEL
Cholesterol: 254 mg/dL — ABNORMAL HIGH (ref <200)
HDL: 107 mg/dL (ref >=50)
LDL Cholesterol (Calc): 125 mg/dL — ABNORMAL HIGH
Non-HDL Cholesterol (Calc): 147 mg/dL — ABNORMAL HIGH (ref <130)
Total CHOL/HDL Ratio: 2.4 (calc) (ref <5.0)
Triglycerides: 116 mg/dL (ref <150)

## 2022-11-25 LAB — CBC WITH DIFFERENTIAL/PLATELET
Absolute Monocytes: 778 {cells}/uL (ref 200–950)
Basophils Absolute: 86 {cells}/uL (ref 0–200)
Basophils Relative: 1.2 %
Eosinophils Absolute: 382 {cells}/uL (ref 15–500)
Eosinophils Relative: 5.3 %
HCT: 36.1 % (ref 35.0–45.0)
Hemoglobin: 12.2 g/dL (ref 11.7–15.5)
Lymphs Abs: 1894 {cells}/uL (ref 850–3900)
MCH: 31.7 pg (ref 27.0–33.0)
MCHC: 33.8 g/dL (ref 32.0–36.0)
MCV: 93.8 fL (ref 80.0–100.0)
MPV: 8.5 fL (ref 7.5–12.5)
Monocytes Relative: 10.8 %
Neutro Abs: 4061 {cells}/uL (ref 1500–7800)
Neutrophils Relative %: 56.4 %
Platelets: 335 10*3/uL (ref 140–400)
RBC: 3.85 10*6/uL (ref 3.80–5.10)
RDW: 12 % (ref 11.0–15.0)
Total Lymphocyte: 26.3 %
WBC: 7.2 10*3/uL (ref 3.8–10.8)

## 2022-11-25 LAB — VITAMIN D 25 HYDROXY (VIT D DEFICIENCY, FRACTURES): Vit D, 25-Hydroxy: 53 ng/mL (ref 30–100)

## 2022-11-25 LAB — HEMOGLOBIN A1C
Hgb A1c MFr Bld: 5.3 %{Hb} (ref <5.7)
Mean Plasma Glucose: 105 mg/dL
eAG (mmol/L): 5.8 mmol/L

## 2022-11-25 LAB — TSH: TSH: 1.57 m[IU]/L (ref 0.40–4.50)

## 2022-11-25 LAB — INSULIN, RANDOM: Insulin: 13.5 u[IU]/mL

## 2022-11-25 LAB — MAGNESIUM: Magnesium: 2.1 mg/dL (ref 1.5–2.5)

## 2022-11-26 ENCOUNTER — Other Ambulatory Visit: Payer: Self-pay | Admitting: Internal Medicine

## 2022-11-26 DIAGNOSIS — E782 Mixed hyperlipidemia: Secondary | ICD-10-CM

## 2022-11-26 NOTE — Progress Notes (Signed)
<>*<>*<>*<>*<>*<>*<>*<>*<>*<>*<>*<>*<>*<>*<>*<>*<>*<>*<>*<>*<>*<>*<>*<>*<> <>*<>*<>*<>*<>*<>*<>*<>*<>*<>*<>*<>*<>*<>*<>*<>*<>*<>*<>*<>*<>*<>*<>*<>*<>  -Test results slightly outside the reference range are not unusual. If there is anything important, I will review this with you,  otherwise it is considered normal test values.  If you have further questions,  please do not hesitate to contact me at the office or via My Chart.   <>*<>*<>*<>*<>*<>*<>*<>*<>*<>*<>*<>*<>*<>*<>*<>*<>*<>*<>*<>*<>*<>*<>*<>*<>  -   Potassium ( K= 5.4) is elevated  , So  Avoid - Foods high in Potassium are Lima beans/Kidney beans, prunes,   Raisins, Avocados, Watermelon, Sunflower seeds/kernals, cooked spinach,  acorn squash, crushed tomatoes, Mushrooms, cooked Salmon and   of course Orange Juice (Avoid Bananas)   - Also AVOID   Salt Substitutes  as " Lite Salt "   &  " No Salt  "  which are   100 % pure KCL - Potassium Chloride !   <>*<>*<>*<>*<>*<>*<>*<>*<>*<>*<>*<>*<>*<>*<>*<>*<>*<>*<>*<>*<>*<>*<>*<>*<>  -   - Total Chol =     254    is very high risk for Heart Attack /Stroke /Vascular Dementia     ( Ideal or Goal is less than 180 ! )  & - Bad /Dangerous LDL Chol =  125  - - >> Sitting on a time Bomb !     ( Ideal or Goal is less than 70 ! )    - The cause is Bad Diet !  -  Since can't take "Statin " medications - will make referral to See Dr [facility]   - Read or listen to   Dr Gerri Spore 's book    " How Not to Die ! "    - Recommend a stricter plant based low cholesterol diet   - Cholesterol only comes from animal sources                                                                                   - ie. meat, dairy, egg yolks  - Eat all the vegetables you want.                           - Avoid Meat, Avoid Meat , Avoid Meat  ! ! !                                                                   -especially red meat - Beef AND Pork  -  Avoid cheese & dairy - milk & ice cream.                                   - Cheese is the most concentrated form of trans-fats which  is the worst thing to clog up our arteries.   - Veggie cheese is OK which can be found in                                              the fresh produce section at                                                          [employer] or [employer] or [employer]  <>*<>*<>*<>*<>*<>*<>*<>*<>*<>*<>*<>*<>*<>*<>*<>*<>*<>*<>*<>*<>*<>*<>*<>*<>  -  A1c - Norma;l - No Diabetes  - Great !   <>*<>*<>*<>*<>*<>*<>*<>*<>*<>*<>*<>*<>*<>*<>*<>*<>*<>*<>*<>*<>*<>*<>*<>*<>  -  Vitamin D = 53   is low   - Vitamin D goal is between 70-100.   - Please make sure that you are taking your Vitamin D as directed.   - It is very important as a natural anti-inflammatory and helping the                           immune system protect against viral infections, like the Covid-19    helping hair, skin, and nails, as well as reducing stroke and heart attack risk.   - It helps your bones and helps with mood.  - It also decreases numerous cancer risks so please                                                                                           take it as directed.   - Low Vit D is associated with a 200-300% higher risk for CANCER   and 200-300% higher risk for HEART   ATTACK  &  STROKE.    - It is also associated with higher death rate at younger ages,   autoimmune diseases like Rheumatoid arthritis, Lupus, Multiple Sclerosis.     - Also many other serious conditions, like depression, Alzheimer's  Dementia,  muscle aches, fatigue, fibromyalgia   <>*<>*<>*<>*<>*<>*<>*<>*<>*<>*<>*<>*<>*<>*<>*<>*<>*<>*<>*<>*<>*<>*<>*<>*<>  -  All Else - CBC - Kidneys - Electrolytes - Liver - Magnesium & Thyroid    - all  Normal /  OK  <>*<>*<>*<>*<>*<>*<>*<>*<>*<>*<>*<>*<>*<>*<>*<>*<>*<>*<>*<>*<>*<>*<>*<>*<> <>*<>*<>*<>*<>*<>*<>*<>*<>*<>*<>*<>*<>*<>*<>*<>*<>*<>*<>*<>*<>*<>*<>*<>*<>

## 2023-02-28 DIAGNOSIS — M1711 Unilateral primary osteoarthritis, right knee: Secondary | ICD-10-CM | POA: Diagnosis not present

## 2023-03-01 ENCOUNTER — Other Ambulatory Visit: Payer: Self-pay | Admitting: Nurse Practitioner

## 2023-03-01 ENCOUNTER — Ambulatory Visit: Payer: PPO | Admitting: Nurse Practitioner

## 2023-03-01 DIAGNOSIS — J4520 Mild intermittent asthma, uncomplicated: Secondary | ICD-10-CM

## 2023-03-03 DIAGNOSIS — S0502XA Injury of conjunctiva and corneal abrasion without foreign body, left eye, initial encounter: Secondary | ICD-10-CM | POA: Diagnosis not present

## 2023-03-06 DIAGNOSIS — S0502XA Injury of conjunctiva and corneal abrasion without foreign body, left eye, initial encounter: Secondary | ICD-10-CM | POA: Diagnosis not present

## 2023-03-07 DIAGNOSIS — M1711 Unilateral primary osteoarthritis, right knee: Secondary | ICD-10-CM | POA: Diagnosis not present

## 2023-03-09 ENCOUNTER — Encounter: Payer: Self-pay | Admitting: Nurse Practitioner

## 2023-03-09 ENCOUNTER — Ambulatory Visit (INDEPENDENT_AMBULATORY_CARE_PROVIDER_SITE_OTHER): Payer: PPO | Admitting: Nurse Practitioner

## 2023-03-09 VITALS — BP 136/76 | HR 63 | Temp 97.4°F | Ht 62.0 in | Wt 141.2 lb

## 2023-03-09 DIAGNOSIS — I771 Stricture of artery: Secondary | ICD-10-CM | POA: Diagnosis not present

## 2023-03-09 DIAGNOSIS — J302 Other seasonal allergic rhinitis: Secondary | ICD-10-CM

## 2023-03-09 DIAGNOSIS — N9489 Other specified conditions associated with female genital organs and menstrual cycle: Secondary | ICD-10-CM | POA: Diagnosis not present

## 2023-03-09 DIAGNOSIS — E871 Hypo-osmolality and hyponatremia: Secondary | ICD-10-CM

## 2023-03-09 DIAGNOSIS — I8392 Asymptomatic varicose veins of left lower extremity: Secondary | ICD-10-CM | POA: Diagnosis not present

## 2023-03-09 DIAGNOSIS — I10 Essential (primary) hypertension: Secondary | ICD-10-CM | POA: Diagnosis not present

## 2023-03-09 DIAGNOSIS — D649 Anemia, unspecified: Secondary | ICD-10-CM | POA: Diagnosis not present

## 2023-03-09 DIAGNOSIS — Z79899 Other long term (current) drug therapy: Secondary | ICD-10-CM

## 2023-03-09 DIAGNOSIS — E559 Vitamin D deficiency, unspecified: Secondary | ICD-10-CM

## 2023-03-09 DIAGNOSIS — Z860100 Personal history of colon polyps, unspecified: Secondary | ICD-10-CM

## 2023-03-09 DIAGNOSIS — Z23 Encounter for immunization: Secondary | ICD-10-CM | POA: Diagnosis not present

## 2023-03-09 DIAGNOSIS — M1711 Unilateral primary osteoarthritis, right knee: Secondary | ICD-10-CM | POA: Diagnosis not present

## 2023-03-09 DIAGNOSIS — E785 Hyperlipidemia, unspecified: Secondary | ICD-10-CM | POA: Diagnosis not present

## 2023-03-09 DIAGNOSIS — M810 Age-related osteoporosis without current pathological fracture: Secondary | ICD-10-CM | POA: Diagnosis not present

## 2023-03-09 NOTE — Progress Notes (Signed)
Follow Up  Assessment:   Essential hypertension Discussed DASH (Dietary Approaches to Stop Hypertension) DASH diet is lower in sodium than a typical American diet. Cut back on foods that are high in saturated fat, cholesterol, and trans fats. Eat more whole-grain foods, fish, poultry, and nuts Remain active and exercise as tolerated daily.  Monitor BP at home-Call if greater than 130/80.  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   Hyperlipidemia, unspecified hyperlipidemia type Unable to tolearate atorvastatin, zetia Discussed lifestyle modifications. Recommended diet heavy in fruits and veggies, omega 3's. Decrease consumption of animal meats, cheeses, and dairy products. Remain active and exercise as tolerated. Continue to monitor. Check lipids/TSH  Medication management All medications discussed and reviewed in full. All questions and concerns regarding medications addressed.    Vitamin D deficiency Continue supplement for goal of 60-100 Monitor Vitamin D levels  Seasonal allergic rhinitis, unspecified trigger Continue meds, singulair and PRN zyrtec  Avoid triggers  Pelvic congestion syndrome Incidentally noted on imaging; denies pelvic or vaginal sx; monitor   Osteoporosis without current pathological fracture, unspecified osteoporosis type Pursue a combination of weight-bearing exercises and strength training. Patients with severe mobility impairment should be referred for physical therapy. Advised on fall prevention measures including proper lighting in all rooms, removal of area rugs and floor clutter, use of walking devices as deemed appropriate, avoidance of uneven walking surfaces. Smoking cessation and moderate alcohol consumption if applicable Consume 800 to 1000 IU of vitamin D daily with a goal vitamin D serum value of 30 ng/mL or higher. Aim for 1000 to 1200 mg of elemental  calcium daily through supplements and/or dietary sources.  Osteoarthritis, R knee Stable Ortho following RICE when flared Continue to monitor  Varicose vein, asymptomatic Compression hose encouraged; monitor  History of colon polyps 5 year recall due 08/2021 was optional per Dr. Jarold Motto Extended discussion today of risks and benefits Patient would like to proceed with referral back due to excellent life expectancy High fiber diet, low red/processed meat encouraged Refer to GI  Low Hemoglobin Monitor CBC and Iron, TIBC, Ferritin  Chronic hyponatremia Monitor Na Discussed ways to incorporate salt into diet.    Flu vaccine need Administered - patient tolerated well without complication.  Orders Placed This Encounter  Procedures   Flu vaccine HIGH DOSE PF(Fluzone Trivalent)   CBC with Differential/Platelet   COMPLETE METABOLIC PANEL WITH GFR   Lipid panel    Notify office for further evaluation and treatment, questions or concerns if any reported s/s fail to improve.   The patient was advised to call back or seek an in-person evaluation if any symptoms worsen or if the condition fails to improve as anticipated.   Further disposition pending results of labs. Discussed med's effects and SE's.    I discussed the assessment and treatment plan with the patient. The patient was provided an opportunity to ask questions and all were answered. The patient agreed with the plan and demonstrated an understanding of the instructions.  Discussed med's effects and SE's. Screening labs and tests as requested with regular follow-up as recommended.  I provided 30 minutes of face-to-face time during this encounter including counseling, chart review, and critical decision making was preformed.  Today's Plan of Care is based on a patient-centered health care approach known as shared decision making - the decisions, tests and treatments allow for patient preferences and values to be balanced  with clinical evidence.    Future  Appointments  Date Time Provider Department Center  05/02/2023  9:30 AM Adela Glimpse, NP [facility] None  05/29/2023  9:45 AM Rennis Golden Lisette Abu, MD [facility]  05/31/2023 10:00 AM Lucky Cowboy, MD [facility] None     Subjective:  Michaela Kelley is a 82 y.o. female who presents for a follow up. She has Hyperlipidemia; Essential hypertension; Seasonal allergic rhinitis; Vitamin D deficiency; Medication management; Tortuous aorta (HCC); Osteoporosis; Osteoarthritis of right knee; Diverticulosis; Pelvic congestion syndrome; B12 deficiency; History of colon polyps; Asymptomatic varicose veins of left lower extremity; and Osteoarthritis of right hip on their problem list.  Overall she reports feeling well.  She has no new concerns at this time.  She had abdominal pain, CT abd 01/15/2020 showed focal diverticulitis involving the distal sigmoid colon, she responded well to abx. Also incidentally noted enlarged left-sided periuterine veins are noted within large left ovarian vein, consistent with pelvic congestion syndrome, continues to deny notable vaginal or pelvic sx.   She has orthotic for left foot pain, seeing Dr. Charlsie Merles, doing well recently.  Follows with [facility], Dr. Penni Bombard for knee injections.  Will take celebrex occasionally, will do topical CBD. Also getting R hip injections, which she reports has helped.   BMI is Body mass index is 25.83 kg/m., she has been working on diet and exercise.  Wt Readings from Last 3 Encounters:  03/09/23 141 lb 3.2 oz (64 kg)  11/24/22 139 lb (63 kg)  10/17/22 142 lb 3.2 oz (64.5 kg)   She has broken blood vessels bilateral legs, worse left leg, no pain, wearing compression.    Her blood pressure has been controlled at home (labile, ranges 110s/70s-150s/80s) today their BP is BP: 136/76. She does workout, walking, stretching, water aerobics and golfing.  She denies chest pain, shortness of breath, dizziness.  She is  not on cholesterol medication, on omega 3 supplement, she is off lipitor due to myalgias, zetia was started but patient decided to stop d/t SE. Her cholesterol is not at goal. The cholesterol last visit was:   Lab Results  Component Value Date   CHOL 254 (H) 11/24/2022   HDL 107 11/24/2022   LDLCALC 125 (H) 11/24/2022   TRIG 116 11/24/2022   CHOLHDL 2.4 11/24/2022    She has been working on diet and exercise for glucose management, and denies paresthesia of the feet, polydipsia, polyuria and visual disturbances. Last A1C in the office was:  Lab Results  Component Value Date   HGBA1C 5.3 11/24/2022    She has stable CKD II monitored at this office:  Lab Results  Component Value Date   EGFR 52 (L) 11/24/2022   Patient is on Vitamin D supplement taking 5000 IU daily    Lab Results  Component Value Date   VD25OH 53 11/24/2022   She did start on SL B12 supplement  Lab Results  Component Value Date   VITAMINB12 308 10/01/2021     Medication Review: Current Outpatient Medications on File Prior to Visit  Medication Sig Dispense Refill   acetaminophen (TYLENOL) 650 MG CR tablet Take 1,300 mg by mouth every 8 (eight) hours as needed for pain.     albuterol (VENTOLIN HFA) 108 (90 Base) MCG/ACT inhaler Use 2 inhalations  15 minutes Apart  every 4 hours  to Rescue Asthma 48 g 3   Aspirin 81 MG CAPS Take 1 capsule every day by oral route.     b complex vitamins capsule Take 1 capsule by mouth daily.  carboxymethylcellulose (REFRESH TEARS) 0.5 % SOLN Place 1 drop into both eyes 3 (three) times daily as needed (dry eyes).     Cholecalciferol (VITAMIN D) 50 MCG (2000 UT) tablet Take 2,000 Units by mouth daily.     MAGNESIUM PO Take 500 mg by mouth daily. Magesium Oxide     montelukast (SINGULAIR) 10 MG tablet TAKE ONE TABLET DAILY FOR ALLERGY 90 tablet 3   olmesartan (BENICAR) 40 MG tablet TAKE ONE TABLET DAILY FOR BLOOD PRESSURE 90 tablet 1   TRELEGY ELLIPTA 200-62.5-25 MCG/ACT AEPB  INHALE ONE INHALATION INTO LUNGS DAILY 60 each 2   TURMERIC CURCUMIN PO Take 750 mg by mouth daily.     No current facility-administered medications on file prior to visit.    Current Problems (verified) Patient Active Problem List   Diagnosis Date Noted   Osteoarthritis of right hip 01/17/2022   History of colon polyps 10/01/2021   Asymptomatic varicose veins of left lower extremity 10/01/2021   B12 deficiency 02/12/2020   Diverticulosis 01/15/2020   Pelvic congestion syndrome 01/15/2020   Osteoarthritis of right knee 07/03/2019   Osteoporosis 02/01/2018   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   Medication management 10/03/2013   Seasonal allergic rhinitis    Vitamin D deficiency    Hyperlipidemia 01/02/2008   Essential hypertension 01/02/2008    Screening Tests Immunization History  Administered Date(s) Administered   Influenza, High Dose Seasonal PF 03/10/2015, 01/10/2017, 02/01/2018, 02/12/2019, 02/13/2020, 01/28/2021, 03/23/2022, 03/09/2023   PFIZER(Purple Top)SARS-COV-2 Vaccination 04/16/2019, 05/07/2019, 04/03/2020   Pneumococcal Conjugate-13 05/05/2014   Pneumococcal Polysaccharide-23 06/28/2006   Td 05/27/2011   Health Maintenance  Topic Date Due   Zoster Vaccines- Shingrix (1 of 2) Never done   DTaP/Tdap/Td (2 - Tdap) 05/26/2021   COVID-19 Vaccine (4 - 2024-25 season) 11/27/2022   Medicare Annual Wellness (AWV)  08/24/2023   Pneumonia Vaccine 64+ Years old  Completed   INFLUENZA VACCINE  Completed   DEXA SCAN  Completed   HPV VACCINES  Aged Out   Patient Care Team: Lucky Cowboy, MD as PCP - General (Internal Medicine) Charlett Nose, [facility] (Inactive) as Pharmacist (Pharmacist)  Allergies Allergies  Allergen Reactions   Atorvastatin     Myalgias    Codeine Nausea And Vomiting and Other (See Comments)    Severe headaches (tolerates Dilaudid)    Fosamax [Alendronate] Other (See Comments)    Severe arthrlalgias   Hydrocodone-Acetaminophen Nausea And  Vomiting and Other (See Comments)    Severe headaches (tolerates Dilaudid)   Oxycodone-Acetaminophen Nausea And Vomiting and Other (See Comments)    Severe headaches (tolerates Dilaudid)   Voltaren [Diclofenac] Rash    Gel    SURGICAL HISTORY She  has a past surgical history that includes Knee Arthroplasty (2010); Dilation and curettage of uterus; Tonsillectomy; Back surgery (1999); Total shoulder arthroplasty (02/10/2011); Total shoulder arthroplasty (12/22/2011); and Total hip arthroplasty (Right, 01/17/2022). FAMILY HISTORY Her family history includes Breast cancer in her paternal grandmother; Breast cancer (age of onset: 60) in her mother; Diabetes in her paternal grandfather; Esophageal cancer in her brother; Heart disease in her brother, maternal grandfather, and maternal grandmother; Hypertension in her father. SOCIAL HISTORY She  reports that she quit smoking about 31 years ago. Her smoking use included cigarettes. She started smoking about 51 years ago. She has a 5 pack-year smoking history. She has never used smokeless tobacco. She reports current alcohol use of about 8.0 standard drinks of alcohol per week. She reports that she does not use drugs.  Review  of Systems  Constitutional:  Negative for malaise/fatigue and weight loss.  HENT:  Negative for hearing loss and tinnitus.   Eyes:  Negative for blurred vision and double vision.  Respiratory:  Negative for cough, sputum production, shortness of breath and wheezing.   Cardiovascular:  Negative for chest pain, palpitations, orthopnea, claudication, leg swelling and PND.  Gastrointestinal:  Negative for abdominal pain, blood in stool, constipation, diarrhea, heartburn, melena, nausea and vomiting.  Genitourinary: Negative.   Musculoskeletal:  Positive for joint pain. Negative for falls and myalgias. Back pain: R knee, R hip, ortho follows for injections. Skin:  Negative for rash.  Neurological:  Negative for dizziness, tingling,  sensory change, weakness and headaches.  Endo/Heme/Allergies:  Negative for polydipsia.  Psychiatric/Behavioral: Negative.  Negative for depression, memory loss, substance abuse and suicidal ideas. The patient is not nervous/anxious and does not have insomnia.   All other systems reviewed and are negative.    Objective:     Blood pressure 136/76, pulse 63, temperature (!) 97.4 F (36.3 C), height 5\' 2"  (1.575 m), weight 141 lb 3.2 oz (64 kg), SpO2 98%. Body mass index is 25.83 kg/m.  General appearance: alert, no distress, WD/WN, female HEENT: normocephalic, sclerae anicteric, TMs pearly, nares patent, no discharge or erythema, pharynx normal Oral cavity: MMM, no lesions Neck: supple, no lymphadenopathy, no thyromegaly, no masses Heart: RRR, normal S1, S2, no murmurs Lungs: CTA bilaterally, no wheezes, rhonchi, or rales Abdomen: +bs, soft, non tender, non distended, no masses, no hepatomegaly, no splenomegaly Musculoskeletal: nontender, no obvious deformity, non-antalgic gait. Very mild R knee effusion without heat or erythema.  Extremities: no edema, no cyanosis, no clubbing. Left leg with non-tender enlarged varicose vein to medial thigh Pulses: 2+ symmetric, upper and lower extremities, normal cap refill Neurological: alert, oriented x 3, CN2-12 intact, strength normal upper extremities and lower extremities, sensation normal throughout, DTRs 2+ throughout, no cerebellar signs, gait normal Psychiatric: normal affect, behavior normal, pleasant  Skin: warm, dry, intact; no lesions or rashes   Jamia Hoban, NP   03/09/2023

## 2023-03-09 NOTE — Patient Instructions (Signed)

## 2023-03-10 LAB — COMPLETE METABOLIC PANEL WITH GFR
AG Ratio: 1.7 (calc) (ref 1.0–2.5)
ALT: 26 U/L (ref 6–29)
AST: 20 U/L (ref 10–35)
Albumin: 4.4 g/dL (ref 3.6–5.1)
Alkaline phosphatase (APISO): 56 U/L (ref 37–153)
BUN/Creatinine Ratio: 20 (calc) (ref 6–22)
BUN: 20 mg/dL (ref 7–25)
CO2: 28 mmol/L (ref 20–32)
Calcium: 10.1 mg/dL (ref 8.6–10.4)
Chloride: 95 mmol/L — ABNORMAL LOW (ref 98–110)
Creat: 0.99 mg/dL — ABNORMAL HIGH (ref 0.60–0.95)
Globulin: 2.6 g/dL (ref 1.9–3.7)
Glucose, Bld: 86 mg/dL (ref 65–99)
Potassium: 4.9 mmol/L (ref 3.5–5.3)
Sodium: 130 mmol/L — ABNORMAL LOW (ref 135–146)
Total Bilirubin: 0.8 mg/dL (ref 0.2–1.2)
Total Protein: 7 g/dL (ref 6.1–8.1)
eGFR: 57 mL/min/{1.73_m2} — ABNORMAL LOW (ref >=60)

## 2023-03-10 LAB — LIPID PANEL
Cholesterol: 242 mg/dL — ABNORMAL HIGH (ref <200)
HDL: 108 mg/dL (ref >=50)
LDL Cholesterol (Calc): 117 mg/dL — ABNORMAL HIGH
Non-HDL Cholesterol (Calc): 134 mg/dL — ABNORMAL HIGH (ref <130)
Total CHOL/HDL Ratio: 2.2 (calc) (ref <5.0)
Triglycerides: 74 mg/dL (ref <150)

## 2023-03-10 LAB — CBC WITH DIFFERENTIAL/PLATELET
Absolute Lymphocytes: 3223 {cells}/uL (ref 850–3900)
Absolute Monocytes: 909 {cells}/uL (ref 200–950)
Basophils Absolute: 40 {cells}/uL (ref 0–200)
Basophils Relative: 0.5 %
Eosinophils Absolute: 103 {cells}/uL (ref 15–500)
Eosinophils Relative: 1.3 %
HCT: 34.9 % — ABNORMAL LOW (ref 35.0–45.0)
Hemoglobin: 11.4 g/dL — ABNORMAL LOW (ref 11.7–15.5)
MCH: 30.7 pg (ref 27.0–33.0)
MCHC: 32.7 g/dL (ref 32.0–36.0)
MCV: 94.1 fL (ref 80.0–100.0)
MPV: 8.3 fL (ref 7.5–12.5)
Monocytes Relative: 11.5 %
Neutro Abs: 3626 {cells}/uL (ref 1500–7800)
Neutrophils Relative %: 45.9 %
Platelets: 405 10*3/uL — ABNORMAL HIGH (ref 140–400)
RBC: 3.71 10*6/uL — ABNORMAL LOW (ref 3.80–5.10)
RDW: 11.8 % (ref 11.0–15.0)
Total Lymphocyte: 40.8 %
WBC: 7.9 10*3/uL (ref 3.8–10.8)

## 2023-03-14 DIAGNOSIS — M1711 Unilateral primary osteoarthritis, right knee: Secondary | ICD-10-CM | POA: Diagnosis not present

## 2023-03-23 ENCOUNTER — Other Ambulatory Visit: Payer: Self-pay | Admitting: Nurse Practitioner

## 2023-03-23 DIAGNOSIS — I10 Essential (primary) hypertension: Secondary | ICD-10-CM

## 2023-04-13 NOTE — Progress Notes (Signed)
Assessment and Plan: Michaela Kelley was seen today for acute visit.  Diagnoses and all orders for this visit:  Essential hypertension - continue medications- olmesartan 40 mg every day  - Continue DASH diet, exercise and monitor at home. Call if greater than 130/80.   Screening for colorectal cancer -     POC Hemoccult Bld/Stl (3-Cd Home Screen)- ALL negative  Muscle spasms of neck Use baclofen at bedtime Use hemp oil to neck as needed Heat/ice to neck as needed If symptoms do not improve notify the office and will refer to orthopedics Has follow up appointment with Adela Glimpse NP on 05/02/23 -     Baclofen 5 MG TABS; 1 tab at bedtime as needed for muscle spasm     Further disposition pending results of labs. Discussed med's effects and SE's.   Over 30 minutes of exam, counseling, chart review, and critical decision making was performed.   Future Appointments  Date Time Provider Department Center  05/02/2023  9:30 AM Adela Glimpse, NP [facility] None  05/29/2023  9:45 AM Rennis Golden, Lisette Abu, MD [facility]  06/22/2023 10:00 AM Lucky Cowboy, MD [facility] None    ------------------------------------------------------------------------------------------------------------------   HPI BP 136/66   Pulse 71   Temp (!) 97.5 F (36.4 C)   Ht 5\' 2"  (1.575 m)   Wt 140 lb 3.2 oz (63.6 kg)   SpO2 97%   BMI 25.64 kg/m   83 y.o.female presents for numbness and tingling in her left hand, mostly 2nd and 3rd finger. Finds it is worse with sleeping. Several years ago she had a pinched nerve in her neck.   BP well controlled with olmesartan 40 mg every day  BP Readings from Last 3 Encounters:  04/14/23 136/66  03/09/23 136/76  11/24/22 138/70  Denies headaches, chest pain, shortness of breath and dizziness   BMI is Body mass index is 25.64 kg/m., she has been working on diet and exercise. 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   Cataract    GERD (gastroesophageal reflux disease)    Hx: UTI (urinary tract infection)    Hyperlipidemia    Hypertension    Prediabetes    Seasonal allergic rhinitis    Vitamin D deficiency      Allergies  Allergen Reactions   Atorvastatin     Myalgias    Codeine Nausea And Vomiting and Other (See Comments)    Severe headaches (tolerates Dilaudid)    Fosamax [Alendronate] Other (See Comments)    Severe arthrlalgias   Hydrocodone-Acetaminophen Nausea And Vomiting and Other (See Comments)    Severe headaches (tolerates Dilaudid)   Oxycodone-Acetaminophen Nausea And Vomiting and Other (See Comments)    Severe headaches (tolerates Dilaudid)   Voltaren [Diclofenac] Rash    Gel    Current Outpatient Medications on File Prior to Visit  Medication Sig   acetaminophen (TYLENOL) 650 MG CR tablet Take 1,300 mg by mouth every 8 (eight) hours as needed for pain.   Aspirin 81 MG CAPS Take 1 capsule every day by oral route.   b complex vitamins capsule Take 1 capsule by mouth daily.   carboxymethylcellulose (REFRESH TEARS) 0.5 % SOLN Place 1 drop into both eyes 3 (three) times  daily as needed (dry eyes).   Cholecalciferol (VITAMIN D) 50 MCG (2000 UT) tablet Take 2,000 Units by mouth daily.   MAGNESIUM PO Take 500 mg by mouth daily. Magesium Oxide   montelukast (SINGULAIR) 10 MG tablet TAKE ONE TABLET DAILY FOR ALLERGY   olmesartan (BENICAR) 40 MG tablet TAKE ONE TABLET DAILY FOR BLOOD PRESSURE   TRELEGY ELLIPTA 200-62.5-25 MCG/ACT AEPB INHALE ONE INHALATION INTO LUNGS DAILY   TURMERIC CURCUMIN PO Take 750 mg by mouth daily.   albuterol (VENTOLIN HFA) 108 (90 Base) MCG/ACT inhaler Use 2 inhalations  15 minutes Apart  every 4 hours  to Rescue Asthma (Patient not taking: Reported on 04/14/2023)   No current facility-administered medications on file  prior to visit.    ROS: all negative except above.   Physical Exam:  BP 136/66   Pulse 71   Temp (!) 97.5 F (36.4 C)   Ht 5\' 2"  (1.575 m)   Wt 140 lb 3.2 oz (63.6 kg)   SpO2 97%   BMI 25.64 kg/m   General Appearance: Well nourished, in no apparent distress. Eyes: PERRLA, EOMs, conjunctiva no swelling or erythema  Neck: Supple, thyroid normal.  Respiratory: Respiratory effort normal, BS equal bilaterally without rales, rhonchi, wheezing or stridor.  Cardio: RRR with no MRGs. Brisk peripheral pulses without edema.  Abdomen: Soft, + BS.  Non tender, no guarding, rebound, hernias, masses. Lymphatics: Non tender without lymphadenopathy.  Musculoskeletal: Full ROM, 5/5 strength, normal gait. Spasm of left and right trapezius Skin: Warm, dry without rashes, lesions, ecchymosis.  Neuro: Cranial nerves intact. Normal muscle tone, no cerebellar symptoms. Sensation intact. Negative tinnel test  Psych: Awake and oriented X 3, normal affect, Insight and Judgment appropriate.     Raynelle Dick, NP 11:45 AM [facility]

## 2023-04-14 ENCOUNTER — Encounter: Payer: Self-pay | Admitting: Nurse Practitioner

## 2023-04-14 ENCOUNTER — Ambulatory Visit (INDEPENDENT_AMBULATORY_CARE_PROVIDER_SITE_OTHER): Payer: PPO | Admitting: Nurse Practitioner

## 2023-04-14 VITALS — BP 136/66 | HR 71 | Temp 97.5°F | Ht 62.0 in | Wt 140.2 lb

## 2023-04-14 DIAGNOSIS — I10 Essential (primary) hypertension: Secondary | ICD-10-CM | POA: Diagnosis not present

## 2023-04-14 DIAGNOSIS — Z1211 Encounter for screening for malignant neoplasm of colon: Secondary | ICD-10-CM

## 2023-04-14 DIAGNOSIS — Z1212 Encounter for screening for malignant neoplasm of rectum: Secondary | ICD-10-CM | POA: Diagnosis not present

## 2023-04-14 DIAGNOSIS — M62838 Other muscle spasm: Secondary | ICD-10-CM | POA: Diagnosis not present

## 2023-04-14 LAB — POC HEMOCCULT BLD/STL (HOME/3-CARD/SCREEN)
Card #2 Fecal Occult Blod, POC: NEGATIVE
Card #3 Fecal Occult Blood, POC: NEGATIVE
Fecal Occult Blood, POC: NEGATIVE

## 2023-04-14 MED ORDER — BACLOFEN 5 MG PO TABS
ORAL_TABLET | ORAL | 0 refills | Status: DC
Start: 2023-04-14 — End: 2023-06-22

## 2023-04-14 NOTE — Patient Instructions (Addendum)
  Use baclofen at bedtime Use hemp oil to neck as needed Heat/ice to neck as needed If symptoms do not improve notify the office and will refer to orthopedics

## 2023-04-18 ENCOUNTER — Encounter: Payer: PPO | Admitting: Internal Medicine

## 2023-04-26 DIAGNOSIS — M79642 Pain in left hand: Secondary | ICD-10-CM

## 2023-04-26 DIAGNOSIS — M25552 Pain in left hip: Secondary | ICD-10-CM | POA: Insufficient documentation

## 2023-04-26 HISTORY — DX: Pain in left hip: M25.552

## 2023-04-26 HISTORY — DX: Pain in left hand: M79.642

## 2023-05-02 ENCOUNTER — Ambulatory Visit: Payer: PPO | Admitting: Nurse Practitioner

## 2023-05-05 ENCOUNTER — Encounter: Payer: PPO | Admitting: Internal Medicine

## 2023-05-11 DIAGNOSIS — G5602 Carpal tunnel syndrome, left upper limb: Secondary | ICD-10-CM | POA: Diagnosis not present

## 2023-05-11 DIAGNOSIS — M67432 Ganglion, left wrist: Secondary | ICD-10-CM | POA: Diagnosis not present

## 2023-05-11 DIAGNOSIS — M180 Bilateral primary osteoarthritis of first carpometacarpal joints: Secondary | ICD-10-CM | POA: Diagnosis not present

## 2023-05-11 HISTORY — DX: Ganglion, left wrist: M67.432

## 2023-05-11 HISTORY — DX: Carpal tunnel syndrome, left upper limb: G56.02

## 2023-05-29 ENCOUNTER — Ambulatory Visit (HOSPITAL_BASED_OUTPATIENT_CLINIC_OR_DEPARTMENT_OTHER): Payer: PPO | Admitting: Internal Medicine

## 2023-05-29 VITALS — BP 140/70 | HR 70 | Ht 62.0 in | Wt 143.0 lb

## 2023-05-29 DIAGNOSIS — I2510 Atherosclerotic heart disease of native coronary artery without angina pectoris: Secondary | ICD-10-CM | POA: Diagnosis not present

## 2023-05-29 DIAGNOSIS — T466X5D Adverse effect of antihyperlipidemic and antiarteriosclerotic drugs, subsequent encounter: Secondary | ICD-10-CM

## 2023-05-29 DIAGNOSIS — E785 Hyperlipidemia, unspecified: Secondary | ICD-10-CM

## 2023-05-29 DIAGNOSIS — M791 Myalgia, unspecified site: Secondary | ICD-10-CM

## 2023-05-29 NOTE — Patient Instructions (Signed)
 Medication Instructions:  NO CHANGES today  *If you need a refill on your cardiac medications before your next appointment, please call your pharmacy*   Lab Work: FASTING NMR lipoprofile and LPa  If you have labs (blood work) drawn today and your tests are completely normal, you will receive your results only by: MyChart Message (if you have MyChart) OR A paper copy in the mail If you have any lab test that is abnormal or we need to change your treatment, we will call you to review the results.  Follow-Up: At [facility], you and your health needs are our priority.  As part of our continuing mission to provide you with exceptional heart care, we have created designated Provider Care Teams.  These Care Teams include your primary Cardiologist (physician) and Advanced Practice Providers (APPs -  Physician Assistants and Nurse Practitioners) who all work together to provide you with the care you need, when you need it.  We recommend signing up for the patient portal called "MyChart".  Sign up information is provided on this After Visit Summary.  MyChart is used to connect with patients for Virtual Visits (Telemedicine).  Patients are able to view lab/test results, encounter notes, upcoming appointments, etc.  Non-urgent messages can be sent to your provider as well.   To learn more about what you can do with MyChart, go to [email or web address].    Your next appointment:   AS NEEDED with Dr. Rennis Golden -- pending lab results

## 2023-05-29 NOTE — Progress Notes (Signed)
 LIPID CLINIC CONSULT NOTE  Chief Complaint:  Statin intolerant  Primary Care Physician: Michaela Shackleton, NP-C  Primary Cardiologist:  None  HPI:  Michaela Kelley is a 83 y.o. female who is being seen today for the evaluation of dyslipidemia at the request of Michaela Cowboy, MD. this a pleasant 83 year old female kindly referred for evaluation management of dyslipidemia.  She has a history of high cholesterol but has been intolerant to atorvastatin and she says she took another statin which was not listed.  Apparently she had also been tried on Zetia and fenofibrate in the past but had clear reasons as to why she was not taking it.  More recently she has been focusing on diet.  Her total cholesterol December was 212 with triglycerides 108, HDL 74 and LDL 117.  She had a calcium score 2023 which was 49.2, 36 percentile for age and sex matched controls.  No early onset heart disease in her family and this would likely be less applicable since she is now 38.  Overall she eats a fairly healthy diet low in saturated fats.  PMHx:  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   Cataract    GERD (gastroesophageal reflux disease)    Hx: UTI (urinary tract infection)    Hyperlipidemia    Hypertension    Prediabetes    Seasonal allergic rhinitis    Vitamin D deficiency     Past Surgical History:  Procedure Laterality Date   BACK SURGERY  1999   L5    DILATION AND CURETTAGE OF UTERUS     KNEE ARTHROPLASTY  2010   L knee   TONSILLECTOMY     TOTAL HIP ARTHROPLASTY Right 01/17/2022   Procedure: TOTAL HIP ARTHROPLASTY ANTERIOR APPROACH;  Surgeon: Michaela Gross, MD;  Location: [facility];  Service: Orthopedics;  Laterality: Right;   TOTAL SHOULDER ARTHROPLASTY  02/10/2011   Procedure: TOTAL SHOULDER ARTHROPLASTY;  Surgeon: Michaela Kelley;  Location: [facility];  Service: Orthopedics;  Laterality: Left;  TOTAL SHOULDER ARTHROPLASTY LEFT  SIDE   TOTAL SHOULDER ARTHROPLASTY  12/22/2011   Procedure: TOTAL SHOULDER ARTHROPLASTY;  Surgeon: Michaela Lange, MD;  Location: [facility];  Service: Orthopedics;  Laterality: Right;  right total shoulder arthroplasty    FAMHx:  Family History  Problem Relation Age of Onset   Breast cancer Mother 31   Hypertension Father    Heart disease Brother    Esophageal cancer Brother    Heart disease Maternal Grandmother    Heart disease Maternal Grandfather    Breast cancer Paternal Grandmother    Diabetes Paternal Grandfather    Colon cancer Neg Hx    Rectal cancer Neg Hx    Stomach cancer Neg Hx     SOCHx:   reports that she quit smoking about 31 years ago. Her smoking use included cigarettes. She started smoking about 51 years ago. She has a 5 pack-year smoking history. She has never used smokeless tobacco. She reports current alcohol use of about 8.0 standard drinks of alcohol per week. She reports that she does not use drugs.  ALLERGIES:  Allergies  Allergen Reactions   Atorvastatin     Myalgias    Codeine Nausea And Vomiting and Other (See Comments)    Severe headaches (tolerates Dilaudid)    Fosamax [Alendronate] Other (See Comments)    Severe arthrlalgias   Hydrocodone-Acetaminophen  Nausea And Vomiting and Other (See Comments)    Severe headaches (tolerates Dilaudid)   Oxycodone-Acetaminophen Nausea And Vomiting and Other (See Comments)    Severe headaches (tolerates Dilaudid)   Voltaren [Diclofenac] Rash    Gel    ROS: Pertinent items noted in HPI and remainder of comprehensive ROS otherwise negative.  HOME MEDS: Current Outpatient Medications on File Prior to Visit  Medication Sig Dispense Refill   acetaminophen (TYLENOL) 650 MG CR tablet Take 1,300 mg by mouth every 8 (eight) hours as needed for pain.     Aspirin 81 MG CAPS Take 1 capsule every day by oral route.     b complex vitamins capsule Take 1 capsule by mouth daily.     carboxymethylcellulose (REFRESH TEARS)  0.5 % SOLN Place 1 drop into both eyes 3 (three) times daily as needed (dry eyes).     Cholecalciferol (VITAMIN D) 50 MCG (2000 UT) tablet Take 2,000 Units by mouth daily.     MAGNESIUM PO Take 500 mg by mouth daily. Magesium Oxide     montelukast (SINGULAIR) 10 MG tablet TAKE ONE TABLET DAILY FOR ALLERGY 90 tablet 3   olmesartan (BENICAR) 40 MG tablet TAKE ONE TABLET DAILY FOR BLOOD PRESSURE 90 tablet 3   TRELEGY ELLIPTA 200-62.5-25 MCG/ACT AEPB INHALE ONE INHALATION INTO LUNGS DAILY 60 each 2   TURMERIC CURCUMIN PO Take 750 mg by mouth daily.     albuterol (VENTOLIN HFA) 108 (90 Base) MCG/ACT inhaler Use 2 inhalations  15 minutes Apart  every 4 hours  to Rescue Asthma (Patient not taking: Reported on 05/29/2023) 48 g 3   Baclofen 5 MG TABS 1 tab at bedtime as needed for muscle spasm (Patient not taking: Reported on 05/29/2023) 30 tablet 0   No current facility-administered medications on file prior to visit.    LABS/IMAGING: No results found for this or any previous visit (from the past 48 hours). No results found.  LIPID PANEL:    Component Value Date/Time   CHOL 242 (H) 03/09/2023 1029   TRIG 74 03/09/2023 1029   HDL 108 03/09/2023 1029   CHOLHDL 2.2 03/09/2023 1029   VLDL 23 06/08/2016 1030   LDLCALC 117 (H) 03/09/2023 1029    WEIGHTS: Wt Readings from Last 3 Encounters:  05/29/23 143 lb (64.9 kg)  04/14/23 140 lb 3.2 oz (63.6 kg)  03/09/23 141 lb 3.2 oz (64 kg)    VITALS: BP (!) 140/70 (BP Location: Left Arm, Patient Position: Sitting, Cuff Size: Normal)   Pulse 70   Ht 5\' 2"  (1.575 m)   Wt 143 lb (64.9 kg)   SpO2 98%   BMI 26.16 kg/m   EXAM: Deferred  EKG: Deferred  ASSESSMENT: Mixed dyslipidemia, 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   Michaela Kelley has a mixed dyslipidemia which is currently being managed with diet alone.  She was intolerant of statins and may have tried Zetia in the past.  Her  target LDL is set at less than 100 because of a low risk calcium score showed a very 36 percentile with some calcium in the LAD but no early onset family history of heart disease.  She wants to reassess her lipids prior to considering other therapeutic options.  I think it is reasonable.  Will check an NMR and LP(a).  Based on these findings we can discuss other therapeutic options.  Thanks again for the kind referral.  She will be  establishing with new primary care physician later this month.  Chrystie Nose, MD, [facility], FACP  [place]  [facility]  Medical Director of the [facility]id [facility] &  Cardiovascular Risk Reduction Clinic Diplomate of the American Board of Clinical Lipidology Attending Cardiologist  Direct Dial: [phone]  Fax: [phone]  Website:  [email or web address] 05/29/2023, 9:56 AM

## 2023-05-31 ENCOUNTER — Encounter: Payer: PPO | Admitting: Internal Medicine

## 2023-06-02 DIAGNOSIS — E785 Hyperlipidemia, unspecified: Secondary | ICD-10-CM | POA: Diagnosis not present

## 2023-06-04 LAB — NMR, LIPOPROFILE
Cholesterol, Total: 222 mg/dL — ABNORMAL HIGH (ref 100–199)
HDL Particle Number: 42.1 umol/L (ref >=30.5)
HDL-C: 103 mg/dL (ref >39)
LDL Particle Number: 1003 nmol/L — ABNORMAL HIGH (ref <1000)
LDL Size: 21.7 nm (ref >20.5)
LDL-C (NIH Calc): 102 mg/dL — ABNORMAL HIGH (ref 0–99)
LP-IR Score: 52 — ABNORMAL HIGH (ref <=45)
Small LDL Particle Number: <90 nmol/L (ref <=527)
Triglycerides: 102 mg/dL (ref 0–149)

## 2023-06-04 LAB — LIPOPROTEIN A (LPA): Lipoprotein (a): 24.2 nmol/L (ref <75.0)

## 2023-06-07 ENCOUNTER — Encounter: Payer: Self-pay | Admitting: Podiatry

## 2023-06-07 ENCOUNTER — Ambulatory Visit: Admitting: Podiatry

## 2023-06-07 ENCOUNTER — Ambulatory Visit (INDEPENDENT_AMBULATORY_CARE_PROVIDER_SITE_OTHER)

## 2023-06-07 DIAGNOSIS — M7751 Other enthesopathy of right foot: Secondary | ICD-10-CM

## 2023-06-07 DIAGNOSIS — M7752 Other enthesopathy of left foot: Secondary | ICD-10-CM

## 2023-06-07 DIAGNOSIS — M722 Plantar fascial fibromatosis: Secondary | ICD-10-CM | POA: Diagnosis not present

## 2023-06-07 MED ORDER — TRIAMCINOLONE ACETONIDE 10 MG/ML IJ SUSP
10.0000 mg | Freq: Once | INTRAMUSCULAR | Status: AC
Start: 2023-06-07 — End: 2023-06-07
  Administered 2023-06-07: 10 mg via INTRA_ARTICULAR

## 2023-06-07 NOTE — Progress Notes (Signed)
 Subjective:   Patient ID: Michaela Kelley, female   DOB: 83 y.o.   MRN: [id number]   HPI Patient states has started to develop pain around the heel again and does have orthotics from the past which have been beneficial.  It has been over 3 years since evaluation patient does not smoke likes to be active   Review of Systems  All other systems reviewed and are negative.       Objective:  Physical Exam Vitals and nursing note reviewed.  Constitutional:      Appearance: She is well-developed.  Pulmonary:     Effort: Pulmonary effort is normal.  Musculoskeletal:        General: Normal range of motion.  Skin:    General: Skin is warm.  Neurological:     Mental Status: She is alert.     Neurovascular status intact muscle strength found to be adequate range of motion adequate with exquisite discomfort plantar aspect heel region bilateral in the proximal portion of the insertional point with pain with palpation.  Good digital perfusion well-oriented     Assessment:  Appears to be acute plantar fasciitis bilateral in the proximal portion     Plan:  H&P reviewed sterile prep and did careful proximal injection 3 mg dexamethasone Kenalog 5 mg Xylocaine advised on cushioning stretching and will be seen back as needed  X-rays dated today do indicate moderate depression of the arch mild osteoporosis no other pathology noted small spur formation bilateral

## 2023-06-19 ENCOUNTER — Other Ambulatory Visit: Payer: Self-pay | Admitting: Family Medicine

## 2023-06-19 DIAGNOSIS — Z1231 Encounter for screening mammogram for malignant neoplasm of breast: Secondary | ICD-10-CM

## 2023-06-22 ENCOUNTER — Telehealth: Payer: Self-pay | Admitting: Family Medicine

## 2023-06-22 ENCOUNTER — Encounter: Payer: Self-pay | Admitting: Family Medicine

## 2023-06-22 ENCOUNTER — Ambulatory Visit (INDEPENDENT_AMBULATORY_CARE_PROVIDER_SITE_OTHER): Payer: PPO | Admitting: Family Medicine

## 2023-06-22 ENCOUNTER — Encounter: Payer: PPO | Admitting: Internal Medicine

## 2023-06-22 VITALS — BP 130/76 | HR 67 | Temp 97.6°F | Ht 62.0 in | Wt 138.0 lb

## 2023-06-22 DIAGNOSIS — I10 Essential (primary) hypertension: Secondary | ICD-10-CM | POA: Diagnosis not present

## 2023-06-22 DIAGNOSIS — R5383 Other fatigue: Secondary | ICD-10-CM | POA: Diagnosis not present

## 2023-06-22 DIAGNOSIS — Z79899 Other long term (current) drug therapy: Secondary | ICD-10-CM

## 2023-06-22 DIAGNOSIS — M810 Age-related osteoporosis without current pathological fracture: Secondary | ICD-10-CM | POA: Diagnosis not present

## 2023-06-22 DIAGNOSIS — J3089 Other allergic rhinitis: Secondary | ICD-10-CM | POA: Diagnosis not present

## 2023-06-22 DIAGNOSIS — E559 Vitamin D deficiency, unspecified: Secondary | ICD-10-CM

## 2023-06-22 DIAGNOSIS — J4520 Mild intermittent asthma, uncomplicated: Secondary | ICD-10-CM | POA: Diagnosis not present

## 2023-06-22 DIAGNOSIS — E871 Hypo-osmolality and hyponatremia: Secondary | ICD-10-CM

## 2023-06-22 DIAGNOSIS — E782 Mixed hyperlipidemia: Secondary | ICD-10-CM

## 2023-06-22 DIAGNOSIS — Z7689 Persons encountering health services in other specified circumstances: Secondary | ICD-10-CM

## 2023-06-22 DIAGNOSIS — D649 Anemia, unspecified: Secondary | ICD-10-CM

## 2023-06-22 LAB — COMPREHENSIVE METABOLIC PANEL WITH GFR
ALT: 22 U/L (ref 0–35)
AST: 19 U/L (ref 0–37)
Albumin: 4.7 g/dL (ref 3.5–5.2)
Alkaline Phosphatase: 61 U/L (ref 39–117)
BUN: 15 mg/dL (ref 6–23)
CO2: 27 meq/L (ref 19–32)
Calcium: 9.5 mg/dL (ref 8.4–10.5)
Chloride: 94 meq/L — ABNORMAL LOW (ref 96–112)
Creatinine, Ser: 0.93 mg/dL (ref 0.40–1.20)
GFR: 57.43 mL/min — ABNORMAL LOW (ref >60.00)
Glucose, Bld: 98 mg/dL (ref 70–99)
Potassium: 4.4 meq/L (ref 3.5–5.1)
Sodium: 129 meq/L — ABNORMAL LOW (ref 135–145)
Total Bilirubin: 0.9 mg/dL (ref 0.2–1.2)
Total Protein: 7.5 g/dL (ref 6.0–8.3)

## 2023-06-22 LAB — CBC WITH DIFFERENTIAL/PLATELET
Basophils Absolute: 0.1 10*3/uL (ref 0.0–0.1)
Basophils Relative: 0.9 % (ref 0.0–3.0)
Eosinophils Absolute: 0.2 10*3/uL (ref 0.0–0.7)
Eosinophils Relative: 3.8 % (ref 0.0–5.0)
HCT: 37.4 % (ref 36.0–46.0)
Hemoglobin: 12.7 g/dL (ref 12.0–15.0)
Lymphocytes Relative: 34.2 % (ref 12.0–46.0)
Lymphs Abs: 2.1 10*3/uL (ref 0.7–4.0)
MCHC: 33.9 g/dL (ref 30.0–36.0)
MCV: 93.7 fl (ref 78.0–100.0)
Monocytes Absolute: 0.7 10*3/uL (ref 0.1–1.0)
Monocytes Relative: 12 % (ref 3.0–12.0)
Neutro Abs: 3.1 10*3/uL (ref 1.4–7.7)
Neutrophils Relative %: 49.1 % (ref 43.0–77.0)
Platelets: 363 10*3/uL (ref 150.0–400.0)
RBC: 4 Mil/uL (ref 3.87–5.11)
RDW: 13.1 % (ref 11.5–15.5)
WBC: 6.2 10*3/uL (ref 4.0–10.5)

## 2023-06-22 LAB — MAGNESIUM: Magnesium: 2.1 mg/dL (ref 1.5–2.5)

## 2023-06-22 MED ORDER — MONTELUKAST SODIUM 10 MG PO TABS: ORAL_TABLET | ORAL | 3 refills | Status: AC

## 2023-06-22 NOTE — Assessment & Plan Note (Signed)
 Is not currently on Tx and has never been on Tx for osteoporosis. Denies recent fracture.  In depth counseling on calcium, vitamin D and weight bearing exercise. Discussed other treatment options. DEXA ordered. Follow up once I have these results.

## 2023-06-22 NOTE — Telephone Encounter (Signed)
 I don't see that we ordered bone density?? Last one was done in 2021 and don't see on health maintenance that it's needed, please advise

## 2023-06-22 NOTE — Assessment & Plan Note (Signed)
 monitor

## 2023-06-22 NOTE — Telephone Encounter (Signed)
 Copied from CRM [id number]. Topic: Referral - Question >> Jun 22, 2023 10:48 AM Deaijah H wrote: Reason for CRM: Patient would like to ask Dahlia Client if she could be referred to someone else for her bone density due to current not being available until December. Please call [phone]

## 2023-06-22 NOTE — Assessment & Plan Note (Signed)
 She has been taking Singulair and doing fine with it. Refilled medication.

## 2023-06-22 NOTE — Assessment & Plan Note (Signed)
 Controlled.   -Continue current medication

## 2023-06-22 NOTE — Telephone Encounter (Signed)
 LM for pt advising of DEXA order placed to the breast center

## 2023-06-22 NOTE — Patient Instructions (Signed)
 Thank you for trusting [place] with your health care.  Please go downstairs for labs before you leave.  We will be in touch with your results and with recommendations.  I would like to see you back in 4 weeks.  If you have any concerns or questions, you may call and ask for [facility].

## 2023-06-22 NOTE — Assessment & Plan Note (Signed)
 Check vitamin D level and replace as warranted

## 2023-06-22 NOTE — Assessment & Plan Note (Signed)
 Controlled. Continue medications as she is taking them.

## 2023-06-22 NOTE — Assessment & Plan Note (Signed)
 Statin intolerant. Reviewed notes from Dr. Rennis Golden. LP(a) negative. Advised to continue with healthy diet and exercise.

## 2023-06-22 NOTE — Assessment & Plan Note (Signed)
 Mild anemia. Recheck CBC. Add ferritin, folate, B12. Negative hemoccult x 3 in January. Reviewed recent medical records and lab results. Follow up pending results.

## 2023-06-22 NOTE — Assessment & Plan Note (Signed)
 No red flag symptoms. She is still quite active but has noticed a decline in energy over the past 3 months or so. Check labs and follow up.

## 2023-06-22 NOTE — Progress Notes (Signed)
 New Patient Office Visit  Subjective    Patient ID: Michaela Kelley, female    DOB: 1941/01/17  Age: 83 y.o. MRN: [id number]  CC:  Chief Complaint  Patient presents with   Establish Care    Feeling tired lately, no real concerns    HPI Michaela Kelley presents to establish care  Main concern today is fatigue. She is very active, plays golf and works in her garden. Denies dizziness, chest pain, palpitations, DOE.   Anemia as of 3 months ago. Denies any blood loss. Negative hemoccult x 3 in January.   Takes vitamin B12 and D some days.   Last colonoscopy 6 years ago  Hx of diverticulitis.   Takes ASA 81 mg  Taking Celbrex some days for joint pain.   Takes magnesium   Allergies and takes Singulair.   Asthma- takes Trelegy and albuterol prn   LLD 117 -sees Dr. Rennis Golden   HTN- taking olmesartan   Mammogram scheduled for 08/11/2023  Mother died from breast cancer.   Eye doctor in may   Osteoporosis - DEXA 2021  T- -3.6 in forearms  Is not on treatment and is not currently interested.       Outpatient Encounter Medications as of 06/22/2023  Medication Sig   acetaminophen (TYLENOL) 650 MG CR tablet Take 1,300 mg by mouth every 8 (eight) hours as needed for pain.   Aspirin 81 MG CAPS Take 1 capsule every day by oral route.   b complex vitamins capsule Take 1 capsule by mouth daily.   carboxymethylcellulose (REFRESH TEARS) 0.5 % SOLN Place 1 drop into both eyes 3 (three) times daily as needed (dry eyes).   Cholecalciferol (VITAMIN D) 50 MCG (2000 UT) tablet Take 2,000 Units by mouth daily.   MAGNESIUM PO Take 500 mg by mouth daily. Magesium Oxide   olmesartan (BENICAR) 40 MG tablet TAKE ONE TABLET DAILY FOR BLOOD PRESSURE   TRELEGY ELLIPTA 200-62.5-25 MCG/ACT AEPB INHALE ONE INHALATION INTO LUNGS DAILY   TURMERIC CURCUMIN PO Take 750 mg by mouth daily.   [DISCONTINUED] montelukast (SINGULAIR) 10 MG tablet TAKE ONE TABLET DAILY FOR ALLERGY   albuterol (VENTOLIN HFA) 108 (90  Base) MCG/ACT inhaler Use 2 inhalations  15 minutes Apart  every 4 hours  to Rescue Asthma (Patient not taking: Reported on 06/22/2023)   montelukast (SINGULAIR) 10 MG tablet TAKE ONE TABLET DAILY FOR ALLERGY   [DISCONTINUED] Baclofen 5 MG TABS 1 tab at bedtime as needed for muscle spasm (Patient not taking: Reported on 06/22/2023)   No facility-administered encounter medications on file as of 06/22/2023.    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   Prediabetes    Seasonal allergic rhinitis    Vitamin D deficiency     Past Surgical History:  Procedure Laterality Date   BACK SURGERY  1999   L5    DILATION AND CURETTAGE OF UTERUS     KNEE  ARTHROPLASTY  2010   L knee   TONSILLECTOMY     TOTAL HIP ARTHROPLASTY Right 01/17/2022   Procedure: TOTAL HIP ARTHROPLASTY ANTERIOR APPROACH;  Surgeon: Ollen Gross, MD;  Location: [facility];  Service: Orthopedics;  Laterality: Right;   TOTAL SHOULDER ARTHROPLASTY  02/10/2011   Procedure: TOTAL SHOULDER ARTHROPLASTY;  Surgeon: Vania Rea Supple;  Location: [facility];  Service: Orthopedics;  Laterality: Left;  TOTAL SHOULDER ARTHROPLASTY LEFT SIDE   TOTAL SHOULDER ARTHROPLASTY  12/22/2011   Procedure: TOTAL SHOULDER ARTHROPLASTY;  Surgeon: Senaida Lange, MD;  Location: [facility];  Service: Orthopedics;  Laterality: Right;  right total shoulder arthroplasty    Family History  Problem Relation Age of Onset   Breast cancer Mother 7   Hypertension Father    Heart disease Brother    Esophageal cancer Brother     Heart disease Maternal Grandmother    Heart disease Maternal Grandfather    Breast cancer Paternal Grandmother    Diabetes Paternal Grandfather    Colon cancer Neg Hx    Rectal cancer Neg Hx    Stomach cancer Neg Hx     Social History   Socioeconomic History   Marital status: Widowed    Spouse name: Not on file   Number of children: 2   Years of education: Not on file   Highest education level: Not on file  Occupational History   Not on file  Tobacco Use   Smoking status: Former    Current packs/day: 0.00    Average packs/day: 0.3 packs/day for 20.0 years (5.0 ttl pk-yrs)    Types: Cigarettes    Start date: 12/16/1971    Quit date: 12/16/1991    Years since quitting: 31.5   Smokeless tobacco: Never  Vaping Use   Vaping status: Never Used  Substance and Sexual Activity   Alcohol use: Yes    Alcohol/week: 8.0 standard drinks of alcohol    Types: 8 Glasses of wine per week    Comment: 6-8 glasses per week    Drug use: No   Sexual activity: Not Currently    Partners: Male    Birth control/protection: Post-menopausal  Other Topics Concern   Not on file  Social History Narrative   Not on file   Social Drivers of Health   Financial Resource Strain: Not on file  Food Insecurity: No Food Insecurity (01/17/2022)   Hunger Vital Sign    Worried About Running Out of Food in the Last Year: Never true    Ran Out of Food in the Last Year: Never true  Transportation Needs: No Transportation Needs (01/17/2022)   PRAPARE - Administrator, Civil Service (Medical): No    Lack of Transportation (Non-Medical): No  Physical Activity: Sufficiently Active (02/12/2019)   Exercise Vital Sign    Days of Exercise per Week: 5 days    Minutes of Exercise per Session: 30 min  Stress: No Stress Concern Present (02/12/2019)   [employer] of Occupational Health - Occupational Stress Questionnaire    Feeling of Stress : Only a little  Social Connections: Not on file   Intimate Partner Violence: Not At Risk (01/17/2022)   Humiliation, Afraid, Rape, and Kick questionnaire    Fear of Current or Ex-Partner: No    Emotionally Abused: No    Physically Abused: No    Sexually Abused: No    Review of Systems  Constitutional:  Positive for malaise/fatigue. Negative for chills and fever.  HENT:  Negative  for congestion, sinus pain and sore throat.   Eyes:  Negative for blurred vision and double vision.  Respiratory:  Negative for cough and shortness of breath.   Cardiovascular:  Negative for chest pain, palpitations and leg swelling.  Gastrointestinal:  Negative for abdominal pain, blood in stool, constipation, diarrhea, nausea and vomiting.  Genitourinary:  Negative for dysuria, frequency and urgency.  Musculoskeletal:  Positive for joint pain. Negative for falls.  Skin:  Negative for rash.  Neurological:  Negative for dizziness, tingling, focal weakness and headaches.        Objective    BP 130/76 (BP Location: Left Arm, Patient Position: Sitting)   Pulse 67   Temp 97.6 F (36.4 C) (Temporal)   Ht 5\' 2"  (1.575 m)   Wt 138 lb (62.6 kg)   SpO2 99%   BMI 25.24 kg/m   Physical Exam Constitutional:      General: She is not in acute distress.    Appearance: She is not ill-appearing.  HENT:     Mouth/Throat:     Mouth: Mucous membranes are moist.  Eyes:     Extraocular Movements: Extraocular movements intact.     Conjunctiva/sclera: Conjunctivae normal.  Cardiovascular:     Rate and Rhythm: Normal rate and regular rhythm.  Pulmonary:     Effort: Pulmonary effort is normal.     Breath sounds: Normal breath sounds.  Abdominal:     General: There is no distension.     Palpations: Abdomen is soft.     Tenderness: There is no abdominal tenderness. There is no guarding or rebound.  Musculoskeletal:     Cervical back: Normal range of motion and neck supple.     Right lower leg: No edema.     Left lower leg: No edema.  Skin:    General: Skin  is warm and dry.     Coloration: Skin is not pale.  Neurological:     General: No focal deficit present.     Mental Status: She is alert and oriented to person, place, and time.     Motor: No weakness.     Coordination: Coordination normal.     Gait: Gait normal.  Psychiatric:        Mood and Affect: Mood normal.        Behavior: Behavior normal.        Thought Content: Thought content normal.         Assessment & Plan:   Problem List Items Addressed This Visit     Anemia   Mild anemia. Recheck CBC. Add ferritin, folate, B12. Negative hemoccult x 3 in January. Reviewed recent medical records and lab results. Follow up pending results.       Relevant Orders   CBC with Differential/Platelet   Comprehensive metabolic panel with GFR   Folate   Ferritin   Vitamin B12   Chronic hyponatremia   monitor      Relevant Orders   Comprehensive metabolic panel with GFR   Environmental and seasonal allergies   She has been taking Singulair and doing fine with it. Refilled medication.       Essential hypertension   Controlled. Continue current medication.       Relevant Orders   CBC with Differential/Platelet   Comprehensive metabolic panel with GFR   TSH   T4, free   Fatigue - Primary   No red flag symptoms. She is still quite active but has noticed a decline in energy over the  past 3 months or so. Check labs and follow up.       Relevant Orders   CBC with Differential/Platelet   Comprehensive metabolic panel with GFR   Folate   Ferritin   TSH   T4, free   VITAMIN D 25 Hydroxy (Vit-D Deficiency, Fractures)   Hyperlipidemia, mixed   Statin intolerant. Reviewed notes from Dr. Rennis Golden. LP(a) negative. Advised to continue with healthy diet and exercise.       Mild intermittent asthma without complication   Controlled. Continue medications as she is taking them.       Relevant Medications   montelukast (SINGULAIR) 10 MG tablet   Osteoporosis   Is not currently on  Tx and has never been on Tx for osteoporosis. Denies recent fracture.  In depth counseling on calcium, vitamin D and weight bearing exercise. Discussed other treatment options. DEXA ordered. Follow up once I have these results.       Relevant Orders   VITAMIN D 25 Hydroxy (Vit-D Deficiency, Fractures)   DG Bone Density   Vitamin D deficiency   Check vitamin D level and replace as warranted.       Relevant Orders   VITAMIN D 25 Hydroxy (Vit-D Deficiency, Fractures)   Other Visit Diagnoses       Encounter to establish care         Medication management       Relevant Orders   Magnesium      Visit time 32 minutes in face to face communication with patient and coordination of care, additional 15 minutes spent in record review, coordination or care, ordering tests, communicating/referring to other healthcare professionals, documenting in medical records all on the same day of the visit for total time 47 minutes spent on the visit.    Return in about 4 weeks (around 07/20/2023) for chronic health conditions.   Hetty Blend, NP-C

## 2023-06-23 LAB — VITAMIN B12: Vitamin B-12: 206 pg/mL — ABNORMAL LOW (ref 211–911)

## 2023-06-23 LAB — FOLATE: Folate: 11.8 ng/mL (ref >5.9)

## 2023-06-23 LAB — FERRITIN: Ferritin: 272.3 ng/mL (ref 10.0–291.0)

## 2023-06-23 LAB — T4, FREE: Free T4: 0.64 ng/dL (ref 0.60–1.60)

## 2023-06-23 LAB — VITAMIN D 25 HYDROXY (VIT D DEFICIENCY, FRACTURES): VITD: 84.44 ng/mL (ref 30.00–100.00)

## 2023-06-23 LAB — TSH: TSH: 1.37 u[IU]/mL (ref 0.35–5.50)

## 2023-06-26 NOTE — Progress Notes (Signed)
 Her vitamin B12 is low.  I recommend once weekly vitamin B12 injections for 4 weeks.  I then recommend monthly B12 injections.  Taking oral vitamin B12 is not adequate.  This is what is contributing to her fatigue and should improve.  No sign of anemia.  Her sodium level is low but has been chronically and stable.  She also has mild chronic kidney disease.  We will continue to monitor.  No other concerns today.  I will see her back in 4 weeks as discussed.

## 2023-06-29 ENCOUNTER — Ambulatory Visit

## 2023-07-05 ENCOUNTER — Ambulatory Visit
Admission: RE | Admit: 2023-07-05 | Discharge: 2023-07-05 | Disposition: A | Discharge status: Home / Self Care | Source: Ambulatory Visit | Attending: Family Medicine | Admitting: Family Medicine

## 2023-07-05 DIAGNOSIS — Z1231 Encounter for screening mammogram for malignant neoplasm of breast: Secondary | ICD-10-CM | POA: Diagnosis not present

## 2023-07-07 ENCOUNTER — Ambulatory Visit

## 2023-07-07 DIAGNOSIS — E538 Deficiency of other specified B group vitamins: Secondary | ICD-10-CM | POA: Diagnosis not present

## 2023-07-07 MED ORDER — CYANOCOBALAMIN 1000 MCG/ML IJ SOLN
1000.0000 ug | Freq: Once | INTRAMUSCULAR | Status: AC
  Administered 2023-07-07: 1000 ug via INTRAMUSCULAR

## 2023-07-07 NOTE — Progress Notes (Signed)
 Patient presented in office for her B12 injection per Hetty Blend NP-C. This was her 1st weekly injection out of her 4. B12 was administered into her Left Deltoid muscle. Patient tolerated the injection well and the injection site looked fine. Patient advised to report to the office immediately if she notices any adverse reaction. She gave a verbal understanding. Patient is scheduled for her next injection 4/21 at 9am.

## 2023-07-11 DIAGNOSIS — M25552 Pain in left hip: Secondary | ICD-10-CM | POA: Diagnosis not present

## 2023-07-12 ENCOUNTER — Encounter: Payer: Self-pay | Admitting: Podiatry

## 2023-07-12 ENCOUNTER — Ambulatory Visit: Admitting: Podiatry

## 2023-07-12 DIAGNOSIS — M7662 Achilles tendinitis, left leg: Secondary | ICD-10-CM

## 2023-07-12 DIAGNOSIS — M722 Plantar fascial fibromatosis: Secondary | ICD-10-CM

## 2023-07-12 NOTE — Progress Notes (Signed)
 Subjective:   Patient ID: Michaela Kelley, female   DOB: 83 y.o.   MRN: [id number]   HPI Patient states the back of her heels left over right still really bothering her and making it hard for her to sleep or do activities.  States medicine helps somewhat but still giving her trouble   ROS      Objective:  Physical Exam  Neurovascular status intact muscle strength found to be adequate range of motion adequate with exquisite discomfort posterior aspect of the heel region bilateral plantar with thin skin and minimal fat pad in the area     Assessment:  Inflammatory condition very difficult to quantitate with sensitive skin formation     Plan:  H&P reviewed and I have recommended cushioning in the area and utilization of silicone padding.  I explained offloading and will get pillows for the bottom of the heels and will be seen back as symptoms indicated but I do not see anything else do not recommend current ice therapy for this or other treatments

## 2023-07-17 ENCOUNTER — Ambulatory Visit (INDEPENDENT_AMBULATORY_CARE_PROVIDER_SITE_OTHER)

## 2023-07-17 DIAGNOSIS — E538 Deficiency of other specified B group vitamins: Secondary | ICD-10-CM

## 2023-07-17 MED ORDER — CYANOCOBALAMIN 1000 MCG/ML IJ SOLN
1000.0000 ug | Freq: Once | INTRAMUSCULAR | Status: AC
  Administered 2023-07-17: 1000 ug via INTRAMUSCULAR

## 2023-07-17 NOTE — Progress Notes (Signed)
 Patient visits today to receive her B12 injection/vaccine. Patient was informed and tolerated well. Patient was notified to reach out to us  if needed.   Patient schedule for next injection 4/28 @ 320p

## 2023-07-20 ENCOUNTER — Encounter: Payer: Self-pay | Admitting: Family Medicine

## 2023-07-20 ENCOUNTER — Ambulatory Visit (INDEPENDENT_AMBULATORY_CARE_PROVIDER_SITE_OTHER): Admitting: Family Medicine

## 2023-07-20 VITALS — BP 126/78 | HR 70 | Temp 97.6°F | Ht 62.0 in | Wt 140.0 lb

## 2023-07-20 DIAGNOSIS — E2839 Other primary ovarian failure: Secondary | ICD-10-CM | POA: Diagnosis not present

## 2023-07-20 DIAGNOSIS — I10 Essential (primary) hypertension: Secondary | ICD-10-CM

## 2023-07-20 DIAGNOSIS — N183 Chronic kidney disease, stage 3 unspecified: Secondary | ICD-10-CM | POA: Diagnosis not present

## 2023-07-20 DIAGNOSIS — E538 Deficiency of other specified B group vitamins: Secondary | ICD-10-CM

## 2023-07-20 DIAGNOSIS — E871 Hypo-osmolality and hyponatremia: Secondary | ICD-10-CM | POA: Diagnosis not present

## 2023-07-20 DIAGNOSIS — M810 Age-related osteoporosis without current pathological fracture: Secondary | ICD-10-CM

## 2023-07-20 NOTE — Addendum Note (Signed)
 Addended by: Bocephus Cali E on: 07/20/2023 02:21 PM   Modules accepted: Orders

## 2023-07-20 NOTE — Progress Notes (Signed)
 Subjective:     Patient ID: Michaela Kelley, female    DOB: 05/19/1940, 83 y.o.   MRN: [id number]  Chief Complaint  Patient presents with   Medical Management of Chronic Issues    4 week f/u    HPI History of Present Illness         Here for 4 wk follow up on chronic health conditions.   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 She is getting weekly B12 injections and after 4 weeks will start oral B12   DEXA ordered. She has tried oral alendronate  in the past for osteoporosis and had severe arthralgias. Last DEXA 2021.   Taking vitamin D  supplement.  Gets plenty of calcium  in her diet.   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   Prediabetes    Seasonal allergic rhinitis    Vitamin D  deficiency     Past Surgical History:  Procedure Laterality Date   BACK SURGERY  1999   L5    DILATION AND CURETTAGE OF UTERUS     KNEE ARTHROPLASTY  2010   L knee   TONSILLECTOMY     TOTAL HIP ARTHROPLASTY Right 01/17/2022   Procedure: TOTAL HIP ARTHROPLASTY ANTERIOR APPROACH;  Surgeon: Liliane Rei, MD;  Location: [facility];  Service: Orthopedics;  Laterality: Right;   TOTAL SHOULDER ARTHROPLASTY  02/10/2011   Procedure: TOTAL SHOULDER ARTHROPLASTY;  Surgeon: Genevieve Kerry Supple;  Location: [facility];  Service: Orthopedics;  Laterality:  Left;  TOTAL SHOULDER ARTHROPLASTY LEFT SIDE   TOTAL SHOULDER ARTHROPLASTY  12/22/2011   Procedure: TOTAL SHOULDER ARTHROPLASTY;  Surgeon: Glo Larch, MD;  Location: [facility];  Service: Orthopedics;  Laterality: Right;  right total shoulder arthroplasty    Family History  Problem Relation Age of Onset   Breast cancer Mother 38   Hypertension Father    Heart disease Brother    Esophageal cancer Brother    Heart disease Maternal Grandmother    Heart disease Maternal Grandfather    Breast cancer Paternal Grandmother    Diabetes Paternal Grandfather    Colon cancer Neg Hx    Rectal cancer Neg Hx    Stomach cancer Neg Hx     Social History   Socioeconomic History   Marital status: Widowed    Spouse name: Not on file   Number of children: 2   Years of education: Not on file   Highest education level: Not on file  Occupational History   Not on file  Tobacco Use   Smoking status: Former    Current packs/day: 0.00    Average packs/day: 0.3 packs/day for 20.0 years (5.0 ttl pk-yrs)    Types: Cigarettes  Start date: 12/16/1971    Quit date: 12/16/1991    Years since quitting: 31.6   Smokeless tobacco: Never  Vaping Use   Vaping status: Never Used  Substance and Sexual Activity   Alcohol  use: Yes    Alcohol /week: 8.0 standard drinks of alcohol     Types: 8 Glasses of wine per week    Comment: 6-8 glasses per week    Drug use: No   Sexual activity: Not Currently    Partners: Male    Birth control/protection: Post-menopausal  Other Topics Concern   Not on file  Social History Narrative   Not on file   Social Drivers of Health   Financial Resource Strain: Not on file  Food Insecurity: No Food Insecurity (01/17/2022)   Hunger Vital Sign    Worried About Running Out of Food in the Last Year: Never true    Ran Out of Food in the Last Year: Never true  Transportation Needs: No Transportation Needs (01/17/2022)   PRAPARE - Administrator, Civil Service (Medical):  No    Lack of Transportation (Non-Medical): No  Physical Activity: Sufficiently Active (02/12/2019)   Exercise Vital Sign    Days of Exercise per Week: 5 days    Minutes of Exercise per Session: 30 min  Stress: No Stress Concern Present (02/12/2019)   [employer] of Occupational Health - Occupational Stress Questionnaire    Feeling of Stress : Only a little  Social Connections: Not on file  Intimate Partner Violence: Not At Risk (01/17/2022)   Humiliation, Afraid, Rape, and Kick questionnaire    Fear of Current or Ex-Partner: No    Emotionally Abused: No    Physically Abused: No    Sexually Abused: No    Outpatient Medications Prior to Visit  Medication Sig Dispense Refill   acetaminophen  (TYLENOL ) 650 MG CR tablet Take 1,300 mg by mouth every 8 (eight) hours as needed for pain.     albuterol  (VENTOLIN  HFA) 108 (90 Base) MCG/ACT inhaler Use 2 inhalations  15 minutes Apart  every 4 hours  to Rescue Asthma 48 g 3   Aspirin  81 MG CAPS Take 1 capsule every day by oral route.     b complex vitamins capsule Take 1 capsule by mouth daily.     carboxymethylcellulose (REFRESH TEARS) 0.5 % SOLN Place 1 drop into both eyes 3 (three) times daily as needed (dry eyes).     Cholecalciferol  (VITAMIN D ) 50 MCG (2000 UT) tablet Take 2,000 Units by mouth daily.     MAGNESIUM  PO Take 500 mg by mouth daily. Magesium Oxide     montelukast  (SINGULAIR ) 10 MG tablet TAKE ONE TABLET DAILY FOR ALLERGY 90 tablet 3   olmesartan  (BENICAR ) 40 MG tablet TAKE ONE TABLET DAILY FOR BLOOD PRESSURE 90 tablet 3   TRELEGY ELLIPTA  200-62.5-25 MCG/ACT AEPB INHALE ONE INHALATION INTO LUNGS DAILY 60 each 2   TURMERIC CURCUMIN PO Take 750 mg by mouth daily.     No facility-administered medications prior to visit.    Allergies  Allergen Reactions   Atorvastatin      Myalgias    Codeine Nausea And Vomiting and Other (See Comments)    Severe headaches (tolerates Dilaudid )    Fosamax  [Alendronate ] Other (See  Comments)    Severe arthrlalgias   Hydrocodone -Acetaminophen  Nausea And Vomiting and Other (See Comments)    Severe headaches (tolerates Dilaudid )   Oxycodone -Acetaminophen  Nausea And Vomiting and Other (See Comments)    Severe headaches (tolerates Dilaudid )  Voltaren [Diclofenac] Rash    Gel    Review of Systems  Constitutional:  Negative for chills, fever and malaise/fatigue.  Respiratory:  Negative for shortness of breath.   Cardiovascular:  Negative for chest pain, palpitations and leg swelling.  Gastrointestinal:  Negative for abdominal pain, constipation, diarrhea, nausea and vomiting.  Genitourinary:  Negative for dysuria, frequency and urgency.  Neurological:  Negative for dizziness, focal weakness and headaches.  Psychiatric/Behavioral:  Negative for depression. The patient is not nervous/anxious.        Objective:    Physical Exam Constitutional:      General: She is not in acute distress.    Appearance: She is not ill-appearing.  Eyes:     Extraocular Movements: Extraocular movements intact.     Conjunctiva/sclera: Conjunctivae normal.  Cardiovascular:     Rate and Rhythm: Normal rate.  Pulmonary:     Effort: Pulmonary effort is normal.  Musculoskeletal:     Cervical back: Normal range of motion and neck supple.     Right lower leg: No edema.     Left lower leg: No edema.  Skin:    General: Skin is warm and dry.  Neurological:     General: No focal deficit present.     Mental Status: She is alert and oriented to person, place, and time.     Motor: No weakness.     Coordination: Coordination normal.     Gait: Gait normal.  Psychiatric:        Mood and Affect: Mood normal.        Behavior: Behavior normal.        Thought Content: Thought content normal.      BP 126/78 (BP Location: Left Arm, Patient Position: Sitting)   Pulse 70   Temp 97.6 F (36.4 C) (Temporal)   Ht 5\' 2"  (1.575 m)   Wt 140 lb (63.5 kg)   SpO2 99%   BMI 25.61 kg/m  Wt  Readings from Last 3 Encounters:  07/20/23 140 lb (63.5 kg)  06/22/23 138 lb (62.6 kg)  05/29/23 143 lb (64.9 kg)       Assessment & Plan:   Problem List Items Addressed This Visit     B12 deficiency - Primary   Chronic hyponatremia   Essential hypertension   Osteoporosis   Relevant Orders   DG Bone Density   Other Visit Diagnoses       Stage 3 chronic kidney disease, unspecified whether stage 3a or 3b CKD (HCC)         Estrogen deficiency       Relevant Orders   DG Bone Density      Continue vitamin B12 injections for the next 2 weeks and then switch to over-the-counter vitamin B12. She has had chronic hyponatremia for as long as she can remember.  We discussed that this may be her baseline. Osteoporosis and currently is not currently treating it.  She is getting adequate calcium  and vitamin D .  Weightbearing exercises.  History of severe arthralgias with alendronate .  We discussed getting her next bone density and then a referral to osteoporosis clinic.  She may need Prolia or Evenity. She will follow-up here for a fasting CPE and may also do the AWV with a nurse on the same day.  I am having Jinx Gilden. Claunch maintain her MAGNESIUM  PO, TURMERIC CURCUMIN PO, Vitamin D , b complex vitamins, acetaminophen , carboxymethylcellulose, albuterol , Aspirin , Trelegy Ellipta , olmesartan , and montelukast .  No orders of the defined types  were placed in this encounter.

## 2023-07-24 ENCOUNTER — Ambulatory Visit (INDEPENDENT_AMBULATORY_CARE_PROVIDER_SITE_OTHER)

## 2023-07-24 DIAGNOSIS — E538 Deficiency of other specified B group vitamins: Secondary | ICD-10-CM

## 2023-07-24 MED ORDER — CYANOCOBALAMIN 1000 MCG/ML IJ SOLN
1000.0000 ug | Freq: Once | INTRAMUSCULAR | Status: AC
Start: 2023-07-24 — End: 2023-07-24
  Administered 2023-07-24: 1000 ug via INTRAMUSCULAR

## 2023-07-24 NOTE — Progress Notes (Cosign Needed)
 Pt here for weekly B12 injection per Alyson Back, NP  B12 1000mcg given IM and pt tolerated injection well.  Medical screening examination/treatment/procedure(s) were performed by non-physician practitioner and as supervising physician I was immediately available for consultation/collaboration.  I agree with above. Adelaide Holy, MD

## 2023-07-31 ENCOUNTER — Ambulatory Visit (INDEPENDENT_AMBULATORY_CARE_PROVIDER_SITE_OTHER)

## 2023-07-31 DIAGNOSIS — E538 Deficiency of other specified B group vitamins: Secondary | ICD-10-CM

## 2023-07-31 MED ORDER — CYANOCOBALAMIN 1000 MCG/ML IJ SOLN
1000.0000 ug | Freq: Once | INTRAMUSCULAR | Status: AC
  Administered 2023-07-31: 1000 ug via INTRAMUSCULAR

## 2023-07-31 NOTE — Progress Notes (Signed)
Pt was given B12 injection w/o any complications. 

## 2023-08-11 ENCOUNTER — Other Ambulatory Visit

## 2023-08-15 ENCOUNTER — Ambulatory Visit

## 2023-08-17 DIAGNOSIS — M25552 Pain in left hip: Secondary | ICD-10-CM | POA: Diagnosis not present

## 2023-08-23 DIAGNOSIS — H52203 Unspecified astigmatism, bilateral: Secondary | ICD-10-CM | POA: Diagnosis not present

## 2023-08-23 DIAGNOSIS — H26491 Other secondary cataract, right eye: Secondary | ICD-10-CM | POA: Diagnosis not present

## 2023-08-23 DIAGNOSIS — H35372 Puckering of macula, left eye: Secondary | ICD-10-CM | POA: Diagnosis not present

## 2023-08-23 DIAGNOSIS — Z961 Presence of intraocular lens: Secondary | ICD-10-CM | POA: Diagnosis not present

## 2023-08-31 ENCOUNTER — Telehealth: Payer: Self-pay | Admitting: Family Medicine

## 2023-08-31 NOTE — Telephone Encounter (Signed)
 Called pt and scheduled appt for tomorrow

## 2023-08-31 NOTE — Telephone Encounter (Signed)
 Copied from CRM [id number]. Topic: Clinical - Medical Advice >> Aug 30, 2023  4:28 PM Sophia H wrote: Reason for CRM: Patient states she went for a hearing aid check and was told she has a build up of wax in her left ear. Patient is wanting to know if she can have her ears cleaned at the clinic here or if she needs to be referred out. Please advise, best contact # [phone]  ---  Can she be seen by anyone in office for an ear cleaning, or do only some providers do it?

## 2023-09-01 ENCOUNTER — Ambulatory Visit: Admitting: Internal Medicine

## 2023-09-01 ENCOUNTER — Encounter: Payer: Self-pay | Admitting: Internal Medicine

## 2023-09-01 VITALS — BP 122/76 | HR 73 | Temp 98.3°F | Ht 62.0 in | Wt 141.0 lb

## 2023-09-01 DIAGNOSIS — H6120 Impacted cerumen, unspecified ear: Secondary | ICD-10-CM | POA: Insufficient documentation

## 2023-09-01 DIAGNOSIS — H9193 Unspecified hearing loss, bilateral: Secondary | ICD-10-CM | POA: Insufficient documentation

## 2023-09-01 DIAGNOSIS — H6123 Impacted cerumen, bilateral: Secondary | ICD-10-CM

## 2023-09-01 DIAGNOSIS — E559 Vitamin D deficiency, unspecified: Secondary | ICD-10-CM

## 2023-09-01 DIAGNOSIS — E538 Deficiency of other specified B group vitamins: Secondary | ICD-10-CM

## 2023-09-01 NOTE — Progress Notes (Signed)
 Patient ID: Michaela Kelley, female   DOB: 11/07/1940, 83 y.o.   MRN: [id number]        Chief Complaint: follow up left > right hearing worsening despite hearing aids, low b12 and D       HPI:  Michaela Kelley is a 83 y.o. female here with c/o above after seeing hearing specialist who suggested she needs wax removed prior to any change in the aids..   Pt denies chest pain, increased sob or doe, wheezing, orthopnea, PND, increased LE swelling, palpitations, dizziness or syncope.   Pt denies polydipsia, polyuria, or new focal neuro s/s.         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   Prediabetes    Seasonal allergic rhinitis    Vitamin D  deficiency    Past Surgical History:  Procedure Laterality Date   BACK SURGERY  1999   L5    DILATION AND CURETTAGE OF UTERUS     KNEE ARTHROPLASTY  2010   L knee   TONSILLECTOMY     TOTAL HIP ARTHROPLASTY Right 01/17/2022   Procedure: TOTAL HIP ARTHROPLASTY ANTERIOR APPROACH;  Surgeon: Liliane Rei, MD;  Location: [facility];  Service: Orthopedics;  Laterality: Right;   TOTAL SHOULDER ARTHROPLASTY  02/10/2011   Procedure: TOTAL SHOULDER ARTHROPLASTY;  Surgeon: Genevieve Kerry Supple;  Location: [facility];  Service: Orthopedics;  Laterality: Left;  TOTAL SHOULDER ARTHROPLASTY LEFT SIDE   TOTAL  SHOULDER ARTHROPLASTY  12/22/2011   Procedure: TOTAL SHOULDER ARTHROPLASTY;  Surgeon: Glo Larch, MD;  Location: [facility];  Service: Orthopedics;  Laterality: Right;  right total shoulder arthroplasty    reports that she quit smoking about 31 years ago. Her smoking use included cigarettes. She started smoking about 51 years ago. She has a 5 pack-year smoking history. She has never used smokeless tobacco. She reports current alcohol  use of about 8.0 standard drinks of alcohol  per week. She reports that she does not use drugs. family history includes Breast cancer in her paternal grandmother; Breast cancer (age of onset: 47) in her mother; Diabetes in her paternal grandfather; Esophageal cancer in her brother; Heart disease in her brother, maternal grandfather, and maternal grandmother; Hypertension in her father. Allergies  Allergen Reactions   Atorvastatin      Myalgias    Codeine Nausea And Vomiting and Other (See Comments)    Severe headaches (tolerates Dilaudid )    Fosamax  [Alendronate ] Other (See Comments)    Severe arthrlalgias   Hydrocodone -Acetaminophen  Nausea And Vomiting and Other (See Comments)    Severe headaches (tolerates Dilaudid )   Oxycodone -Acetaminophen  Nausea And Vomiting and Other (See Comments)    Severe  headaches (tolerates Dilaudid )   Voltaren [Diclofenac] Rash    Gel   Current Outpatient Medications on File Prior to Visit  Medication Sig Dispense Refill   acetaminophen  (TYLENOL ) 650 MG CR tablet Take 1,300 mg by mouth every 8 (eight) hours as needed for pain.     albuterol  (VENTOLIN  HFA) 108 (90 Base) MCG/ACT inhaler Use 2 inhalations  15 minutes Apart  every 4 hours  to Rescue Asthma 48 g 3   Aspirin  81 MG CAPS Take 1 capsule every day by oral route.     b complex vitamins capsule Take 1 capsule by mouth daily.     carboxymethylcellulose (REFRESH TEARS) 0.5 % SOLN Place 1 drop into both eyes 3 (three) times daily as needed (dry eyes).     celecoxib (CELEBREX) 200  MG capsule Take 200 mg by mouth daily as needed.     Cholecalciferol  (VITAMIN D ) 50 MCG (2000 UT) tablet Take 2,000 Units by mouth daily.     MAGNESIUM  PO Take 500 mg by mouth daily. Magesium Oxide     montelukast  (SINGULAIR ) 10 MG tablet TAKE ONE TABLET DAILY FOR ALLERGY 90 tablet 3   olmesartan  (BENICAR ) 40 MG tablet TAKE ONE TABLET DAILY FOR BLOOD PRESSURE 90 tablet 3   TRELEGY ELLIPTA  200-62.5-25 MCG/ACT AEPB INHALE ONE INHALATION INTO LUNGS DAILY 60 each 2   TURMERIC CURCUMIN PO Take 750 mg by mouth daily.     No current facility-administered medications on file prior to visit.        ROS:  All others reviewed and negative.  Objective        PE:  BP 122/76 (BP Location: Left Arm, Patient Position: Sitting, Cuff Size: Normal)   Pulse 73   Temp 98.3 F (36.8 C) (Oral)   Ht 5\' 2"  (1.575 m)   Wt 141 lb (64 kg)   SpO2 99%   BMI 25.79 kg/m                 Constitutional: Pt appears in NAD               HENT: Head: NCAT.                Right Ear: External ear normal.                 Left Ear: External ear normal. Bialteral cerumen impactions resolved               Eyes: . Pupils are equal, round, and reactive to light. Conjunctivae and EOM are normal               Nose: without d/c or deformity               Neck: Neck supple. Gross normal ROM               Cardiovascular: Normal rate and regular rhythm.                 Pulmonary/Chest: Effort normal and breath sounds without rales or wheezing.                Abd:  Soft, NT, ND, + BS, no organomegaly               Neurological: Pt is alert. At baseline orientation, motor grossly intact               Skin: Skin is warm. No rashes, no other new lesions, LE edema - none  Psychiatric: Pt behavior is normal without agitation   Micro: none  Cardiac tracings I have personally interpreted today:  none  Pertinent Radiological findings (summarize): none   Lab Results  Component Value Date   WBC 6.2 06/22/2023   HGB  12.7 06/22/2023   HCT 37.4 06/22/2023   PLT 363.0 06/22/2023   GLUCOSE 98 06/22/2023   CHOL 242 (H) 03/09/2023   TRIG 74 03/09/2023   HDL 108 03/09/2023   LDLCALC 117 (H) 03/09/2023   ALT 22 06/22/2023   AST 19 06/22/2023   NA 129 (L) 06/22/2023   K 4.4 06/22/2023   CL 94 (L) 06/22/2023   CREATININE 0.93 06/22/2023   BUN 15 06/22/2023   CO2 27 06/22/2023   TSH 1.37 06/22/2023   INR 1.0 12/28/2021   HGBA1C 5.3 11/24/2022   MICROALBUR 0.4 05/03/2022   Assessment/Plan:  Michaela Kelley is a 83 y.o. White or Caucasian [1] female with  has a past medical history of Allergy, Arthritis, Cancer (HCC), Carpal tunnel syndrome of left wrist (05/11/2023), Cataract, Ganglion, left wrist (05/11/2023), GERD (gastroesophageal reflux disease), UTI (urinary tract infection), Hyperlipidemia, Hypertension, Pain in right hip (09/16/2021), Pain in right knee (06/27/2022), Pain of left hand (04/26/2023), Pain of left hip joint (04/26/2023), Palmar fascial fibromatosis (dupuytren) (06/18/2021), Prediabetes, Seasonal allergic rhinitis, and Vitamin D  deficiency.  Cerumen impaction Resolved today, hearing improved,  to f/u any worsening symptoms or concerns  Bilateral hearing loss Pt encouraged to reapply at hearing center for improved hearing aids  Vitamin D  deficiency Last vitamin D  Lab Results  Component Value Date   VD25OH 84.44 06/22/2023   Stable, cont oral replacement   B12 deficiency Lab Results  Component Value Date   VITAMINB12 206 (L) 06/22/2023   Low, to start oral replacement - b12 1000 mcg qd  Followup: Return if symptoms worsen or fail to improve.  Rosalia Colonel, MD 09/01/2023 8:24 PM [facility]

## 2023-09-01 NOTE — Assessment & Plan Note (Signed)
Resolved today,hearing improved,  to f/u any worsening symptoms or concerns

## 2023-09-01 NOTE — Assessment & Plan Note (Signed)
 Lab Results  Component Value Date   VITAMINB12 206 (L) 06/22/2023   Low, to start oral replacement - b12 1000 mcg qd

## 2023-09-01 NOTE — Assessment & Plan Note (Signed)
 Pt encouraged to reapply at hearing center for improved hearing aids

## 2023-09-01 NOTE — Assessment & Plan Note (Signed)
 Last vitamin D  Lab Results  Component Value Date   VD25OH 84.44 06/22/2023   Stable, cont oral replacement

## 2023-09-01 NOTE — Patient Instructions (Signed)
 You had the left and right ear canals cleared today  Please continue all other medications as before, and refills have been done if requested.  Please have the pharmacy call with any other refills you may need.  Please keep your appointments with your specialists as you may have planned

## 2023-09-08 DIAGNOSIS — M25552 Pain in left hip: Secondary | ICD-10-CM | POA: Diagnosis not present

## 2023-09-12 DIAGNOSIS — M25552 Pain in left hip: Secondary | ICD-10-CM | POA: Diagnosis not present

## 2023-09-13 ENCOUNTER — Telehealth (HOSPITAL_BASED_OUTPATIENT_CLINIC_OR_DEPARTMENT_OTHER): Payer: Self-pay | Admitting: *Deleted

## 2023-09-13 ENCOUNTER — Telehealth: Payer: Self-pay | Admitting: *Deleted

## 2023-09-13 NOTE — Telephone Encounter (Signed)
   Pre-operative Risk Assessment    Patient Name: Michaela Kelley  DOB: 03-17-1941 MRN: [id number]   Date of last office visit: 05/29/2023 Date of next office visit: None  Request for Surgical Clearance    Procedure:  Left Total Hip arthroplasty  Date of Surgery:  Clearance 09/27/23                                 Surgeon:  Dr.Frank Aluisio Surgeon's Group or Practice Name:  [facility] Phone number:  [phone] Fax number:  [phone]   Type of Clearance Requested:   - Medical  - Pharmacy:  Hold Aspirin  Not Indicated   Type of Anesthesia:  Choice    Additional requests/questions:    Signed, Lauris Port   09/13/2023, 1:59 PM

## 2023-09-13 NOTE — Telephone Encounter (Signed)
 Pt has been scheduled tele preop appt 09/20/23. Med rec and consent are done.

## 2023-09-13 NOTE — Telephone Encounter (Signed)
Patient returned Pre-op call. 

## 2023-09-13 NOTE — Telephone Encounter (Signed)
 Pt has been scheduled tele preop appt 09/20/23. Med rec and consent are done.      Patient Consent for Virtual Visit        Michaela Kelley has provided verbal consent on 09/13/2023 for a virtual visit (video or telephone).   CONSENT FOR VIRTUAL VISIT FOR:  Michaela Kelley  By participating in this virtual visit I agree to the following:  I hereby voluntarily request, consent and authorize [facility] and its employed or contracted physicians, physician assistants, nurse practitioners or other licensed health care professionals (the Practitioner), to provide me with telemedicine health care services (the "Services) as deemed necessary by the treating Practitioner. I acknowledge and consent to receive the Services by the Practitioner via telemedicine. I understand that the telemedicine visit will involve communicating with the Practitioner through live audiovisual communication technology and the disclosure of certain medical information by electronic transmission. I acknowledge that I have been given the opportunity to request an in-person assessment or other available alternative prior to the telemedicine visit and am voluntarily participating in the telemedicine visit.  I understand that I have the right to withhold or withdraw my consent to the use of telemedicine in the course of my care at any time, without affecting my right to future care or treatment, and that the Practitioner or I may terminate the telemedicine visit at any time. I understand that I have the right to inspect all information obtained and/or recorded in the course of the telemedicine visit and may receive copies of available information for a reasonable fee.  I understand that some of the potential risks of receiving the Services via telemedicine include:  Delay or interruption in medical evaluation due to technological equipment failure or disruption; Information transmitted may not be sufficient (e.g. poor resolution  of images) to allow for appropriate medical decision making by the Practitioner; and/or  In rare instances, security protocols could fail, causing a breach of personal health information.  Furthermore, I acknowledge that it is my responsibility to provide information about my medical history, conditions and care that is complete and accurate to the best of my ability. I acknowledge that Practitioner's advice, recommendations, and/or decision may be based on factors not within their control, such as incomplete or inaccurate data provided by me or distortions of diagnostic images or specimens that may result from electronic transmissions. I understand that the practice of medicine is not an exact science and that Practitioner makes no warranties or guarantees regarding treatment outcomes. I acknowledge that a copy of this consent can be made available to me via my patient portal [facility] MyChart), or I can request a printed copy by calling the office of [facility].    I understand that my insurance will be billed for this visit.   I have read or had this consent read to me. I understand the contents of this consent, which adequately explains the benefits and risks of the Services being provided via telemedicine.  I have been provided ample opportunity to ask questions regarding this consent and the Services and have had my questions answered to my satisfaction. I give my informed consent for the services to be provided through the use of telemedicine in my medical care

## 2023-09-13 NOTE — Telephone Encounter (Signed)
   Name: Michaela Kelley  DOB: 03-27-1941  MRN: [id number]  Primary Cardiologist: None   Preoperative team, please contact this patient and set up a phone call appointment for further preoperative risk assessment. Please obtain consent and complete medication review. Thank you for your help.  I confirm that guidance regarding antiplatelet and oral anticoagulation therapy has been completed and, if necessary, noted below.  She may hold ASA.  I also confirmed the patient resides in the state of [place] . As per [facility] Medical Board telemedicine laws, the patient must reside in the state in which the provider is licensed.   Lamond Pilot, PA 09/13/2023, 2:35 PM [facility]

## 2023-09-13 NOTE — Telephone Encounter (Signed)
 Left message to call back to schedule tele pre op appt.

## 2023-09-14 NOTE — Progress Notes (Addendum)
 COVID Vaccine received:  []  No [x]  Yes Date of any COVID positive Test in last 90 days:   None   PCP - Lynwood Rush, MD  Boby Mackintosh, NP  Cardiologist - K. [place] Hilty, MD   Lipid clinic   Chest x-ray - 09-20-2022  2v Epic EKG -  04-21-2021  Epic Stress Test - n/a ECHO - n/a Cardiac Cath - n/a CT Cardiac calcium  score- (49.2)  12-14-2021  Epic   Pacemaker/ICD device     [x]  N/A Spinal Cord Stimulator:[x]  No []  Yes        History of Sleep Apnea? [x]  No []  Yes   CPAP used?- [x]  No []  Yes     Does the patient monitor blood sugar? []  No []  Yes  [x]  N/A Comments: History has Pre-DM listed but for the last 3 years her Hgb A1Cs have been normal. On  11-24-2022, it was 5.3. Patient she could not remember being told she had Pre-DM diagnosis.    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   Patient verbalized understanding and agreement to the Pre-Surgical Instructions that were given to them at this PAT appointment. Patient was also educated of the need to review these PAT instructions again prior to her surgery.I reviewed the appropriate phone numbers to call if they have any and questions or concerns.

## 2023-09-14 NOTE — Patient Instructions (Addendum)
 SURGICAL WAITING ROOM VISITATION Patients having surgery or a procedure may have no more than 2 support people in the waiting area - these visitors may rotate in the visitor waiting room.   If the patient needs to stay at the hospital during part of their recovery, the visitor guidelines for inpatient rooms apply.  PRE-OP VISITATION  Pre-op nurse will coordinate an appropriate time for 1 support person to accompany the patient in pre-op.  This support person may not rotate.  This visitor will be contacted when the time is appropriate for the visitor to come back in the pre-op area.  Please refer to the [facility] website for the visitor guidelines for Inpatients (after your surgery is over and you are in a regular room).  You are not required to quarantine at this time prior to your surgery. However, you must do this: Hand Hygiene often Do NOT share personal items Notify your provider if you are in close contact with someone who has COVID or you develop fever 100.4 or greater, new onset of sneezing, cough, sore throat, shortness of breath or body aches.  If you test positive for Covid or have been in contact with anyone that has tested positive in the last 10 days please notify you surgeon.    Your procedure is scheduled on:  Wednesday  September 27, 2023  Report to [facility] Main Entrance: [address] entrance where the [employer] is available.   Report to admitting at:  12:30  PM  Call this number if you have any questions or problems the morning of surgery [phone]  Do not eat food after Midnight the night prior to your surgery/procedure.  After Midnight you may have the following liquids until  12:00 noon the DAY OF SURGERY  Clear Liquid Diet Water  Black Coffee (sugar ok, NO MILK/CREAM OR CREAMERS)  Tea (sugar ok, NO MILK/CREAM OR CREAMERS) regular and decaf                             Plain Jell-O  with no fruit (NO RED)                                           Fruit  ices (not with fruit pulp, NO RED)                                     Popsicles (NO RED)                                                                  Juice: NO CITRUS JUICES: only apple, WHITE grape, WHITE cranberry Sports drinks like Gatorade or Powerade (NO RED)                   The day of surgery:  Drink ONE (1) Pre-Surgery G2 at   12:00  noon the morning of surgery. Drink in one sitting. Do not sip.  This drink was given to you during your hospital pre-op appointment visit. Nothing else to drink after  completing the Pre-Surgery  G2 : No candy, chewing gum or throat lozenges.    FOLLOW ANY ADDITIONAL PRE OP INSTRUCTIONS YOU RECEIVED FROM YOUR SURGEON'S OFFICE!!!   Oral Hygiene is also important to reduce your risk of infection.        Remember - BRUSH YOUR TEETH THE MORNING OF SURGERY WITH YOUR REGULAR TOOTHPASTE  Do NOT smoke after Midnight the night before surgery.  STOP TAKING all Vitamins, Herbs and supplements 1 week before your surgery.   Take ONLY these medicines the morning of surgery with A SIP OF WATER : montelukast  and Tylenol  if needed. You may use your Trelegy Ellipta  and Albuterol  inhalers if needed. You may use your eye drops.    You may not have any metal on your body including hair pins, jewelry, and body piercing  Do not wear make-up, lotions, powders, perfumes  or deodorant  Do not wear nail polish including gel and S&S, artificial / acrylic nails, or any other type of covering on natural nails including finger and toenails. If you have artificial nails, gel coating, etc., that needs to be removed by a nail salon, Please have this removed prior to surgery. Not doing so may mean that your surgery could be cancelled or delayed if the Surgeon or anesthesia staff feels like they are unable to monitor you safely.   Do not shave 48 hours prior to surgery to avoid nicks in your skin which may contribute to postoperative infections.   Contacts, Hearing Aids,  dentures or bridgework may not be worn into surgery. DENTURES WILL BE REMOVED PRIOR TO SURGERY PLEASE DO NOT APPLY Poly grip OR ADHESIVES!!!  You may bring a small overnight bag with you on the day of surgery, only pack items that are not valuable. [facility] IS NOT RESPONSIBLE   FOR VALUABLES THAT ARE LOST OR STOLEN.   Do not bring your home medications to the hospital. The Pharmacy will dispense medications listed on your medication list to you during your admission in the Hospital.  Special Instructions: Bring a copy of your healthcare power of attorney and living will documents the day of surgery, if you wish to have them scanned into your [facility]- EPIC  Please read over the following fact sheets you were given: IF YOU HAVE QUESTIONS ABOUT YOUR PRE-OP INSTRUCTIONS, PLEASE CALL [phone].     Pre-operative 5 CHG Bath Instructions   You can play a key role in reducing the risk of infection after surgery. Your skin needs to be as free of germs as possible. You can reduce the number of germs on your skin by washing with CHG (chlorhexidine  gluconate) soap before surgery. CHG is an antiseptic soap that kills germs and continues to kill germs even after washing.   DO NOT use if you have an allergy to chlorhexidine /CHG or antibacterial soaps. If your skin becomes reddened or irritated, stop using the CHG and notify one of our RNs at [phone]  Please shower with the CHG soap starting 4 days before surgery using the following schedule: START SHOWERS ON Saturday   September 23, 2023  Please keep in mind the following:  DO NOT shave, including legs and underarms, starting the day of your first shower.   You may shave your face at any point before/day of surgery.   Place clean sheets on your bed the day  you start using CHG soap. Use a clean washcloth (not used since being washed) for each shower. DO NOT sleep with pets once you start using the CHG.   CHG Shower Instructions:  If you choose to wash your hair and private area, wash first with your normal shampoo/soap.  After you use shampoo/soap, rinse your hair and body thoroughly to remove shampoo/soap residue.  Turn the water  OFF and apply about 3 tablespoons (45 ml) of CHG soap to a CLEAN washcloth.  Apply CHG soap ONLY FROM YOUR NECK DOWN TO YOUR TOES (washing for 3-5 minutes)  DO NOT use CHG soap on face, private areas, open wounds, or sores.  Pay special attention to the area where your surgery is being performed.  If you are having back surgery, having someone wash your back for you may be helpful.  Wait 2 minutes after CHG soap is applied, then you may rinse off the CHG soap.  Pat dry with a clean towel  Put on clean clothes/pajamas   If you choose to wear lotion, please use ONLY the CHG-compatible lotions on the back of this paper.     Additional instructions for the day of surgery: DO NOT APPLY any lotions, deodorants, cologne, or perfumes.   Put on clean/comfortable clothes.  Brush your teeth.  Ask your nurse before applying any prescription medications to the skin.      CHG Compatible Lotions   Aveeno Moisturizing lotion  Cetaphil Moisturizing Cream  Cetaphil Moisturizing Lotion  Clairol Herbal Essence Moisturizing Lotion, Dry Skin  Clairol Herbal Essence Moisturizing Lotion, Extra Dry Skin  Clairol Herbal Essence Moisturizing Lotion, Normal Skin  Curel Age Defying Therapeutic Moisturizing Lotion with Alpha Hydroxy  Curel Extreme Care Body Lotion  Curel Soothing Hands Moisturizing Hand Lotion  Curel Therapeutic Moisturizing Cream, Fragrance-Free  Curel Therapeutic Moisturizing Lotion, Fragrance-Free  Curel Therapeutic Moisturizing Lotion, Original Formula  Eucerin Daily Replenishing Lotion  Eucerin Dry Skin  Therapy Plus Alpha Hydroxy Crme  Eucerin Dry Skin Therapy Plus Alpha Hydroxy Lotion  Eucerin Original Crme  Eucerin Original Lotion  Eucerin Plus Crme Eucerin Plus Lotion  Eucerin TriLipid Replenishing Lotion  Keri Anti-Bacterial Hand Lotion  Keri Deep Conditioning Original Lotion Dry Skin Formula Softly Scented  Keri Deep Conditioning Original Lotion, Fragrance Free Sensitive Skin Formula  Keri Lotion Fast Absorbing Fragrance Free Sensitive Skin Formula  Keri Lotion Fast Absorbing Softly Scented Dry Skin Formula  Keri Original Lotion  Keri Skin Renewal Lotion Keri Silky Smooth Lotion  Keri Silky Smooth Sensitive Skin Lotion  Nivea Body Creamy Conditioning Oil  Nivea Body Extra Enriched Lotion  Nivea Body Original Lotion  Nivea Body Sheer Moisturizing Lotion Nivea Crme  Nivea Skin Firming Lotion  NutraDerm 30 Skin Lotion  NutraDerm Skin Lotion  NutraDerm Therapeutic Skin Cream  NutraDerm Therapeutic Skin Lotion  ProShield Protective Hand Cream  Provon moisturizing lotion   FAILURE TO FOLLOW THESE INSTRUCTIONS MAY RESULT IN THE CANCELLATION OF YOUR SURGERY  PATIENT SIGNATURE_________________________________  NURSE SIGNATURE__________________________________  ________________________________________________________________________         Michaela Kelley    An incentive spirometer is a tool that can help keep your lungs clear and active. This tool measures how well you are filling your lungs with each  breath. Taking long deep breaths may help reverse or decrease the chance of developing breathing (pulmonary) problems (especially infection) following: A long period of time when you are unable to move or be active. BEFORE THE PROCEDURE  If the spirometer includes an indicator to show your best effort, your nurse or respiratory therapist will set it to a desired goal. If possible, sit up straight or lean slightly forward. Try not to slouch. Hold the incentive  spirometer in an upright position. INSTRUCTIONS FOR USE  Sit on the edge of your bed if possible, or sit up as far as you can in bed or on a chair. Hold the incentive spirometer in an upright position. Breathe out normally. Place the mouthpiece in your mouth and seal your lips tightly around it. Breathe in slowly and as deeply as possible, raising the piston or the ball toward the top of the column. Hold your breath for 3-5 seconds or for as long as possible. Allow the piston or ball to fall to the bottom of the column. Remove the mouthpiece from your mouth and breathe out normally. Rest for a few seconds and repeat Steps 1 through 7 at least 10 times every 1-2 hours when you are awake. Take your time and take a few normal breaths between deep breaths. The spirometer may include an indicator to show your best effort. Use the indicator as a goal to work toward during each repetition. After each set of 10 deep breaths, practice coughing to be sure your lungs are clear. If you have an incision (the cut made at the time of surgery), support your incision when coughing by placing a pillow or rolled up towels firmly against it. Once you are able to get out of bed, walk around indoors and cough well. You may stop using the incentive spirometer when instructed by your caregiver.  RISKS AND COMPLICATIONS Take your time so you do not get dizzy or light-headed. If you are in pain, you may need to take or ask for pain medication before doing incentive spirometry. It is harder to take a deep breath if you are having pain. AFTER USE Rest and breathe slowly and easily. It can be helpful to keep track of a log of your progress. Your caregiver can provide you with a simple table to help with this. If you are using the spirometer at home, follow these instructions: SEEK MEDICAL CARE IF:  You are having difficultly using the spirometer. You have trouble using the spirometer as often as instructed. Your pain  medication is not giving enough relief while using the spirometer. You develop fever of 100.5 F (38.1 C) or higher.                                                                                                    SEEK IMMEDIATE MEDICAL CARE IF:  You cough up bloody sputum that had not been present before. You develop fever of 102 F (38.9 C) or greater. You develop worsening pain at or near the incision site. MAKE SURE YOU:  Understand these instructions. Will watch  your condition. Will get help right away if you are not doing well or get worse. Document Released: 07/25/2006 Document Revised: 06/06/2011 Document Reviewed: 09/25/2006 [facility] Patient Information 2014 [place], [place].                  If you would like to see a video about joint replacement:   [email or web address]

## 2023-09-15 ENCOUNTER — Encounter (HOSPITAL_COMMUNITY)
Admission: RE | Admit: 2023-09-15 | Discharge: 2023-09-15 | Disposition: A | Discharge status: Home / Self Care | Source: Ambulatory Visit | Attending: Orthopedic Surgery | Admitting: Orthopedic Surgery

## 2023-09-15 ENCOUNTER — Other Ambulatory Visit: Payer: Self-pay

## 2023-09-15 ENCOUNTER — Encounter (HOSPITAL_COMMUNITY): Payer: Self-pay

## 2023-09-15 VITALS — BP 165/68 | HR 72 | Temp 98.2°F | Resp 16 | Ht 62.0 in | Wt 140.0 lb

## 2023-09-15 DIAGNOSIS — N289 Disorder of kidney and ureter, unspecified: Secondary | ICD-10-CM | POA: Insufficient documentation

## 2023-09-15 DIAGNOSIS — I2510 Atherosclerotic heart disease of native coronary artery without angina pectoris: Secondary | ICD-10-CM | POA: Insufficient documentation

## 2023-09-15 DIAGNOSIS — Z01818 Encounter for other preprocedural examination: Secondary | ICD-10-CM | POA: Diagnosis not present

## 2023-09-15 DIAGNOSIS — E871 Hypo-osmolality and hyponatremia: Secondary | ICD-10-CM | POA: Diagnosis not present

## 2023-09-15 DIAGNOSIS — M1612 Unilateral primary osteoarthritis, left hip: Secondary | ICD-10-CM | POA: Insufficient documentation

## 2023-09-15 DIAGNOSIS — I10 Essential (primary) hypertension: Secondary | ICD-10-CM | POA: Diagnosis not present

## 2023-09-15 HISTORY — DX: Chronic kidney disease, unspecified: N18.9

## 2023-09-15 HISTORY — DX: Unspecified asthma, uncomplicated: J45.909

## 2023-09-15 LAB — CBC
HCT: 36.4 % (ref 36.0–46.0)
Hemoglobin: 11.9 g/dL — ABNORMAL LOW (ref 12.0–15.0)
MCH: 31.6 pg (ref 26.0–34.0)
MCHC: 32.7 g/dL (ref 30.0–36.0)
MCV: 96.6 fL (ref 80.0–100.0)
Platelets: 371 10*3/uL (ref 150–400)
RBC: 3.77 MIL/uL — ABNORMAL LOW (ref 3.87–5.11)
RDW: 13.2 % (ref 11.5–15.5)
WBC: 8.5 10*3/uL (ref 4.0–10.5)
nRBC: 0 % (ref 0.0–0.2)

## 2023-09-15 LAB — COMPREHENSIVE METABOLIC PANEL WITH GFR
ALT: 34 U/L (ref 0–44)
AST: 29 U/L (ref 15–41)
Albumin: 4.5 g/dL (ref 3.5–5.0)
Alkaline Phosphatase: 61 U/L (ref 38–126)
Anion gap: 11 (ref 5–15)
BUN: 20 mg/dL (ref 8–23)
CO2: 24 mmol/L (ref 22–32)
Calcium: 10.1 mg/dL (ref 8.9–10.3)
Chloride: 94 mmol/L — ABNORMAL LOW (ref 98–111)
Creatinine, Ser: 0.95 mg/dL (ref 0.44–1.00)
GFR, Estimated: 60 mL/min — ABNORMAL LOW (ref >60)
Glucose, Bld: 111 mg/dL — ABNORMAL HIGH (ref 70–99)
Potassium: 4.2 mmol/L (ref 3.5–5.1)
Sodium: 129 mmol/L — ABNORMAL LOW (ref 135–145)
Total Bilirubin: 0.6 mg/dL (ref 0.0–1.2)
Total Protein: 7.6 g/dL (ref 6.5–8.1)

## 2023-09-15 LAB — TYPE AND SCREEN
ABO/RH(D): O POS
Antibody Screen: NEGATIVE

## 2023-09-15 LAB — SURGICAL PCR SCREEN
MRSA, PCR: NEGATIVE
Staphylococcus aureus: NEGATIVE

## 2023-09-20 ENCOUNTER — Ambulatory Visit

## 2023-09-20 ENCOUNTER — Encounter: Payer: Self-pay | Admitting: Family Medicine

## 2023-09-20 ENCOUNTER — Ambulatory Visit: Admitting: Family Medicine

## 2023-09-20 ENCOUNTER — Ambulatory Visit: Attending: Cardiology

## 2023-09-20 VITALS — BP 136/78 | HR 78 | Temp 97.6°F | Ht 64.0 in | Wt 141.0 lb

## 2023-09-20 VITALS — BP 140/72 | HR 78 | Ht 64.0 in | Wt 141.8 lb

## 2023-09-20 DIAGNOSIS — Z0000 Encounter for general adult medical examination without abnormal findings: Secondary | ICD-10-CM | POA: Diagnosis not present

## 2023-09-20 DIAGNOSIS — I10 Essential (primary) hypertension: Secondary | ICD-10-CM

## 2023-09-20 DIAGNOSIS — E559 Vitamin D deficiency, unspecified: Secondary | ICD-10-CM | POA: Diagnosis not present

## 2023-09-20 DIAGNOSIS — E538 Deficiency of other specified B group vitamins: Secondary | ICD-10-CM | POA: Diagnosis not present

## 2023-09-20 DIAGNOSIS — Z01810 Encounter for preprocedural cardiovascular examination: Secondary | ICD-10-CM

## 2023-09-20 DIAGNOSIS — E871 Hypo-osmolality and hyponatremia: Secondary | ICD-10-CM

## 2023-09-20 DIAGNOSIS — Z0001 Encounter for general adult medical examination with abnormal findings: Secondary | ICD-10-CM

## 2023-09-20 DIAGNOSIS — N183 Chronic kidney disease, stage 3 unspecified: Secondary | ICD-10-CM | POA: Diagnosis not present

## 2023-09-20 DIAGNOSIS — E782 Mixed hyperlipidemia: Secondary | ICD-10-CM

## 2023-09-20 DIAGNOSIS — J4520 Mild intermittent asthma, uncomplicated: Secondary | ICD-10-CM

## 2023-09-20 DIAGNOSIS — J3089 Other allergic rhinitis: Secondary | ICD-10-CM

## 2023-09-20 DIAGNOSIS — Z23 Encounter for immunization: Secondary | ICD-10-CM

## 2023-09-20 DIAGNOSIS — M810 Age-related osteoporosis without current pathological fracture: Secondary | ICD-10-CM | POA: Diagnosis not present

## 2023-09-20 MED ORDER — OLMESARTAN MEDOXOMIL 40 MG PO TABS: ORAL_TABLET | ORAL | 1 refills | Status: DC

## 2023-09-20 NOTE — Patient Instructions (Signed)
 Michaela Kelley , Thank you for taking time out of your busy schedule to complete your Annual Wellness Visit with me. I enjoyed our conversation and look forward to speaking with you again next year. I, as well as your care team,  appreciate your ongoing commitment to your health goals. Please review the following plan we discussed and let me know if I can assist you in the future. Your Game plan/ To Do List    Follow up Visits: Next Medicare AWV with our clinical staff: 09/20/2024   Have you seen your provider in the last 6 months (3 months if uncontrolled diabetes)? Yes Next Office Visit with your provider: 09/20/2023  Clinician Recommendations:  Aim for 30 minutes of exercise or brisk walking, 6-8 glasses of water , and 5 servings of fruits and vegetables each day. Educated and advised on getting the Tdap (Tetenus), Shingles, and COVID vaccines in 2025.      This is a list of the screening recommended for you and due dates:  Health Maintenance  Topic Date Due   Zoster (Shingles) Vaccine (1 of 2) Never done   DTaP/Tdap/Td vaccine (2 - Tdap) 05/26/2021   COVID-19 Vaccine (4 - 2024-25 season) 11/27/2022   Flu Shot  10/27/2023   Medicare Annual Wellness Visit  09/19/2024   Pneumococcal Vaccine for age over 3  Completed   DEXA scan (bone density measurement)  Completed   Hepatitis B Vaccine  Aged Out   HPV Vaccine  Aged Out   Meningitis B Vaccine  Aged Out    Advanced directives: (In Chart) A copy of your advanced directives are scanned into your chart should your provider ever need it. Advance Care Planning is important because it:  [x]  Makes sure you receive the medical care that is consistent with your values, goals, and preferences  [x]  It provides guidance to your family and loved ones and reduces their decisional burden about whether or not they are making the right decisions based on your wishes.  Follow the link provided in your after visit summary or read over the paperwork we have  mailed to you to help you started getting your Advance Directives in place. If you need assistance in completing these, please reach out to us  so that we can help you!

## 2023-09-20 NOTE — Patient Instructions (Addendum)
 Please check with Dr. Posey office regarding your Tdap vaccine (tetanus, diphtheria and pertussis).  Good luck with your surgery.  Please get your bone density study done but at this point, if you are unable to get it done before your surgery, I recommend holding off until your surgery is done and you are healed.

## 2023-09-20 NOTE — Progress Notes (Signed)
 Complete physical exam  Patient: Michaela Kelley   DOB: 28-Jul-1940   83 y.o. Female  MRN: [id number]  Subjective:    Chief Complaint  Patient presents with   Annual Exam   She is here for a complete physical exam.  States she is having left hip replacement next week with Dr. Terrace  Currently using a cane to ambulate due to hip pain.  She is very active, plays golf and works in her garden.  Recently due to her hip her activities have been limited  She wears hearing aids.  They are working well for her. Sees her dentist regularly.   Takes ASA 81 mg  Taking Celbrex some days for joint pain.    Takes magnesium     Allergies and takes Singulair .    Asthma- takes Trelegy and albuterol  prn    LLD 117 -sees Dr. Mona    HTN- taking olmesartan  and denies any side effects.   Osteoporosis - DEXA 2021  T- -3.6 in forearms  Is not on treatment   One of her daughters recently moved in with her.   Health Maintenance  Topic Date Due   Zoster (Shingles) Vaccine (1 of 2) Never done   DTaP/Tdap/Td vaccine (2 - Tdap) 05/26/2021   COVID-19 Vaccine (4 - 2024-25 season) 10/06/2023*   Flu Shot  10/27/2023   Medicare Annual Wellness Visit  09/19/2024   Pneumococcal Vaccine for age over 31  Completed   DEXA scan (bone density measurement)  Completed   Hepatitis B Vaccine  Aged Out   HPV Vaccine  Aged Out   Meningitis B Vaccine  Aged Out  *Topic was postponed. The date shown is not the original due date.    Wears seatbelt always, uses sunscreen, smoke detectors in home and functioning, does not text while driving, feels safe in home environment.  Depression screening:    09/20/2023    1:24 PM 09/01/2023    2:03 PM 06/22/2023    8:38 AM  Depression screen PHQ 2/9  Decreased Interest 0 0 0  Down, Depressed, Hopeless 0 0 0  PHQ - 2 Score 0 0 0  Altered sleeping 0    Tired, decreased energy 0    Change in appetite 0    Feeling bad or failure about yourself  0    Trouble  concentrating 0    Moving slowly or fidgety/restless 3    Suicidal thoughts 0    PHQ-9 Score 3    Difficult doing work/chores Not difficult at all     Anxiety Screening:     No data to display          Vision:Within last year and Dental: No current dental problems and Receives regular dental care  Patient Active Problem List   Diagnosis Date Noted   Cerumen impaction 09/01/2023   Bilateral hearing loss 09/01/2023   Chronic hyponatremia 06/22/2023   Anemia 06/22/2023   Mild intermittent asthma without complication 06/22/2023   Fatigue 06/22/2023   Environmental and seasonal allergies 06/22/2023   Osteoarthritis of right hip 01/17/2022   History of colon polyps 10/01/2021   Asymptomatic varicose veins of left lower extremity 10/01/2021   B12 deficiency 02/12/2020   Diverticulosis 01/15/2020   Pelvic congestion syndrome 01/15/2020   Osteoarthritis of right knee 07/03/2019   Osteoporosis 02/01/2018   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   Seasonal allergic rhinitis    Vitamin D  deficiency    Hyperlipidemia, mixed 01/02/2008   Essential hypertension 01/02/2008  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   Cataract    Chronic kidney disease    CKD3   Ganglion, left wrist 05/11/2023   GERD (gastroesophageal reflux disease)    Hx: UTI (urinary tract infection)    Hyperlipidemia    Hypertension    Pain in right hip 09/16/2021   Pain in right knee 06/27/2022   Pain of left hand 04/26/2023   Pain of left hip joint 04/26/2023   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   Prediabetes    Seasonal allergic rhinitis    Vitamin D  deficiency    Past Surgical History:  Procedure Laterality Date   BACK SURGERY  1999   L5    DILATION AND CURETTAGE OF UTERUS     KNEE ARTHROPLASTY  2010   L knee   TONSILLECTOMY     TOTAL HIP ARTHROPLASTY Right  01/17/2022   Procedure: TOTAL HIP ARTHROPLASTY ANTERIOR APPROACH;  Surgeon: Melodi Lerner, MD;  Location: [facility];  Service: Orthopedics;  Laterality: Right;   TOTAL SHOULDER ARTHROPLASTY  02/10/2011   Procedure: TOTAL SHOULDER ARTHROPLASTY;  Surgeon: Franky HERO Supple;  Location: [facility];  Service: Orthopedics;  Laterality: Left;  TOTAL SHOULDER ARTHROPLASTY LEFT SIDE   TOTAL SHOULDER ARTHROPLASTY  12/22/2011   Procedure: TOTAL SHOULDER ARTHROPLASTY;  Surgeon: Franky HERO Pointer, MD;  Location: [facility];  Service: Orthopedics;  Laterality: Right;  right total shoulder arthroplasty   Social History   Tobacco Use   Smoking status: Former    Current packs/day: 0.00    Average packs/day: 0.3 packs/day for 20.0 years (5.0 ttl pk-yrs)    Types: Cigarettes    Start date: 12/16/1971    Quit date: 12/16/1991    Years since quitting: 31.7   Smokeless tobacco: Never  Vaping Use   Vaping status: Never Used  Substance Use Topics   Alcohol  use: Yes    Alcohol /week: 8.0 standard drinks of alcohol     Types: 8 Glasses of wine per week    Comment: 6-8 glasses per week    Drug use: No      Patient Care Team: Lendia Boby CROME, NP-C as PCP - General (Family Medicine) Quinn Odor, [facility] (Inactive) as Pharmacist (Pharmacist) Leslee Reusing, MD as Consulting Physician (Ophthalmology) Melodi Lerner, MD as Consulting Physician (Orthopedic Surgery)   Outpatient Medications Prior to Visit  Medication Sig   acetaminophen  (TYLENOL ) 650 MG CR tablet Take 1,300 mg by mouth every 8 (eight) hours as needed for pain.   albuterol  (VENTOLIN  HFA) 108 (90 Base) MCG/ACT inhaler Use 2 inhalations  15 minutes Apart  every 4 hours  to Rescue Asthma   aspirin  EC 81 MG tablet Take 81 mg by mouth daily.   b complex vitamins capsule Take 1 capsule by mouth daily.   carboxymethylcellulose (REFRESH TEARS) 0.5 % SOLN Place 1 drop into both eyes 3 (three) times daily as needed (dry eyes).   celecoxib (CELEBREX) 200 MG capsule Take  200 mg by mouth daily as needed for moderate pain (pain score 4-6).   Cholecalciferol  (VITAMIN D ) 50 MCG (2000 UT) tablet Take 2,000 Units by mouth daily.   cyanocobalamin  (VITAMIN B12) 500 MCG tablet Take 500 mcg by mouth daily.   MAGNESIUM  PO Take 500 mg by mouth daily.   montelukast  (SINGULAIR ) 10 MG tablet TAKE ONE  TABLET DAILY FOR ALLERGY   TRELEGY ELLIPTA  200-62.5-25 MCG/ACT AEPB INHALE ONE INHALATION INTO LUNGS DAILY   TURMERIC CURCUMIN PO Take 750 mg by mouth daily.   [DISCONTINUED] olmesartan  (BENICAR ) 40 MG tablet TAKE ONE TABLET DAILY FOR BLOOD PRESSURE   No facility-administered medications prior to visit.    Review of Systems  Constitutional:  Negative for chills, fever, malaise/fatigue and weight loss.  HENT:  Negative for congestion, ear pain, sinus pain and sore throat.   Eyes:  Negative for blurred vision, double vision and pain.  Respiratory:  Negative for cough, shortness of breath and wheezing.   Cardiovascular:  Negative for chest pain, palpitations and leg swelling.  Gastrointestinal:  Negative for abdominal pain, constipation, diarrhea, nausea and vomiting.  Genitourinary:  Negative for dysuria, frequency and urgency.  Musculoskeletal:  Positive for joint pain. Negative for back pain, falls and myalgias.       Hip pain  Skin:  Negative for rash.  Neurological:  Negative for dizziness, tingling, focal weakness and headaches.  Endo/Heme/Allergies:  Bruises/bleeds easily.  Psychiatric/Behavioral:  Negative for depression, memory loss and suicidal ideas. The patient is not nervous/anxious.        Objective:    BP 136/78 (BP Location: Left Arm, Patient Position: Sitting)   Pulse 78   Temp 97.6 F (36.4 C) (Temporal)   Ht 5' 4 (1.626 m)   Wt 141 lb (64 kg)   SpO2 96%   BMI 24.20 kg/m  BP Readings from Last 3 Encounters:  09/20/23 136/78  09/20/23 (!) 140/72  09/15/23 (!) 165/68   Wt Readings from Last 3 Encounters:  09/20/23 141 lb (64 kg)  09/20/23  141 lb 12.8 oz (64.3 kg)  09/15/23 140 lb (63.5 kg)    Physical Exam Constitutional:      General: She is not in acute distress.    Appearance: She is not ill-appearing.  HENT:     Right Ear: Tympanic membrane, ear canal and external ear normal.     Left Ear: Tympanic membrane, ear canal and external ear normal.     Nose: Nose normal.     Mouth/Throat:     Mouth: Mucous membranes are moist.     Pharynx: Oropharynx is clear.   Eyes:     Extraocular Movements: Extraocular movements intact.     Conjunctiva/sclera: Conjunctivae normal.     Pupils: Pupils are equal, round, and reactive to light.   Neck:     Thyroid : No thyroid  mass, thyromegaly or thyroid  tenderness.   Cardiovascular:     Rate and Rhythm: Normal rate and regular rhythm.     Pulses: Normal pulses.     Heart sounds: Normal heart sounds.  Pulmonary:     Effort: Pulmonary effort is normal.     Breath sounds: Normal breath sounds.  Abdominal:     General: Bowel sounds are normal.     Palpations: Abdomen is soft.     Tenderness: There is no abdominal tenderness. There is no right CVA tenderness, left CVA tenderness, guarding or rebound.   Musculoskeletal:        General: Normal range of motion.     Cervical back: Normal range of motion and neck supple. No tenderness.     Right lower leg: No edema.     Left lower leg: No edema.  Lymphadenopathy:     Cervical: No cervical adenopathy.   Skin:    General: Skin is warm and dry.     Findings: No lesion  or rash.   Neurological:     General: No focal deficit present.     Mental Status: She is alert and oriented to person, place, and time.     Cranial Nerves: No cranial nerve deficit.     Sensory: No sensory deficit.     Motor: No weakness.     Gait: Gait abnormal.     Comments: Hip pain  Psychiatric:        Mood and Affect: Mood normal.        Behavior: Behavior normal.        Thought Content: Thought content normal.      No results found for any visits on  09/20/23.    Assessment & Plan:    Routine Health Maintenance and Physical Exam  Problem List Items Addressed This Visit     B12 deficiency   Chronic hyponatremia   Environmental and seasonal allergies   Essential hypertension   Relevant Medications   olmesartan  (BENICAR ) 40 MG tablet   Hyperlipidemia, mixed   Relevant Medications   olmesartan  (BENICAR ) 40 MG tablet   Mild intermittent asthma without complication   Osteoporosis   Vitamin D  deficiency   Other Visit Diagnoses       Encounter for general adult medical examination with abnormal findings    -  Primary     Stage 3 chronic kidney disease, unspecified whether stage 3a or 3b CKD (HCC)         Need for Tdap vaccination          Reviewed notes from specialists as well as recent lab results. Preventive health care reviewed.  Counseling on healthy lifestyle including diet and exercise.  Recommend regular dental and eye exams.  Immunizations reviewed.  Discussed safety.  Blood pressure controlled.  Continue olmesartan  and low-sodium diet. Chronic hyponatremia-continue to monitor Asthma well-controlled.  Continue to treat allergies as needed Vitamin D  deficiency-continue vitamin D  supplement Osteoporosis-due for bone density.  This was ordered.  Will discuss treatment once I have her results. Upcoming surgery-form filled out.  I cleared her from a medical standpoint.  She states her cardiologist has already cleared her from a cardiac standpoint. Advised her that she is overdue for Tdap and she should get this at her local pharmacy.  Return for schedule D[facility]A at [facility]. .     Boby Mackintosh, NP-C

## 2023-09-20 NOTE — Progress Notes (Signed)
 Virtual Visit via Telephone Note   Because of Michaela Kelley co-morbid illnesses, she is at least at moderate risk for complications without adequate follow up.  This format is felt to be most appropriate for this patient at this time.  Due to technical limitations with video connection (technology), today's appointment will be conducted as an audio only telehealth visit, and Michaela Kelley verbally agreed to proceed in this manner.   All issues noted in this document were discussed and addressed.  No physical exam could be performed with this format.  Evaluation Performed:  Preoperative cardiovascular risk assessment _____________   Date:  09/20/2023   Patient ID:  Michaela Kelley, DOB Nov 08, 1940, MRN [id number] Patient Location:  Home Provider location:   Office  Primary Care Provider:  Lendia Boby CROME, NP-C Primary Cardiologist:  None  Chief Complaint / Patient Profile  83 y.o. 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 History of Present Illness  Michaela Kelley is a 83 y.o. female who presents via audio/video conferencing for a telehealth visit today.  Pt was last seen in cardiology clinic on 06/25/2023 by Dr. Mona for evaluation of hyperlipidemia in the lipid clinic.  At that time Michaela Kelley was doing well.  The patient is now pending procedure as outlined above. Since her last visit, she has remained stable from a cardiac standpoint.  Patient reports that prior to recently injuring her hip that she was attending aerobics classes at least twice a week and regularly golfing throughout the week, notes that her mobility has been somewhat limited following her hip injury.  However even with decreased mobility she is still able to achieve greater than 4 METS of activity. Today she denies chest pain, shortness of  breath, lower extremity edema, fatigue, palpitations, melena, hematuria, hemoptysis, diaphoresis, weakness, presyncope, syncope, orthopnea, and PND.  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   Prediabetes    Seasonal allergic rhinitis    Vitamin D  deficiency    Past Surgical History:  Procedure Laterality Date   BACK SURGERY  1999   L5    DILATION AND CURETTAGE OF UTERUS     KNEE ARTHROPLASTY  2010   Kelley knee   TONSILLECTOMY     TOTAL HIP ARTHROPLASTY Right 01/17/2022   Procedure: TOTAL HIP ARTHROPLASTY ANTERIOR APPROACH;  Surgeon: Michaela Lerner, MD;  Location: [facility];  Service: Orthopedics;  Laterality: Right;   TOTAL SHOULDER ARTHROPLASTY  02/10/2011   Procedure: TOTAL SHOULDER ARTHROPLASTY;  Surgeon: Michaela Kelley;  Location: [facility];  Service: Orthopedics;  Laterality: Left;  TOTAL SHOULDER ARTHROPLASTY LEFT SIDE   TOTAL SHOULDER ARTHROPLASTY  12/22/2011   Procedure: TOTAL SHOULDER ARTHROPLASTY;  Surgeon: Michaela CHRISTELLA Pointer, MD;  Location: [facility] OR;  Service: Orthopedics;  Laterality: Right;  right total shoulder arthroplasty   Allergies Allergies  Allergen Reactions   Atorvastatin      Myalgias    Codeine Nausea And Vomiting and Other (See Comments)    Severe headaches (tolerates Dilaudid )    Fosamax  [Alendronate ] Other (See Comments)    Severe arthrlalgias   Hydrocodone -Acetaminophen  Nausea And Vomiting and Other (See Comments)    Severe headaches (tolerates  Dilaudid )   Oxycodone -Acetaminophen  Nausea And Vomiting and Other (See Comments)    Severe headaches (tolerates Dilaudid )   Voltaren [Diclofenac] Rash    Gel   Home Medications    Prior to Admission medications   Medication Sig Start Date End Date Taking? Authorizing Provider  acetaminophen  (TYLENOL ) 650 MG CR tablet Take 1,300 mg by mouth every 8 (eight) hours as needed for pain.    [provider]  albuterol  (VENTOLIN  HFA) 108 (90 Base) MCG/ACT inhaler Use 2 inhalations  15 minutes Apart  every 4 hours  to Rescue Asthma 03/19/22   Michaela Fallow, MD  aspirin  EC 81 MG tablet Take 81 mg by mouth daily.    [provider]  b complex vitamins capsule Take 1 capsule by mouth daily.    [provider]  carboxymethylcellulose (REFRESH TEARS) 0.5 % SOLN Place 1 drop into both eyes 3 (three) times daily as needed (dry eyes).    [provider]  celecoxib (CELEBREX) 200 MG capsule Take 200 mg by mouth daily as needed for moderate pain (pain score 4-6). 07/18/23   [provider]  Cholecalciferol  (VITAMIN D ) 50 MCG (2000 UT) tablet Take 2,000 Units by mouth daily.    [provider]  cyanocobalamin  (VITAMIN B12) 500 MCG tablet Take 500 mcg by mouth daily.    [provider]  MAGNESIUM  PO Take 500 mg by mouth daily.    [provider]  montelukast  (SINGULAIR ) 10 MG tablet TAKE ONE TABLET DAILY FOR ALLERGY 06/22/23   Henson, Michaela L, NP-C  olmesartan  (BENICAR ) 40 MG tablet TAKE ONE TABLET DAILY FOR BLOOD PRESSURE 03/23/23   Wilkinson, Michaela E, FNP  TRELEGY ELLIPTA  200-62.5-25 MCG/ACT AEPB INHALE ONE INHALATION INTO LUNGS DAILY Patient taking differently: Inhale 1 puff into the lungs daily as needed (shortness of breath). 03/01/23   Cranford, Tonya, NP  TURMERIC CURCUMIN PO Take 750 mg by mouth daily.    [provider]    Physical Exam  Vital Signs:  Michaela SERMENO does not have vital signs available for review  today. Given telephonic nature of communication, physical exam is limited. AAOx3. NAD. Normal affect.  Speech and respirations are unlabored. Accessory Clinical Findings  None Assessment & Plan    1.  Preoperative Cardiovascular Risk Assessment: Ms. Raby's perioperative risk of a major cardiac event is 0.4% according to the Revised Cardiac Risk Index (RCRI).  Therefore, she is at low risk for perioperative complications.   Her functional capacity is good at 5.81 METs according to the Duke Activity Status Index (DASI). Recommendations: According to ACC/AHA guidelines, no further cardiovascular testing needed.  The patient may proceed to surgery at acceptable risk.   Antiplatelet and/or Anticoagulation Recommendations: Aspirin  can be held for 5-7 days prior to her surgery.  Please resume Aspirin  post operatively when it is felt to be safe from a bleeding standpoint.   The patient was advised that if she develops new symptoms prior to surgery to contact our office to arrange for  a follow-up visit, and she verbalized understanding.  A copy of this note will be routed to requesting surgeon.  Time:   Today, I have spent 10 minutes with the patient with telehealth technology discussing medical history, symptoms, and management plan.    Hayden Kihara D Cipriano Millikan, NP  09/20/2023, 5:08 PM

## 2023-09-20 NOTE — Progress Notes (Signed)
 Subjective:   Michaela Kelley is a 83 y.o. who presents for a Medicare Wellness preventive visit.  As a reminder, Annual Wellness Visits don't include a physical exam, and some assessments may be limited, especially if this visit is performed virtually. We may recommend an in-person follow-up visit with your provider if needed.  Visit Complete: In person  Persons Participating in Visit: Patient.  AWV Questionnaire: Yes: Patient Medicare AWV questionnaire was completed by the patient on 09/20/2023; I have confirmed that all information answered by patient is correct and no changes since this date.  Cardiac Risk Factors include: advanced age (>80men, >20 women);hypertension;dyslipidemia     Objective:    Today's Vitals   09/20/23 1317  BP: (!) 140/78  Pulse: 78  SpO2: 96%  Weight: 141 lb 12.8 oz (64.3 kg)  Height: 5' 4 (1.626 m)  PainSc: 0-No pain   Body mass index is 24.34 kg/m.     09/20/2023    1:12 PM 09/15/2023    1:34 PM 01/17/2022    6:00 PM 01/10/2022   10:19 AM 10/01/2021   11:22 AM 09/08/2020    2:56 PM 08/14/2019   11:02 AM  Advanced Directives  Does Patient Have a Medical Advance Directive? Yes Yes Yes Yes Yes Yes Yes  Type of Estate agent of [place];Living will Healthcare Power of [place];Living will Healthcare Power of [place];Living will Healthcare Power of [place];Living will Healthcare Power of [place];Living will Healthcare Power of [place];Living will Healthcare Power of [place];Living will  Does patient want to make changes to medical advance directive? No - Patient declined No - Patient declined No - Patient declined  No - Patient declined No - Patient declined No - Patient declined  Copy of Healthcare Power of Attorney in Chart? Yes - validated most recent copy scanned in chart (See row information)    No - copy requested No - copy requested     Current Medications (verified) Outpatient Encounter Medications as of 09/20/2023   Medication Sig   acetaminophen  (TYLENOL ) 650 MG CR tablet Take 1,300 mg by mouth every 8 (eight) hours as needed for pain.   albuterol  (VENTOLIN  HFA) 108 (90 Base) MCG/ACT inhaler Use 2 inhalations  15 minutes Apart  every 4 hours  to Rescue Asthma   aspirin  EC 81 MG tablet Take 81 mg by mouth daily.   b complex vitamins capsule Take 1 capsule by mouth daily.   carboxymethylcellulose (REFRESH TEARS) 0.5 % SOLN Place 1 drop into both eyes 3 (three) times daily as needed (dry eyes).   celecoxib (CELEBREX) 200 MG capsule Take 200 mg by mouth daily as needed for moderate pain (pain score 4-6).   Cholecalciferol  (VITAMIN D ) 50 MCG (2000 UT) tablet Take 2,000 Units by mouth daily.   cyanocobalamin  (VITAMIN B12) 500 MCG tablet Take 500 mcg by mouth daily.   MAGNESIUM  PO Take 500 mg by mouth daily.   montelukast  (SINGULAIR ) 10 MG tablet TAKE ONE TABLET DAILY FOR ALLERGY   olmesartan  (BENICAR ) 40 MG tablet TAKE ONE TABLET DAILY FOR BLOOD PRESSURE   TRELEGY ELLIPTA  200-62.5-25 MCG/ACT AEPB INHALE ONE INHALATION INTO LUNGS DAILY   TURMERIC CURCUMIN PO Take 750 mg by mouth daily.   No facility-administered encounter medications on file as of 09/20/2023.    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   Cataract    Chronic kidney disease    CKD3   Ganglion, left wrist 05/11/2023   GERD (gastroesophageal reflux disease)    Hx: UTI (urinary tract infection)    Hyperlipidemia    Hypertension    Pain in right hip 09/16/2021   Pain in right knee 06/27/2022   Pain of left hand 04/26/2023   Pain of left hip joint 04/26/2023   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   Prediabetes    Seasonal allergic rhinitis     Vitamin D  deficiency    Past Surgical History:  Procedure Laterality Date   BACK SURGERY  1999   L5    DILATION AND CURETTAGE OF UTERUS     KNEE ARTHROPLASTY  2010   L knee   TONSILLECTOMY     TOTAL HIP ARTHROPLASTY Right 01/17/2022   Procedure: TOTAL HIP ARTHROPLASTY ANTERIOR APPROACH;  Surgeon: Melodi Lerner, MD;  Location: [facility];  Service: Orthopedics;  Laterality: Right;   TOTAL SHOULDER ARTHROPLASTY  02/10/2011   Procedure: TOTAL SHOULDER ARTHROPLASTY;  Surgeon: Franky HERO Supple;  Location: [facility];  Service: Orthopedics;  Laterality: Left;  TOTAL SHOULDER ARTHROPLASTY LEFT SIDE   TOTAL SHOULDER ARTHROPLASTY  12/22/2011   Procedure: TOTAL SHOULDER ARTHROPLASTY;  Surgeon: Franky HERO Pointer, MD;  Location: [facility];  Service: Orthopedics;  Laterality: Right;  right total shoulder arthroplasty   Family History  Problem Relation Age of Onset   Breast cancer Mother 68   Hypertension Father    Heart disease Brother    Esophageal cancer Brother    Heart disease Maternal Grandmother    Heart disease Maternal Grandfather    Breast cancer Paternal Grandmother    Diabetes Paternal Grandfather    Colon cancer Neg Hx    Rectal cancer Neg Hx    Stomach cancer Neg Hx    Social History   Socioeconomic History   Marital status: Widowed    Spouse name: Not on file   Number of children: 2   Years of education: Not on file   Highest education level: Some college, no degree  Occupational History   Not on file  Tobacco Use   Smoking status: Former    Current packs/day: 0.00    Average packs/day: 0.3 packs/day for 20.0 years (5.0 ttl pk-yrs)    Types: Cigarettes    Start date: 12/16/1971    Quit date: 12/16/1991    Years since quitting: 31.7   Smokeless tobacco: Never  Vaping Use   Vaping status: Never Used  Substance and Sexual Activity   Alcohol  use: Yes    Alcohol /week: 8.0 standard drinks of alcohol     Types: 8 Glasses of wine per week    Comment: 6-8 glasses per week    Drug use: No    Sexual activity: Not Currently    Partners: Male    Birth control/protection: Post-menopausal  Other Topics Concern   Not on file  Social History Narrative   Not on file   Social Drivers of Health   Financial Resource Strain: Low Risk  (09/20/2023)   Overall Financial Resource Strain (CARDIA)    Difficulty of Paying Living Expenses: Not hard at all  Food Insecurity: No Food Insecurity (09/20/2023)   Hunger Vital Sign    Worried About Running Out of Food in the Last Year: Never true    Ran Out of Food in the Last Year: Never true  Transportation Needs: No Transportation Needs (09/20/2023)   PRAPARE - Administrator, Civil Service (Medical): No    Lack of Transportation (Non-Medical): No  Physical Activity: Inactive (09/20/2023)   Exercise Vital Sign    Days of Exercise per Week: 0 days    Minutes of Exercise per Session: 0 min  Stress: No Stress Concern Present (09/20/2023)   [employer] of Occupational Health - Occupational Stress Questionnaire    Feeling of Stress: Not at all  Social Connections: Moderately Integrated (09/20/2023)   Social Connection and Isolation Panel    Frequency of Communication with Friends and Family: More than three times a week    Frequency of Social Gatherings with Friends and Family: More than three times a week    Attends Religious Services: More than 4 times per year    Active Member of [employer] or Organizations: Yes    Attends Banker Meetings: More than 4 times per year    Marital Status: Widowed    Tobacco Counseling Counseling given: Not Answered    Clinical Intake:  Pre-visit preparation completed: Yes  Pain : No/denies pain Pain Score: 0-No pain     BMI - recorded: 24.34 Nutritional Status: BMI of 19-24  Normal Nutritional Risks: None Diabetes: No  Lab Results  Component Value Date   HGBA1C 5.3 11/24/2022   HGBA1C 5.5 05/03/2022   HGBA1C 5.1 12/28/2021     How often do you need to have someone  help you when you read instructions, pamphlets, or other written materials from your doctor or pharmacy?: 1 - Never  Interpreter Needed?: No  Information entered by :: Michaela Kelley, Michaela Kelley   Activities of Daily Living     09/20/2023    1:22 PM 09/20/2023    9:52 AM  In your present state of health, do you have any difficulty performing the following activities:  Hearing? 0   Comment wears hearing aids   Vision? 0   Difficulty concentrating or making decisions? 0 0  Walking or climbing stairs? 1 1  Dressing or bathing? 0 0  Doing errands, shopping? 0 0  Preparing Food and eating ? N N  Using the Toilet? N N  In the past six months, have you accidently leaked urine? N   Do you have problems with loss of bowel control? N N  Managing your Medications? N N  Managing your Finances? N N  Housekeeping or managing your Housekeeping? N N    Patient Care Team: Lendia Boby CROME, NP-C as PCP - General (Family Medicine) Quinn Odor, [facility] (Inactive) as Pharmacist (Pharmacist) Leslee Reusing, MD as Consulting Physician (Ophthalmology) Melodi Lerner, MD as Consulting Physician (Orthopedic Surgery)  I have updated your Care Teams any recent Medical Services you may have received from other providers in the past year.     Assessment:   This is a routine wellness examination for Michaela Kelley.  Hearing/Vision screen Hearing Screening - Comments:: Denies hearing difficulties   Vision Screening - Comments:: Wears rx glasses - up to date with routine eye exams with Dr Leslee   Goals Addressed               This Visit's Progress     Patient Stated (pt-stated)        Patient stated she is planning left hip surgery, recover from that (Physical Therapy).       Depression Screen     09/20/2023    1:24 PM 09/01/2023    2:03  PM 06/22/2023    8:38 AM 11/23/2022   11:13 PM 08/24/2022    9:28 AM 05/02/2022   10:43 PM 01/30/2021    7:23 PM  PHQ 2/9 Scores  PHQ - 2 Score 0 0 0 0 0 0 0  PHQ- 9  Score 3          Fall Risk     09/20/2023    1:23 PM 09/20/2023    9:52 AM 09/01/2023    2:15 PM 06/22/2023    8:38 AM 11/23/2022   11:12 PM  Fall Risk   Falls in the past year? 0 0 0 0 0  Number falls in past yr: 0  0 0   Injury with Fall? 0  0 0   Risk for fall due to : No Fall Risks  No Fall Risks No Fall Risks No Fall Risks  Follow up Falls evaluation completed;Falls prevention discussed  Falls evaluation completed Falls evaluation completed Falls evaluation completed;Education provided;Falls prevention discussed    MEDICARE RISK AT HOME:  Medicare Risk at Home Any stairs in or around the home?: Yes If so, are there any without handrails?: No Home free of loose throw rugs in walkways, pet beds, electrical cords, etc?: No Adequate lighting in your home to reduce risk of falls?: Yes Life alert?: No Use of a cane, walker or w/c?: Yes (cane/walker) Grab bars in the bathroom?: No Shower chair or bench in shower?: No Elevated toilet seat or a handicapped toilet?: Yes  TIMED UP AND GO:  Was the test performed?  No  Cognitive Function: 6CIT completed        09/20/2023    1:26 PM  6CIT Screen  What Year? 0 points  What month? 0 points  What time? 0 points  Count back from 20 0 points  Months in reverse 0 points  Repeat phrase 0 points  Total Score 0 points    Immunizations Immunization History  Administered Date(s) Administered   Influenza, High Dose Seasonal PF 03/10/2015, 01/10/2017, 02/01/2018, 02/12/2019, 02/13/2020, 01/28/2021, 03/23/2022, 03/09/2023   PFIZER(Purple Top)SARS-COV-2 Vaccination 04/16/2019, 05/07/2019, 04/03/2020   Pneumococcal Conjugate-13 05/05/2014   Pneumococcal Polysaccharide-23 06/28/2006   Td 05/27/2011    Screening Tests Health Maintenance  Topic Date Due   Zoster Vaccines- Shingrix (1 of 2) Never done   DTaP/Tdap/Td (2 - Tdap) 05/26/2021   COVID-19 Vaccine (4 - 2024-25 season) 11/27/2022   INFLUENZA VACCINE  10/27/2023   Medicare  Annual Wellness (AWV)  09/19/2024   Pneumococcal Vaccine: 50+ Years  Completed   DEXA SCAN  Completed   Hepatitis B Vaccines  Aged Out   HPV VACCINES  Aged Out   Meningococcal B Vaccine  Aged Out    Health Maintenance  Health Maintenance Due  Topic Date Due   Zoster Vaccines- Shingrix (1 of 2) Never done   DTaP/Tdap/Td (2 - Tdap) 05/26/2021   COVID-19 Vaccine (4 - 2024-25 season) 11/27/2022   Health Maintenance Items Addressed:09/20/2023  I have recommended that this patient have a immunization for Shingles but she declines at this time. I have discussed the risks and benefits of this procedure with her. The patient verbalizes understanding.    Additional Screening:  Vision Screening: Recommended annual ophthalmology exams for early detection of glaucoma and other disorders of the eye. Would you like a referral to an eye doctor? No  Pt stated has had an eye exam w/Dr McCuen in 2025.  Dental Screening: Recommended annual dental exams for proper oral hygiene  Community Resource Referral / Chronic Care Management: CRR required this visit?  No   CCM required this visit?  No   Plan:    I have personally reviewed and noted the following in the patient's chart:   Medical and social history Use of alcohol , tobacco or illicit drugs  Current medications and supplements including opioid prescriptions. Patient is not currently taking opioid prescriptions. Functional ability and status Nutritional status Physical activity Advanced directives List of other physicians Hospitalizations, surgeries, and ER visits in previous 12 months Vitals Screenings to include cognitive, depression, and falls Referrals and appointments  In addition, I have reviewed and discussed with patient certain preventive protocols, quality metrics, and best practice recommendations. A written personalized care plan for preventive services as well as general preventive health recommendations were provided to  patient.   Michaela CHRISTELLA Kelley, Michaela Kelley   09/20/2023   After Visit Summary: (In Person-Declined) Patient declined AVS at this time.  Notes: Nothing significant to report at this time.

## 2023-09-21 NOTE — Progress Notes (Signed)
 Anesthesia Chart Review   Case: [id number] Date/Time: 09/27/23 1445   Procedure: ARTHROPLASTY, HIP, TOTAL, ANTERIOR APPROACH (Left: Hip)   Anesthesia type: Choice   Pre-op diagnosis: Left hip osteoarthritis   [facility] / [facility]   Surgeons: Melodi Lerner, MD       DISCUSSION:83 y.o. former smoker with /o HTN, asthma, CKD Stage III, left hip OA scheduled for above procedure 09/27/2023 with Dr. Lerner Melodi.   Per cardiology preoperative evaluation 09/20/2023, Preoperative Cardiovascular Risk Assessment: Ms. Dayrit's perioperative risk of a major cardiac event is 0.4% according to the Revised Cardiac Risk Index (RCRI).  Therefore, she is at low risk for perioperative complications.   Her functional capacity is good at 5.81 METs according to the Duke Activity Status Index (DASI). Recommendations: According to ACC/AHA guidelines, no further cardiovascular testing needed.  The patient may proceed to surgery at acceptable risk.   Antiplatelet and/or Anticoagulation Recommendations: Aspirin  can be held for 5-7 days prior to her surgery.  Please resume Aspirin  post operatively when it is felt to be safe from a bleeding standpoint.   Sodium 129 at PAT visit.  Per PCP notes sodium has been low chronically and is stable.  VS: BP (!) 165/68 Comment: right arm sitting  Pulse 72   Temp 36.8 C (Oral)   Resp 16   Ht 5' 2 (1.575 m)   Wt 63.5 kg   SpO2 100%   BMI 25.61 kg/m   PROVIDERS: Henson, Vickie L, NP-C is PCP    LABS: Labs reviewed: Acceptable for surgery. (all labs ordered are listed, but only abnormal results are displayed)  Labs Reviewed  COMPREHENSIVE METABOLIC PANEL WITH GFR - Abnormal; Notable for the following components:      Result Value   Sodium 129 (*)    Chloride 94 (*)    Glucose, Bld 111 (*)    GFR, Estimated 60 (*)    All other components within normal limits  CBC - Abnormal; Notable for the following components:   RBC 3.77 (*)    Hemoglobin 11.9 (*)     All other components within normal limits  SURGICAL PCR SCREEN  TYPE AND SCREEN     IMAGES:   EKG:   CV:  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   Cataract    Chronic kidney disease    CKD3   Ganglion, left wrist 05/11/2023   GERD (gastroesophageal reflux disease)    Hx: UTI (urinary tract infection)    Hyperlipidemia    Hypertension    Pain in right hip 09/16/2021   Pain in right knee 06/27/2022   Pain of left hand 04/26/2023   Pain of left hip joint 04/26/2023   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   Prediabetes    Seasonal allergic rhinitis    Vitamin D  deficiency     Past Surgical History:  Procedure Laterality Date   BACK SURGERY  1999   L5    DILATION AND CURETTAGE OF UTERUS     KNEE ARTHROPLASTY  2010   L knee   TONSILLECTOMY     TOTAL HIP ARTHROPLASTY Right 01/17/2022   Procedure: TOTAL HIP ARTHROPLASTY ANTERIOR APPROACH;  Surgeon: Melodi Lerner, MD;  Location: [facility];  Service: Orthopedics;  Laterality: Right;   TOTAL SHOULDER ARTHROPLASTY  02/10/2011  Procedure: TOTAL SHOULDER ARTHROPLASTY;  Surgeon: Franky HERO Supple;  Location: [facility];  Service: Orthopedics;  Laterality: Left;  TOTAL SHOULDER ARTHROPLASTY LEFT SIDE   TOTAL SHOULDER ARTHROPLASTY  12/22/2011   Procedure: TOTAL SHOULDER ARTHROPLASTY;  Surgeon: Franky HERO Pointer, MD;  Location: [facility];  Service: Orthopedics;  Laterality: Right;  right total shoulder arthroplasty    MEDICATIONS:  acetaminophen  (TYLENOL ) 650 MG CR tablet   albuterol  (VENTOLIN  HFA) 108 (90 Base) MCG/ACT inhaler   aspirin  EC 81 MG tablet   b complex vitamins capsule   carboxymethylcellulose (REFRESH TEARS) 0.5 % SOLN   celecoxib (CELEBREX) 200 MG capsule   Cholecalciferol  (VITAMIN D ) 50 MCG (2000 UT) tablet   cyanocobalamin  (VITAMIN B12) 500 MCG tablet   MAGNESIUM  PO   montelukast   (SINGULAIR ) 10 MG tablet   olmesartan  (BENICAR ) 40 MG tablet   TRELEGY ELLIPTA  200-62.5-25 MCG/ACT AEPB   TURMERIC CURCUMIN PO   No current facility-administered medications for this encounter.      Harlene Hoots Ward, PA-C [facility] [phone]

## 2023-09-21 NOTE — Anesthesia Preprocedure Evaluation (Addendum)
 Anesthesia Evaluation  Patient identified by MRN, date of birth, ID band Patient awake    Reviewed: Allergy & Precautions, NPO status , Patient's Chart, lab work & pertinent test results  Airway Mallampati: III  TM Distance: >3 FB Neck ROM: Full    Dental no notable dental hx. (+) Teeth Intact, Dental Advisory Given   Pulmonary asthma , former smoker   Pulmonary exam normal breath sounds clear to auscultation       Cardiovascular hypertension, Pt. on medications Normal cardiovascular exam Rhythm:Regular Rate:Normal     Neuro/Psych negative neurological ROS  negative psych ROS   GI/Hepatic Neg liver ROS,GERD  ,,  Endo/Other  negative endocrine ROS    Renal/GU Renal InsufficiencyRenal disease  negative genitourinary   Musculoskeletal  (+) Arthritis ,    Abdominal   Peds  Hematology negative hematology ROS (+)   Anesthesia Other Findings   Reproductive/Obstetrics                              Anesthesia Physical Anesthesia Plan  ASA: 2  Anesthesia Plan: Spinal   Post-op Pain Management: Ofirmev  IV (intra-op)*   Induction:   PONV Risk Score and Plan: 2 and Treatment may vary due to age or medical condition, Propofol  infusion, Dexamethasone  and Ondansetron   Airway Management Planned: Natural Airway  Additional Equipment:   Intra-op Plan:   Post-operative Plan:   Informed Consent: I have reviewed the patients History and Physical, chart, labs and discussed the procedure including the risks, benefits and alternatives for the proposed anesthesia with the patient or authorized representative who has indicated his/her understanding and acceptance.     Dental advisory given  Plan Discussed with: CRNA  Anesthesia Plan Comments: (See PAT note 09/15/2023)        Anesthesia Quick Evaluation

## 2023-09-25 NOTE — H&P (Signed)
 TOTAL HIP ADMISSION H&P  Patient is admitted for left total hip arthroplasty.  Subjective:  Chief Complaint: Left hip pain  HPI: Michaela Kelley, 83 y.o. female, has a history of pain and functional disability in the left hip due to arthritis and patient has failed non-surgical conservative treatments for greater than 12 weeks to include NSAID's and/or analgesics, corticosteriod injections, use of assistive devices, and activity modification. Onset of symptoms was abrupt, starting less than 1 year ago with rapidlly worsening course since that time. The patient noted no past surgery on the left hip. Patient currently rates pain in the left hip at 9 out of 10 with activity. Patient has night pain, worsening of pain with activity and weight bearing, and trendelenberg gait. 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 There is moderate acetabular subchondral cystic change noted by imaging studies. This condition presents safety issues increasing the risk of falls. There is no current active infection.  Patient Active Problem List   Diagnosis Date Noted   Cerumen impaction 09/01/2023   Bilateral hearing loss 09/01/2023   Chronic hyponatremia 06/22/2023   Anemia 06/22/2023   Mild intermittent asthma without complication 06/22/2023   Fatigue 06/22/2023   Environmental and seasonal allergies 06/22/2023   Osteoarthritis of right hip 01/17/2022   History of colon polyps 10/01/2021   Asymptomatic varicose veins of left lower extremity 10/01/2021   B12 deficiency 02/12/2020   Diverticulosis 01/15/2020   Pelvic congestion syndrome 01/15/2020   Osteoarthritis of right knee 07/03/2019   Osteoporosis 02/01/2018   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   Seasonal allergic rhinitis    Vitamin D  deficiency    Hyperlipidemia, mixed 01/02/2008   Essential hypertension 01/02/2008    Past Medical History:  Diagnosis Date   Allergy    Arthritis     Asthma    Cancer (HCC)    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   Cataract    Chronic kidney disease    CKD3   Ganglion, left wrist 05/11/2023   GERD (gastroesophageal reflux disease)    Hx: UTI (urinary tract infection)    Hyperlipidemia    Hypertension    Pain in right hip 09/16/2021   Pain in right knee 06/27/2022   Pain of left hand 04/26/2023   Pain of left hip joint 04/26/2023   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   Prediabetes    Seasonal allergic rhinitis    Vitamin D  deficiency     Past Surgical History:  Procedure Laterality Date   BACK SURGERY  1999   L5    DILATION AND CURETTAGE OF UTERUS     KNEE ARTHROPLASTY  2010   L knee   TONSILLECTOMY     TOTAL HIP ARTHROPLASTY Right 01/17/2022   Procedure: TOTAL HIP ARTHROPLASTY ANTERIOR APPROACH;  Surgeon: Melodi Lerner, MD;  Location: [facility];  Service: Orthopedics;  Laterality: Right;   TOTAL SHOULDER ARTHROPLASTY  02/10/2011   Procedure: TOTAL SHOULDER ARTHROPLASTY;  Surgeon: Franky HERO Supple;  Location: [facility];  Service: Orthopedics;  Laterality: Left;  TOTAL SHOULDER ARTHROPLASTY LEFT SIDE   TOTAL SHOULDER ARTHROPLASTY  12/22/2011   Procedure: TOTAL SHOULDER ARTHROPLASTY;  Surgeon: Franky HERO Pointer, MD;  Location: [facility];  Service: Orthopedics;  Laterality: Right;  right total shoulder arthroplasty    Prior to Admission medications   Medication Sig Start Date End Date Taking? Authorizing Provider  acetaminophen  (TYLENOL ) 650 MG CR tablet Take  1,300 mg by mouth every 8 (eight) hours as needed for pain.   Yes [provider]  albuterol  (VENTOLIN  HFA) 108 (90 Base) MCG/ACT inhaler Use 2 inhalations  15 minutes Apart  every 4 hours  to Rescue Asthma 03/19/22  Yes Tonita Fallow, MD  aspirin  EC 81 MG tablet Take 81 mg by mouth daily.   Yes [provider]  b complex vitamins capsule Take 1 capsule by mouth daily.   Yes [provider]   carboxymethylcellulose (REFRESH TEARS) 0.5 % SOLN Place 1 drop into both eyes 3 (three) times daily as needed (dry eyes).   Yes [provider]  celecoxib (CELEBREX) 200 MG capsule Take 200 mg by mouth daily as needed for moderate pain (pain score 4-6). 07/18/23  Yes [provider]  Cholecalciferol  (VITAMIN D ) 50 MCG (2000 UT) tablet Take 2,000 Units by mouth daily.   Yes [provider]  cyanocobalamin  (VITAMIN B12) 500 MCG tablet Take 500 mcg by mouth daily.   Yes [provider]  MAGNESIUM  PO Take 500 mg by mouth daily.   Yes [provider]  montelukast  (SINGULAIR ) 10 MG tablet TAKE ONE TABLET DAILY FOR ALLERGY 06/22/23  Yes Henson, Boby CROME, NP-C  TRELEGY ELLIPTA  200-62.5-25 MCG/ACT AEPB INHALE ONE INHALATION INTO LUNGS DAILY 03/01/23  Yes Cranford, Tonya, NP  TURMERIC CURCUMIN PO Take 750 mg by mouth daily.   Yes [provider]  olmesartan  (BENICAR ) 40 MG tablet TAKE ONE TABLET DAILY FOR BLOOD PRESSURE 09/20/23   Lendia, Vickie L, NP-C    Allergies  Allergen Reactions   Atorvastatin      Myalgias    Codeine Nausea And Vomiting and Other (See Comments)    Severe headaches (tolerates Dilaudid )    Fosamax  [Alendronate ] Other (See Comments)    Severe arthrlalgias   Hydrocodone -Acetaminophen  Nausea And Vomiting and Other (See Comments)    Severe headaches (tolerates Dilaudid )   Oxycodone -Acetaminophen  Nausea And Vomiting and Other (See Comments)    Severe headaches (tolerates Dilaudid )   Voltaren [Diclofenac] Rash    Gel    Social History   Socioeconomic History   Marital status: Widowed    Spouse name: Not on file   Number of children: 2   Years of education: Not on file   Highest education level: Some college, no degree  Occupational History   Not on file  Tobacco Use   Smoking status: Former    Current packs/day: 0.00    Average packs/day: 0.3 packs/day for 20.0 years (5.0 ttl pk-yrs)    Types: Cigarettes    Start  date: 12/16/1971    Quit date: 12/16/1991    Years since quitting: 31.7   Smokeless tobacco: Never  Vaping Use   Vaping status: Never Used  Substance and Sexual Activity   Alcohol  use: Yes    Alcohol /week: 8.0 standard drinks of alcohol     Types: 8 Glasses of wine per week    Comment: 6-8 glasses per week    Drug use: No   Sexual activity: Not Currently    Partners: Male    Birth control/protection: Post-menopausal  Other Topics Concern   Not on file  Social History Narrative   Not on file   Social Drivers of Health   Financial Resource Strain: Low Risk  (09/20/2023)   Overall Financial Resource Strain (CARDIA)    Difficulty of Paying Living Expenses: Not hard at all  Food Insecurity: No Food Insecurity (09/20/2023)   Hunger Vital Sign  Worried About Programme researcher, broadcasting/film/video in the Last Year: Never true    Ran Out of Food in the Last Year: Never true  Transportation Needs: No Transportation Needs (09/20/2023)   PRAPARE - Administrator, Civil Service (Medical): No    Lack of Transportation (Non-Medical): No  Physical Activity: Inactive (09/20/2023)   Exercise Vital Sign    Days of Exercise per Week: 0 days    Minutes of Exercise per Session: 0 min  Stress: No Stress Concern Present (09/20/2023)   [employer] of Occupational Health - Occupational Stress Questionnaire    Feeling of Stress: Not at all  Social Connections: Moderately Integrated (09/20/2023)   Social Connection and Isolation Panel    Frequency of Communication with Friends and Family: More than three times a week    Frequency of Social Gatherings with Friends and Family: More than three times a week    Attends Religious Services: More than 4 times per year    Active Member of [employer] or Organizations: Yes    Attends Banker Meetings: More than 4 times per year    Marital Status: Widowed  Intimate Partner Violence: Not At Risk (09/20/2023)   Humiliation, Afraid, Rape, and Kick questionnaire     Fear of Current or Ex-Partner: No    Emotionally Abused: No    Physically Abused: No    Sexually Abused: No    Tobacco Use: Medium Risk (09/20/2023)   Patient History    Smoking Tobacco Use: Former    Smokeless Tobacco Use: Never    Passive Exposure: Not on file   Social History   Substance and Sexual Activity  Alcohol  Use Yes   Alcohol /week: 8.0 standard drinks of alcohol    Types: 8 Glasses of wine per week   Comment: 6-8 glasses per week     Family History  Problem Relation Age of Onset   Breast cancer Mother 76   Hypertension Father    Heart disease Brother    Esophageal cancer Brother    Heart disease Maternal Grandmother    Heart disease Maternal Grandfather    Breast cancer Paternal Grandmother    Diabetes Paternal Grandfather    Colon cancer Neg Hx    Rectal cancer Neg Hx    Stomach cancer Neg Hx     Review of Systems  Constitutional:  Negative for chills and fever.  HENT: Negative.    Eyes: Negative.   Respiratory:  Negative for cough and shortness of breath.   Cardiovascular:  Negative for chest pain and palpitations.  Gastrointestinal:  Negative for abdominal pain, nausea and vomiting.  Genitourinary:  Negative for dysuria, frequency and urgency.  Musculoskeletal:  Positive for joint pain.  Skin:  Negative for rash.   Objective:  Physical Exam: Well nourished and well developed.  General: Alert and oriented x3, cooperative and pleasant, no acute distress.  Head: normocephalic, atraumatic, neck supple.  Eyes: EOMI.  Respiratory: breath sounds clear in all fields, no wheezing, rales, or rhonchi. Cardiovascular: Regular rate and rhythm, no murmurs, gallops or rubs.  Abdomen: non-tender to palpation and soft, normoactive bowel sounds. Musculoskeletal: Left Hip Exam: Slight tenderness to palpation at the greater trochanter. Pain with active and passive hip abduction and internal rotation. Range of motion: flexion 110 degrees, internal rotation 10  degrees, external rotation 20 degrees, and abduction 30 degrees. Pain with provocative testing of the left hip. Calves soft and nontender. Motor function intact in LE. Strength 5/5 LE bilaterally. Neuro:  Distal pulses 2+. Sensation to light touch intact in LE.  Vital signs in last 24 hours:    Imaging Review Plain radiographs demonstrate severe degenerative joint disease of the left hip. The bone quality appears to be adequate for age and reported activity level.  Assessment/Plan:  End stage arthritis, left hip  The patient history, physical examination, clinical judgement of the provider and imaging studies are consistent with end stage degenerative joint disease of the left hip and total hip arthroplasty is deemed medically necessary. The treatment options including medical management, injection therapy, arthroscopy and arthroplasty were discussed at length. The risks and benefits of total hip arthroplasty were presented and reviewed. The risks due to aseptic loosening, infection, stiffness, dislocation/subluxation, thromboembolic complications and other imponderables were discussed. The patient acknowledged the explanation, agreed to proceed with the plan and consent was signed. Patient is being admitted for inpatient treatment for surgery, pain control, PT, OT, prophylactic antibiotics, VTE prophylaxis, progressive ambulation and ADLs and discharge planning.The patient is planning to be discharged home.  Therapy Plans: HEP Disposition: Home with Daughter Planned DVT Prophylaxis: Aspirin  81 mg BID DME Needed: None PCP: Boby Mackintosh, NP (clearance received) Cardiologist: Katlyn D West, NP (clearance received) TXA: IV Allergies: alendronic acid (arthralgia), atorvastatin  (myalgias), codeine (vomiting), diclofenac (rash) Anesthesia Concerns: None BMI: 25.6 Last HgbA1c: not diabetic  [facility] (deliver to room)  Other: -Dilaudid  and tramadol  post-op - did this with her  last THA as she cannot tolerate hydrocodone  or oxycodone   - Patient was instructed on what medications to stop prior to surgery. - Follow-up visit in 2 weeks with Dr. Melodi - Begin physical therapy following surgery - Pre-operative lab work as pre-surgical testing - Prescriptions will be provided in hospital at time of discharge  R. Zelda Kobs, PA-C Orthopedic Surgery [facility]

## 2023-09-27 ENCOUNTER — Encounter (HOSPITAL_COMMUNITY): Payer: Self-pay | Admitting: Orthopedic Surgery

## 2023-09-27 ENCOUNTER — Observation Stay (HOSPITAL_COMMUNITY)
Admission: RE | Admit: 2023-09-27 | Discharge: 2023-09-29 | Disposition: A | Discharge status: Home / Self Care | Attending: Orthopedic Surgery | Admitting: Orthopedic Surgery

## 2023-09-27 ENCOUNTER — Observation Stay (HOSPITAL_COMMUNITY)

## 2023-09-27 ENCOUNTER — Ambulatory Visit (HOSPITAL_COMMUNITY): Payer: Self-pay | Admitting: Physician Assistant

## 2023-09-27 ENCOUNTER — Encounter (HOSPITAL_COMMUNITY)
Admission: RE | Disposition: A | Discharge status: Home / Self Care | Payer: Self-pay | Source: Home / Self Care | Attending: Orthopedic Surgery

## 2023-09-27 ENCOUNTER — Ambulatory Visit (HOSPITAL_COMMUNITY)

## 2023-09-27 ENCOUNTER — Ambulatory Visit (HOSPITAL_BASED_OUTPATIENT_CLINIC_OR_DEPARTMENT_OTHER): Admitting: Certified Registered Nurse Anesthetist

## 2023-09-27 DIAGNOSIS — I129 Hypertensive chronic kidney disease with stage 1 through stage 4 chronic kidney disease, or unspecified chronic kidney disease: Secondary | ICD-10-CM | POA: Diagnosis not present

## 2023-09-27 DIAGNOSIS — F1090 Alcohol use, unspecified, uncomplicated: Secondary | ICD-10-CM | POA: Insufficient documentation

## 2023-09-27 DIAGNOSIS — Z471 Aftercare following joint replacement surgery: Secondary | ICD-10-CM | POA: Diagnosis not present

## 2023-09-27 DIAGNOSIS — Z96643 Presence of artificial hip joint, bilateral: Secondary | ICD-10-CM | POA: Diagnosis not present

## 2023-09-27 DIAGNOSIS — M169 Osteoarthritis of hip, unspecified: Principal | ICD-10-CM | POA: Diagnosis present

## 2023-09-27 DIAGNOSIS — Z7982 Long term (current) use of aspirin: Secondary | ICD-10-CM | POA: Insufficient documentation

## 2023-09-27 DIAGNOSIS — M1612 Unilateral primary osteoarthritis, left hip: Secondary | ICD-10-CM

## 2023-09-27 DIAGNOSIS — J45909 Unspecified asthma, uncomplicated: Secondary | ICD-10-CM | POA: Insufficient documentation

## 2023-09-27 DIAGNOSIS — Z87891 Personal history of nicotine dependence: Secondary | ICD-10-CM | POA: Diagnosis not present

## 2023-09-27 DIAGNOSIS — N183 Chronic kidney disease, stage 3 unspecified: Secondary | ICD-10-CM | POA: Insufficient documentation

## 2023-09-27 HISTORY — PX: TOTAL HIP ARTHROPLASTY: SHX124

## 2023-09-27 SURGERY — ARTHROPLASTY, HIP, TOTAL, ANTERIOR APPROACH
Anesthesia: Spinal | Site: Hip | Laterality: Left

## 2023-09-27 MED ORDER — TRANEXAMIC ACID-NACL 1000-0.7 MG/100ML-% IV SOLN
1000.0000 mg | INTRAVENOUS | Status: AC
  Administered 2023-09-27: 1000 mg via INTRAVENOUS
  Filled 2023-09-27: qty 100

## 2023-09-27 MED ORDER — BUPIVACAINE-EPINEPHRINE (PF) 0.25% -1:200000 IJ SOLN
INTRAMUSCULAR | Status: AC
  Filled 2023-09-27: qty 30

## 2023-09-27 MED ORDER — METHOCARBAMOL 500 MG PO TABS
ORAL_TABLET | ORAL | Status: AC
  Filled 2023-09-27: qty 1

## 2023-09-27 MED ORDER — MENTHOL 3 MG MT LOZG: 1.0000 | LOZENGE | OROMUCOSAL | Status: DC | PRN

## 2023-09-27 MED ORDER — PHENYLEPHRINE HCL-NACL 20-0.9 MG/250ML-% IV SOLN
INTRAVENOUS | Status: DC | PRN
  Administered 2023-09-27: 40 ug/min via INTRAVENOUS

## 2023-09-27 MED ORDER — ALBUTEROL SULFATE (2.5 MG/3ML) 0.083% IN NEBU: 2.5000 mg | INHALATION_SOLUTION | RESPIRATORY_TRACT | Status: DC | PRN

## 2023-09-27 MED ORDER — FENTANYL CITRATE PF 50 MCG/ML IJ SOSY: 25.0000 ug | PREFILLED_SYRINGE | INTRAMUSCULAR | Status: DC | PRN

## 2023-09-27 MED ORDER — PHENOL 1.4 % MT LIQD: 1.0000 | OROMUCOSAL | Status: DC | PRN

## 2023-09-27 MED ORDER — STERILE WATER FOR IRRIGATION IR SOLN
Status: DC | PRN
  Administered 2023-09-27: 1000 mL

## 2023-09-27 MED ORDER — ONDANSETRON HCL 4 MG/2ML IJ SOLN
4.0000 mg | Freq: Four times a day (QID) | INTRAMUSCULAR | Status: DC | PRN
  Administered 2023-09-28: 4 mg via INTRAVENOUS
  Filled 2023-09-27: qty 2

## 2023-09-27 MED ORDER — ONDANSETRON HCL 4 MG PO TABS: 4.0000 mg | ORAL_TABLET | Freq: Four times a day (QID) | ORAL | Status: DC | PRN

## 2023-09-27 MED ORDER — ACETAMINOPHEN 325 MG PO TABS: 325.0000 mg | ORAL_TABLET | Freq: Four times a day (QID) | ORAL | Status: DC | PRN

## 2023-09-27 MED ORDER — SODIUM CHLORIDE 0.9 % IV SOLN: INTRAVENOUS | Status: DC

## 2023-09-27 MED ORDER — CEFAZOLIN SODIUM-DEXTROSE 2-4 GM/100ML-% IV SOLN
2.0000 g | INTRAVENOUS | Status: AC
  Administered 2023-09-27: 2 g via INTRAVENOUS
  Filled 2023-09-27: qty 100

## 2023-09-27 MED ORDER — METHOCARBAMOL 500 MG PO TABS
500.0000 mg | ORAL_TABLET | Freq: Four times a day (QID) | ORAL | Status: DC | PRN
Start: 2023-09-27 — End: 2023-09-28
  Administered 2023-09-27 – 2023-09-28 (×2): 500 mg via ORAL
  Filled 2023-09-27: qty 1

## 2023-09-27 MED ORDER — CEFAZOLIN SODIUM-DEXTROSE 2-4 GM/100ML-% IV SOLN
2.0000 g | Freq: Four times a day (QID) | INTRAVENOUS | Status: AC
  Administered 2023-09-28 (×2): 2 g via INTRAVENOUS
  Filled 2023-09-27 (×2): qty 100

## 2023-09-27 MED ORDER — TRAMADOL HCL 50 MG PO TABS
ORAL_TABLET | ORAL | Status: AC
Start: 2023-09-27 — End: 2023-09-27
  Filled 2023-09-27: qty 1

## 2023-09-27 MED ORDER — LACTATED RINGERS IV SOLN: INTRAVENOUS | Status: DC

## 2023-09-27 MED ORDER — TRAMADOL HCL 50 MG PO TABS
50.0000 mg | ORAL_TABLET | Freq: Four times a day (QID) | ORAL | Status: DC | PRN
  Administered 2023-09-27: 50 mg via ORAL

## 2023-09-27 MED ORDER — ACETAMINOPHEN 10 MG/ML IV SOLN
1000.0000 mg | Freq: Once | INTRAVENOUS | Status: AC
  Administered 2023-09-27: 1000 mg via INTRAVENOUS
  Filled 2023-09-27: qty 100

## 2023-09-27 MED ORDER — 0.9 % SODIUM CHLORIDE (POUR BTL) OPTIME
TOPICAL | Status: DC | PRN
  Administered 2023-09-27: 1000 mL

## 2023-09-27 MED ORDER — DOCUSATE SODIUM 100 MG PO CAPS
100.0000 mg | ORAL_CAPSULE | Freq: Two times a day (BID) | ORAL | Status: DC
  Administered 2023-09-28 – 2023-09-29 (×3): 100 mg via ORAL
  Filled 2023-09-27 (×3): qty 1

## 2023-09-27 MED ORDER — TRAMADOL HCL 50 MG PO TABS
50.0000 mg | ORAL_TABLET | Freq: Three times a day (TID) | ORAL | Status: DC | PRN
  Administered 2023-09-28: 100 mg via ORAL
  Filled 2023-09-27: qty 2

## 2023-09-27 MED ORDER — IRBESARTAN 150 MG PO TABS
300.0000 mg | ORAL_TABLET | Freq: Every day | ORAL | Status: DC
  Administered 2023-09-29: 300 mg via ORAL
  Filled 2023-09-27 (×2): qty 2

## 2023-09-27 MED ORDER — METOCLOPRAMIDE HCL 5 MG/ML IJ SOLN: 5.0000 mg | Freq: Three times a day (TID) | INTRAMUSCULAR | Status: DC | PRN

## 2023-09-27 MED ORDER — POLYETHYLENE GLYCOL 3350 17 G PO PACK: 17.0000 g | PACK | Freq: Every day | ORAL | Status: DC | PRN

## 2023-09-27 MED ORDER — CHLORHEXIDINE GLUCONATE 0.12 % MT SOLN
15.0000 mL | Freq: Once | OROMUCOSAL | Status: AC
  Administered 2023-09-27: 15 mL via OROMUCOSAL

## 2023-09-27 MED ORDER — METHOCARBAMOL 1000 MG/10ML IJ SOLN: 500.0000 mg | Freq: Four times a day (QID) | INTRAMUSCULAR | Status: DC | PRN

## 2023-09-27 MED ORDER — BUDESON-GLYCOPYRROL-FORMOTEROL 160-9-4.8 MCG/ACT IN AERO
2.0000 | INHALATION_SPRAY | Freq: Two times a day (BID) | RESPIRATORY_TRACT | Status: DC
  Administered 2023-09-28: 2 via RESPIRATORY_TRACT
  Filled 2023-09-27: qty 5.9

## 2023-09-27 MED ORDER — METOCLOPRAMIDE HCL 5 MG PO TABS: 5.0000 mg | ORAL_TABLET | Freq: Three times a day (TID) | ORAL | Status: DC | PRN

## 2023-09-27 MED ORDER — ORAL CARE MOUTH RINSE: 15.0000 mL | Freq: Once | OROMUCOSAL | Status: AC

## 2023-09-27 MED ORDER — ASPIRIN 81 MG PO CHEW
81.0000 mg | CHEWABLE_TABLET | Freq: Two times a day (BID) | ORAL | Status: DC
  Administered 2023-09-28 – 2023-09-29 (×3): 81 mg via ORAL
  Filled 2023-09-27 (×3): qty 1

## 2023-09-27 MED ORDER — PROPOFOL 1000 MG/100ML IV EMUL
INTRAVENOUS | Status: AC
  Filled 2023-09-27: qty 100

## 2023-09-27 MED ORDER — DEXAMETHASONE SODIUM PHOSPHATE 10 MG/ML IJ SOLN: 8.0000 mg | Freq: Once | INTRAMUSCULAR | Status: DC

## 2023-09-27 MED ORDER — PROPOFOL 10 MG/ML IV BOLUS
INTRAVENOUS | Status: DC | PRN
  Administered 2023-09-27: 20 mg via INTRAVENOUS
  Administered 2023-09-27: 50 mg via INTRAVENOUS

## 2023-09-27 MED ORDER — MAGNESIUM CITRATE PO SOLN
1.0000 | Freq: Once | ORAL | Status: DC | PRN
Start: 2023-09-27 — End: 2023-09-29

## 2023-09-27 MED ORDER — BUPIVACAINE-EPINEPHRINE (PF) 0.25% -1:200000 IJ SOLN: INTRAMUSCULAR | Status: DC | PRN

## 2023-09-27 MED ORDER — ALBUTEROL SULFATE HFA 108 (90 BASE) MCG/ACT IN AERS: 2.0000 | INHALATION_SPRAY | RESPIRATORY_TRACT | Status: DC | PRN

## 2023-09-27 MED ORDER — DEXAMETHASONE SODIUM PHOSPHATE 10 MG/ML IJ SOLN
10.0000 mg | Freq: Once | INTRAMUSCULAR | Status: AC
  Administered 2023-09-28: 10 mg via INTRAVENOUS
  Filled 2023-09-27: qty 1

## 2023-09-27 MED ORDER — PROPOFOL 500 MG/50ML IV EMUL
INTRAVENOUS | Status: DC | PRN
  Administered 2023-09-27: 85 ug/kg/min via INTRAVENOUS

## 2023-09-27 MED ORDER — POVIDONE-IODINE 10 % EX SWAB
2.0000 | Freq: Once | CUTANEOUS | Status: AC
  Administered 2023-09-27: 2 via TOPICAL

## 2023-09-27 MED ORDER — BISACODYL 10 MG RE SUPP
10.0000 mg | Freq: Every day | RECTAL | Status: DC | PRN
Start: 2023-09-27 — End: 2023-09-29

## 2023-09-27 MED ORDER — AMISULPRIDE (ANTIEMETIC) 5 MG/2ML IV SOLN: 10.0000 mg | Freq: Once | INTRAVENOUS | Status: DC | PRN

## 2023-09-27 MED ORDER — HYDROMORPHONE HCL 1 MG/ML IJ SOLN
0.5000 mg | INTRAMUSCULAR | Status: DC | PRN
  Administered 2023-09-27: 1 mg via INTRAVENOUS
  Filled 2023-09-27: qty 1

## 2023-09-27 MED ORDER — HYDROMORPHONE HCL 2 MG PO TABS
2.0000 mg | ORAL_TABLET | ORAL | Status: DC | PRN
  Administered 2023-09-28 – 2023-09-29 (×5): 2 mg via ORAL
  Filled 2023-09-27 (×5): qty 1

## 2023-09-27 SURGICAL SUPPLY — 36 items
BAG COUNTER SPONGE SURGICOUNT (BAG) IMPLANT
BAG ZIPLOCK 12X15 (MISCELLANEOUS) IMPLANT
BLADE SAG 18X100X1.27 (BLADE) ×1 IMPLANT
COVER PERINEAL POST (MISCELLANEOUS) ×1 IMPLANT
COVER SURGICAL LIGHT HANDLE (MISCELLANEOUS) ×1 IMPLANT
CUP ACETBLR 48 OD SECTOR II (Hips) IMPLANT
DERMABOND ADVANCED .7 DNX12 (GAUZE/BANDAGES/DRESSINGS) ×1 IMPLANT
DRAPE FOOT SWITCH (DRAPES) ×1 IMPLANT
DRAPE STERI IOBAN 125X83 (DRAPES) ×1 IMPLANT
DRAPE U-SHAPE 47X51 STRL (DRAPES) ×2 IMPLANT
DRSG AQUACEL AG ADV 3.5X10 (GAUZE/BANDAGES/DRESSINGS) ×1 IMPLANT
DURAPREP 26ML APPLICATOR (WOUND CARE) ×1 IMPLANT
ELECT REM PT RETURN 15FT ADLT (MISCELLANEOUS) ×1 IMPLANT
GLOVE BIO SURGEON STRL SZ 6.5 (GLOVE) IMPLANT
GLOVE BIO SURGEON STRL SZ7 (GLOVE) IMPLANT
GLOVE BIO SURGEON STRL SZ8 (GLOVE) ×1 IMPLANT
GLOVE BIOGEL PI IND STRL 7.0 (GLOVE) IMPLANT
GLOVE BIOGEL PI IND STRL 8 (GLOVE) ×1 IMPLANT
GOWN STRL REUS W/ TWL LRG LVL3 (GOWN DISPOSABLE) ×2 IMPLANT
HEAD FEM STD 28X+8.5 STRL (Hips) IMPLANT
HOLDER FOLEY CATH W/STRAP (MISCELLANEOUS) ×1 IMPLANT
KIT TURNOVER KIT A (KITS) ×1 IMPLANT
LINER ACET ALTRX NEUT +4X28X48 (Hips) IMPLANT
MANIFOLD NEPTUNE II (INSTRUMENTS) ×1 IMPLANT
PACK ANTERIOR HIP CUSTOM (KITS) ×1 IMPLANT
PENCIL SMOKE EVACUATOR COATED (MISCELLANEOUS) ×1 IMPLANT
SPIKE FLUID TRANSFER (MISCELLANEOUS) ×1 IMPLANT
STEM FEMORAL SZ 5MM STD ACTIS (Stem) IMPLANT
SUT ETHIBOND NAB CT1 #1 30IN (SUTURE) ×1 IMPLANT
SUT MNCRL AB 4-0 PS2 18 (SUTURE) ×1 IMPLANT
SUT VIC AB 2-0 CT1 TAPERPNT 27 (SUTURE) ×2 IMPLANT
SUTURE STRATFX 0 PDS 27 VIOLET (SUTURE) ×1 IMPLANT
TOWEL GREEN STERILE FF (TOWEL DISPOSABLE) ×1 IMPLANT
TRAY FOLEY MTR SLVR 14FR STAT (SET/KITS/TRAYS/PACK) IMPLANT
TRAY FOLEY MTR SLVR 16FR STAT (SET/KITS/TRAYS/PACK) ×1 IMPLANT
TUBE SUCTION HIGH CAP CLEAR NV (SUCTIONS) ×1 IMPLANT

## 2023-09-27 NOTE — Discharge Instructions (Addendum)
Michaela Aluisio, MD Total Joint Specialist [facility] [address] [place], [place] [address] [phone]  ANTERIOR APPROACH TOTAL HIP REPLACEMENT POSTOPERATIVE DIRECTIONS     Hip Rehabilitation, Guidelines Following Surgery  The results of a hip operation are greatly improved after range of motion and muscle strengthening exercises. Follow all safety measures which are given to protect your hip. If any of these exercises cause increased pain or swelling in your joint, decrease the amount until you are comfortable again. Then slowly increase the exercises. Call your caregiver if you have problems or questions.   BLOOD CLOT PREVENTION Take an 81 mg Aspirin two times a day for three weeks following surgery. Then take an 81 mg Aspirin once a day for three weeks. Then discontinue Aspirin. You may resume your vitamins/supplements upon discharge from the hospital. Do not take any NSAIDs (Advil, Aleve, Ibuprofen, Meloxicam, etc.) until you are 3 weeks out from surgery  HOME CARE INSTRUCTIONS  Remove items at home which could result in a fall. This includes throw rugs or furniture in walking pathways.  ICE to the affected hip as frequently as 20-30 minutes an hour and then as needed for pain and swelling. Continue to use ice on the hip for pain and swelling from surgery. You may notice swelling that will progress down to the foot and ankle. This is normal after surgery. Elevate the leg when you are not up walking on it.   Continue to use the breathing machine which will help keep your temperature down.  It is common for your temperature to cycle up and down following surgery, especially at night when you are not up moving around and exerting yourself.  The breathing machine keeps your lungs expanded and your temperature down.  DIET You may resume your previous home diet once your are discharged from the hospital.  DRESSING / WOUND CARE / SHOWERING You have an  adhesive waterproof bandage over the incision. Leave this in place until your first follow-up appointment. Once you remove this you will not need to place another bandage.  You may begin showering 3 days following surgery, but do not submerge the incision under water.  ACTIVITY For the first 3-5 days, it is important to rest and keep the operative leg elevated. You should, as a general rule, rest for 50 minutes and walk/stretch for 10 minutes per hour. After 5 days, you may slowly increase activity as tolerated.  Perform the exercises you were provided twice a day for about 15-20 minutes each session. Begin these 2 days following surgery. Walk with your walker as instructed. Use the walker until you are comfortable transitioning to a cane. Walk with the cane in the opposite hand of the operative leg. You may discontinue the cane once you are comfortable and walking steadily. Avoid periods of inactivity such as sitting longer than an hour when not asleep. This helps prevent blood clots.  Do not drive a car for 6 weeks or until released by your surgeon.  Do not drive while taking narcotics.  TED HOSE STOCKINGS Wear the elastic stockings on both legs for three weeks following surgery during the day. You may remove them at night while sleeping.  WEIGHT BEARING Weight bearing as tolerated with assist device (walker, cane, etc) as directed, use it as long as suggested by your surgeon or therapist, typically at least 4-6 weeks.  POSTOPERATIVE CONSTIPATION PROTOCOL Constipation - defined medically as fewer than three stools per week and severe constipation as   less than one stool per week.  One of the most common issues patients have following surgery is constipation.  Even if you have a regular bowel pattern at home, your normal regimen is likely to be disrupted due to multiple reasons following surgery.  Combination of anesthesia, postoperative narcotics, change in appetite and fluid intake all can  affect your bowels.  In order to avoid complications following surgery, here are some recommendations in order to help you during your recovery period.  Colace (docusate) - Pick up an over-the-counter form of Colace or another stool softener and take twice a day as long as you are requiring postoperative pain medications.  Take with a full glass of water daily.  If you experience loose stools or diarrhea, hold the colace until you stool forms back up.  If your symptoms do not get better within 1 week or if they get worse, check with your doctor. Dulcolax (bisacodyl) - Pick up over-the-counter and take as directed by the product packaging as needed to assist with the movement of your bowels.  Take with a full glass of water.  Use this product as needed if not relieved by Colace only.  MiraLax (polyethylene glycol) - Pick up over-the-counter to have on hand.  MiraLax is a solution that will increase the amount of water in your bowels to assist with bowel movements.  Take as directed and can mix with a glass of water, juice, soda, coffee, or tea.  Take if you go more than two days without a movement.Do not use MiraLax more than once per day. Call your doctor if you are still constipated or irregular after using this medication for 7 days in a row.  If you continue to have problems with postoperative constipation, please contact the office for further assistance and recommendations.  If you experience "the worst abdominal pain ever" or develop nausea or vomiting, please contact the office immediatly for further recommendations for treatment.  ITCHING  If you experience itching with your medications, try taking only a single pain pill, or even half a pain pill at a time.  You can also use Benadryl over the counter for itching or also to help with sleep.   MEDICATIONS See your medication summary on the "After Visit Summary" that the nursing staff will review with you prior to discharge.  You may have some home  medications which will be placed on hold until you complete the course of blood thinner medication.  It is important for you to complete the blood thinner medication as prescribed by your surgeon.  Continue your approved medications as instructed at time of discharge.  PRECAUTIONS If you experience chest pain or shortness of breath - call 911 immediately for transfer to the hospital emergency department.  If you develop a fever greater that 101 F, purulent drainage from wound, increased redness or drainage from wound, foul odor from the wound/dressing, or calf pain - CONTACT YOUR SURGEON.                                                   FOLLOW-UP APPOINTMENTS Make sure you keep all of your appointments after your operation with your surgeon and caregivers. You should call the office at the above phone number and make an appointment for approximately two weeks after the date of your surgery or on the   date instructed by your surgeon outlined in the "After Visit Summary".  RANGE OF MOTION AND STRENGTHENING EXERCISES  These exercises are designed to help you keep full movement of your hip joint. Follow your caregiver's or physical therapist's instructions. Perform all exercises about fifteen times, three times per day or as directed. Exercise both hips, even if you have had only one joint replacement. These exercises can be done on a training (exercise) mat, on the floor, on a table or on a bed. Use whatever works the best and is most comfortable for you. Use music or television while you are exercising so that the exercises are a pleasant break in your day. This will make your life better with the exercises acting as a break in routine you can look forward to.  Lying on your back, slowly slide your foot toward your buttocks, raising your knee up off the floor. Then slowly slide your foot back down until your leg is straight again.  Lying on your back spread your legs as far apart as you can without causing  discomfort.  Lying on your side, raise your upper leg and foot straight up from the floor as far as is comfortable. Slowly lower the leg and repeat.  Lying on your back, tighten up the muscle in the front of your thigh (quadriceps muscles). You can do this by keeping your leg straight and trying to raise your heel off the floor. This helps strengthen the largest muscle supporting your knee.  Lying on your back, tighten up the muscles of your buttocks both with the legs straight and with the knee bent at a comfortable angle while keeping your heel on the floor.   POST-OPERATIVE OPIOID TAPER INSTRUCTIONS: It is important to wean off of your opioid medication as soon as possible. If you do not need pain medication after your surgery it is ok to stop day one. Opioids include: Codeine, Hydrocodone(Norco, Vicodin), Oxycodone(Percocet, oxycontin) and hydromorphone amongst others.  Long term and even short term use of opiods can cause: Increased pain response Dependence Constipation Depression Respiratory depression And more.  Withdrawal symptoms can include Flu like symptoms Nausea, vomiting And more Techniques to manage these symptoms Hydrate well Eat regular healthy meals Stay active Use relaxation techniques(deep breathing, meditating, yoga) Do Not substitute Alcohol to help with tapering If you have been on opioids for less than two weeks and do not have pain than it is ok to stop all together.  Plan to wean off of opioids This plan should start within one week post op of your joint replacement. Maintain the same interval or time between taking each dose and first decrease the dose.  Cut the total daily intake of opioids by one tablet each day Next start to increase the time between doses. The last dose that should be eliminated is the evening dose.   IF YOU ARE TRANSFERRED TO A SKILLED REHAB FACILITY If the patient is transferred to a skilled rehab facility following release from the  hospital, a list of the current medications will be sent to the facility for the patient to continue.  When discharged from the skilled rehab facility, please have the facility set up the patient's Home Health Physical Therapy prior to being released. Also, the skilled facility will be responsible for providing the patient with their medications at time of release from the facility to include their pain medication, the muscle relaxants, and their blood thinner medication. If the patient is still at the rehab facility   at time of the two week follow up appointment, the skilled rehab facility will also need to assist the patient in arranging follow up appointment in our office and any transportation needs.  MAKE SURE YOU:  Understand these instructions.  Get help right away if you are not doing well or get worse.    DENTAL ANTIBIOTICS:  In most cases prophylactic antibiotics for Dental procdeures after total joint surgery are not necessary.  Exceptions are as follows:  1. History of prior total joint infection  2. Severely immunocompromised (Organ Transplant, cancer chemotherapy, Rheumatoid biologic meds such as Humera)  3. Poorly controlled diabetes (A1C &gt; 8.0, blood glucose over 200)  If you have one of these conditions, contact your surgeon for an antibiotic prescription, prior to your dental procedure.    Pick up stool softner and laxative for home use following surgery while on pain medications. Do not submerge incision under water. Please use good hand washing techniques while changing dressing each day. May shower starting three days after surgery. Please use a clean towel to pat the incision dry following showers. Continue to use ice for pain and swelling after surgery. Do not use any lotions or creams on the incision until instructed by your surgeon.  

## 2023-09-27 NOTE — Op Note (Signed)
 OPERATIVE REPORT- TOTAL HIP ARTHROPLASTY   PREOPERATIVE DIAGNOSIS: Osteoarthritis of the Left hip.   POSTOPERATIVE DIAGNOSIS: Osteoarthritis of the Left  hip.   PROCEDURE: Left total hip arthroplasty, anterior approach.   SURGEON: Dempsey Moan, MD   ASSISTANT: Zelda Kobs, PA-C  ANESTHESIA:  Spinal  ESTIMATED BLOOD LOSS:-200 mL    DRAINS: None  COMPLICATIONS: None   CONDITION: PACU - hemodynamically stable.   BRIEF CLINICAL NOTE: Michaela Kelley is a 83 y.o. female who has advanced end-  stage arthritis of their Left  hip with progressively worsening pain and  dysfunction.The patient has failed nonoperative management and presents for  total hip arthroplasty.   PROCEDURE IN DETAIL: After successful administration of spinal  anesthetic, the traction boots for the [facility] bed were placed on both  feet and the patient was placed onto the [facility] bed, boots placed into the leg  holders. The Left hip was then isolated from the perineum with plastic  drapes and prepped and draped in the usual sterile fashion. ASIS and  greater trochanter were marked and a oblique incision was made, starting  at about 1 cm lateral and 2 cm distal to the ASIS and coursing towards  the anterior cortex of the femur. The skin was cut with a 10 blade  through subcutaneous tissue to the level of the fascia overlying the  tensor fascia lata muscle. The fascia was then incised in line with the  incision at the junction of the anterior third and posterior 2/3rd. The  muscle was teased off the fascia and then the interval between the TFL  and the rectus was developed. The Hohmann retractor was then placed at  the top of the femoral neck over the capsule. The vessels overlying the  capsule were cauterized and the fat on top of the capsule was removed.  A Hohmann retractor was then placed anterior underneath the rectus  femoris to give exposure to the entire anterior capsule. A T-shaped  capsulotomy was  performed. The edges were tagged and the femoral head  was identified.       Osteophytes are removed off the superior acetabulum.  The femoral neck was then cut in situ with an oscillating saw. Traction  was then applied to the left lower extremity utilizing the [facility]  traction. The femoral head was then removed. Retractors were placed  around the acetabulum and then circumferential removal of the labrum was  performed. Osteophytes were also removed. Reaming starts at 45 mm to  medialize and  Increased in 2 mm increments to 47 mm. We reamed in  approximately 40 degrees of abduction, 20 degrees anteversion. A 48 mm  pinnacle acetabular shell was then impacted in anatomic position under  fluoroscopic guidance with excellent purchase. We did not need to place  any additional dome screws. A 28 mm neutral + 4 Altrx liner was then  placed into the acetabular shell.       The femoral lift was then placed along the lateral aspect of the femur  just distal to the vastus ridge. The leg was  externally rotated and capsule  was stripped off the inferior aspect of the femoral neck down to the  level of the lesser trochanter, this was done with electrocautery. The femur was lifted after this was performed. The  leg was then placed in an extended and adducted position essentially delivering the femur. We also removed the capsule superiorly and the piriformis from the piriformis fossa to  gain excellent exposure of the  proximal femur. Rongeur was used to remove some cancellous bone to get  into the lateral portion of the proximal femur for placement of the  initial starter reamer. The starter broaches was placed  the starter broach  and was shown to go down the center of the canal. Broaching  with the Actis system was then performed starting at size 0  coursing  Up to size 5. A size 5 had excellent torsional and rotational  and axial stability. The trial standard offset neck was then placed  with a 28 + 8.5  trial head. The hip was then reduced. We confirmed that  the stem was in the canal both on AP and lateral x-rays. It also has excellent sizing. The hip was reduced with outstanding stability through full extension and full external rotation.. AP pelvis was taken and the leg lengths were measured and found to be equal. Hip was then dislocated again and the femoral head and neck removed. The  femoral broach was removed. Size 5 Actis stem with a standard offset  neck was then impacted into the femur following native anteversion. Has  excellent purchase in the canal. Excellent torsional and rotational and  axial stability. It is confirmed to be in the canal on AP and lateral  fluoroscopic views. The 28 + 1.5 metal head was placed and the hip  reduced with outstanding stability. Again AP pelvis was taken and it  confirmed that the leg lengths were equal. The wound was then copiously  irrigated with saline solution and the capsule reattached and repaired  with Ethibond suture. 30 ml of .25% Bupivicaine was  injected into the capsule and into the edge of the tensor fascia lata as well as subcutaneous tissue. The fascia overlying the tensor fascia lata was then closed with a running #1 V-Loc. Subcu was closed with interrupted 2-0 Vicryl and subcuticular running 4-0 Monocryl. Incision was cleaned  and dried. Steri-Strips and a bulky sterile dressing applied. The patient was awakened and transported to  recovery in stable condition.        Please note that a surgical assistant was a medical necessity for this procedure to perform it in a safe and expeditious manner. Assistant was necessary to provide appropriate retraction of vital neurovascular structures and to prevent femoral fracture and allow for anatomic placement of the prosthesis.  Dempsey Moan, M.D.

## 2023-09-27 NOTE — Transfer of Care (Signed)
 Immediate Anesthesia Transfer of Care Note  Patient: SHAWNTA Kelley  Procedure(s) Performed: ARTHROPLASTY, HIP, TOTAL, ANTERIOR APPROACH (Left: Hip)  Patient Location: PACU  Anesthesia Type:Spinal  Level of Consciousness: awake, alert , and oriented  Airway & Oxygen Therapy: Patient Spontanous Breathing and Patient connected to face mask oxygen  Post-op Assessment: Report given to RN and Post -op Vital signs reviewed and stable  Post vital signs: Reviewed and stable  Last Vitals:  Vitals Value Taken Time  BP 108/63 09/27/23 16:36  Temp    Pulse 61 09/27/23 16:39  Resp 10 09/27/23 16:39  SpO2 100 % 09/27/23 16:39  Vitals shown include unfiled device data.  Last Pain:  Vitals:   09/27/23 1319  TempSrc:   PainSc: 0-No pain         Complications: No notable events documented.

## 2023-09-27 NOTE — Care Plan (Signed)
 Ortho Bundle Case Management Note  Patient Details  Name: Michaela Kelley MRN: [id number] Date of Birth: Nov 03, 1940  L THA on 09/27/23  DCP: Home with 2 daughters, 1 of which lives with her  DME: No needs. Has RW and cane.  PT: HEP                   DME Arranged:  N/A DME Agency:  NA  HH Arranged:    HH Agency:     Additional Comments: Please contact me with any questions of if this plan should need to change.  Lyle Pepper, [place] [facility] [phone]   09/27/2023, 1:30 PM

## 2023-09-27 NOTE — Interval H&P Note (Signed)
 History and Physical Interval Note:  09/27/2023 12:31 PM  Michaela Kelley  has presented today for surgery, with the diagnosis of Left hip osteoarthritis.  The various methods of treatment have been discussed with the patient and family. After consideration of risks, benefits and other options for treatment, the patient has consented to  Procedure(s): ARTHROPLASTY, HIP, TOTAL, ANTERIOR APPROACH (Left) as a surgical intervention.  The patient's history has been reviewed, patient examined, no change in status, stable for surgery.  I have reviewed the patient's chart and labs.  Questions were answered to the patient's satisfaction.     [name]

## 2023-09-28 ENCOUNTER — Other Ambulatory Visit (HOSPITAL_COMMUNITY): Payer: Self-pay

## 2023-09-28 ENCOUNTER — Other Ambulatory Visit: Payer: Self-pay

## 2023-09-28 ENCOUNTER — Encounter (HOSPITAL_COMMUNITY): Payer: Self-pay | Admitting: Orthopedic Surgery

## 2023-09-28 DIAGNOSIS — M1612 Unilateral primary osteoarthritis, left hip: Secondary | ICD-10-CM | POA: Diagnosis not present

## 2023-09-28 LAB — CBC
HCT: 31.9 % — ABNORMAL LOW (ref 36.0–46.0)
Hemoglobin: 10.8 g/dL — ABNORMAL LOW (ref 12.0–15.0)
MCH: 32.3 pg (ref 26.0–34.0)
MCHC: 33.9 g/dL (ref 30.0–36.0)
MCV: 95.5 fL (ref 80.0–100.0)
Platelets: 301 10*3/uL (ref 150–400)
RBC: 3.34 MIL/uL — ABNORMAL LOW (ref 3.87–5.11)
RDW: 12.7 % (ref 11.5–15.5)
WBC: 8.7 10*3/uL (ref 4.0–10.5)
nRBC: 0 % (ref 0.0–0.2)

## 2023-09-28 LAB — BASIC METABOLIC PANEL WITH GFR
Anion gap: 7 (ref 5–15)
BUN: 8 mg/dL (ref 8–23)
CO2: 24 mmol/L (ref 22–32)
Calcium: 8.8 mg/dL — ABNORMAL LOW (ref 8.9–10.3)
Chloride: 94 mmol/L — ABNORMAL LOW (ref 98–111)
Creatinine, Ser: 0.76 mg/dL (ref 0.44–1.00)
GFR, Estimated: >60 mL/min (ref >60)
Glucose, Bld: 132 mg/dL — ABNORMAL HIGH (ref 70–99)
Potassium: 3.9 mmol/L (ref 3.5–5.1)
Sodium: 125 mmol/L — ABNORMAL LOW (ref 135–145)

## 2023-09-28 MED ORDER — ONDANSETRON HCL 4 MG PO TABS
4.0000 mg | ORAL_TABLET | Freq: Four times a day (QID) | ORAL | 0 refills | Status: DC | PRN
  Filled 2023-09-28: qty 20, 5d supply, fill #0

## 2023-09-28 MED ORDER — CYCLOBENZAPRINE HCL 10 MG PO TABS
10.0000 mg | ORAL_TABLET | Freq: Three times a day (TID) | ORAL | Status: DC | PRN
  Administered 2023-09-28 (×2): 10 mg via ORAL
  Filled 2023-09-28 (×2): qty 1

## 2023-09-28 MED ORDER — HYDROMORPHONE HCL 2 MG PO TABS
2.0000 mg | ORAL_TABLET | Freq: Four times a day (QID) | ORAL | 0 refills | Status: DC | PRN
  Filled 2023-09-28: qty 42, 6d supply, fill #0

## 2023-09-28 MED ORDER — CYCLOBENZAPRINE HCL 10 MG PO TABS
10.0000 mg | ORAL_TABLET | Freq: Three times a day (TID) | ORAL | 0 refills | Status: DC | PRN
  Filled 2023-09-28: qty 30, 10d supply, fill #0

## 2023-09-28 MED ORDER — TRAMADOL HCL 50 MG PO TABS
50.0000 mg | ORAL_TABLET | Freq: Three times a day (TID) | ORAL | 0 refills | Status: DC | PRN
  Filled 2023-09-28: qty 40, 7d supply, fill #0

## 2023-09-28 MED ORDER — ASPIRIN 81 MG PO CHEW
81.0000 mg | CHEWABLE_TABLET | Freq: Two times a day (BID) | ORAL | 0 refills | Status: AC
Start: 2023-09-28 — End: 2023-10-19
  Filled 2023-09-28: qty 42, 21d supply, fill #0

## 2023-09-28 NOTE — Progress Notes (Signed)
 TOC meds in a secure bag delivered to inpatient pharmacy by this RN

## 2023-09-28 NOTE — Progress Notes (Signed)
   Subjective: 1 Day Post-Op Procedure(s) (LRB): ARTHROPLASTY, HIP, TOTAL, ANTERIOR APPROACH (Left) Patient reports pain as moderate.   Patient seen in rounds by Dr. Melodi. Patient is doing well other than pain in the left hip. Switched muscle relaxer this morning. Denies chest pain or SOB. No issues overnight. Foley catheter to be removed.  We will begin therapy today.   Objective: Vital signs in last 24 hours: Temp:  [96.4 F (35.8 C)-98.7 F (37.1 C)] 97.8 F (36.6 C) (07/03 0645) Pulse Rate:  [55-86] 72 (07/03 0645) Resp:  [10-25] 18 (07/03 0645) BP: (108-184)/(63-110) 124/63 (07/03 0645) SpO2:  [93 %-100 %] 93 % (07/03 0645) Weight:  [64 kg-65 kg] 65 kg (07/03 0100)  Intake/Output from previous day:  Intake/Output Summary (Last 24 hours) at 09/28/2023 0750 Last data filed at 09/28/2023 0617 Gross per 24 hour  Intake 2049.77 ml  Output 850 ml  Net 1199.77 ml     Intake/Output this shift: No intake/output data recorded.  Labs: Recent Labs    09/28/23 0330  HGB 10.8*   Recent Labs    09/28/23 0330  WBC 8.7  RBC 3.34*  HCT 31.9*  PLT 301   Recent Labs    09/28/23 0330  NA 125*  K 3.9  CL 94*  CO2 24  BUN 8  CREATININE 0.76  GLUCOSE 132*  CALCIUM  8.8*   No results for input(s): LABPT, INR in the last 72 hours.  Exam: General - Patient is Alert and Oriented Extremity - Neurologically intact Neurovascular intact Sensation intact distally Dorsiflexion/Plantar flexion intact Dressing - dressing C/D/I Motor Function - intact, moving foot and toes well on exam.   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   Weight as of this encounter: 65 kg. Up with therapy  DVT Prophylaxis - Aspirin  Weight bearing as tolerated. Begin therapy. MR switched to flexeril.  Plan is to go Home after hospital stay. Plan for discharge with HEP later today if progresses with therapy and meeting goals. Follow-up in the office in 2 weeks.  The PDMP database was reviewed today prior to any opioid medications being prescribed to this patient.  Roxie Mess, PA-C Orthopedic Surgery [phone] 09/28/2023, 7:50 AM

## 2023-09-28 NOTE — Progress Notes (Signed)
 Maintenance IV fluids resumed at this time due to hypotension after PT session.  Educated patient on importance of eating hearty meals and drinking fluids due to current pain medication dose.     09/28/23 1100  Vitals  BP (!) 85/54  BP Location Left Arm  BP Method Automatic  Patient Position (if appropriate)  (reclined)  Pulse Rate 80  MEWS COLOR  MEWS Score Color Green  Oxygen Therapy  SpO2 100 %  O2 Device Room Air

## 2023-09-28 NOTE — Progress Notes (Signed)
 Vitals rechecked.  Denies dizziness, SOB after resting in recliner and resuming IV fluids.     09/28/23 1307  Vitals  Temp 98.5 F (36.9 C)  BP 137/70  MAP (mmHg) 91  BP Location Left Arm  BP Method Automatic  Patient Position (if appropriate) Sitting  Pulse Rate 84  Pulse Rate Source Monitor  Resp 16  MEWS COLOR  MEWS Score Color Green  Oxygen Therapy  SpO2 100 %  O2 Device Room Air  MEWS Score  MEWS Temp 0  MEWS Systolic 0  MEWS Pulse 0  MEWS RR 0  MEWS LOC 0  MEWS Score 0

## 2023-09-28 NOTE — TOC Transition Note (Signed)
 Transition of Care [facility]) - Discharge Note   Patient Details  Name: Michaela Kelley MRN: [id number] Date of Birth: 1940/05/11  [facility]) CM/SW Contact:  NORMAN ASPEN, LCSW Phone Number: 09/28/2023, 2:17 PM   Clinical Narrative:     Met with pt who confirms she has all needed DME in the home. Plan for HEP.  No further TOC needs.    Final next level of care: Home/Self Care Barriers to Discharge: No Barriers Identified   Patient Goals and CMS Choice Patient states their goals for this hospitalization and ongoing recovery are:: return home          Discharge Placement                       Discharge Plan and Services Additional resources added to the After Visit Summary for                  DME Arranged: N/A DME Agency: NA                  Social Drivers of Health (SDOH) Interventions SDOH Screenings   Food Insecurity: No Food Insecurity (09/28/2023)  Housing: Low Risk  (09/28/2023)  Transportation Needs: No Transportation Needs (09/28/2023)  Utilities: Not At Risk (09/28/2023)  Alcohol  Screen: Low Risk  (09/20/2023)  Depression (PHQ2-9): Low Risk  (09/20/2023)  Financial Resource Strain: Low Risk  (09/20/2023)  Physical Activity: Inactive (09/20/2023)  Social Connections: Moderately Integrated (09/28/2023)  Stress: No Stress Concern Present (09/20/2023)  Tobacco Use: Medium Risk (09/27/2023)  Health Literacy: Adequate Health Literacy (09/20/2023)     Readmission Risk Interventions     No data to display

## 2023-09-28 NOTE — Evaluation (Signed)
 Physical Therapy Evaluation Patient Details Name: Michaela Kelley MRN: [id number] DOB: Jul 09, 1940 Today's Date: 09/28/2023  History of Present Illness  83 yo female s/p L DA THA on 09/27/23. PMH including but not ltd to-- HTN, L TKA, bil TSA, R DA THA  Clinical Impression  Pt is s/p THA resulting in the deficits listed below (see PT Problem List).  Pt limited by dizziness this am, able to transfer bed>BSC> chair. BP 85/54, RN made aware  Pt will benefit from acute skilled PT to increase their independence and safety with mobility to facilitate discharge.          If plan is discharge home, recommend the following: A little help with walking and/or transfers;A little help with bathing/dressing/bathroom;Assistance with cooking/housework;Help with stairs or ramp for entrance;Assist for transportation   Can travel by private vehicle        Equipment Recommendations None recommended by PT  Recommendations for Other Services       Functional Status Assessment Patient has had a recent decline in their functional status and demonstrates the ability to make significant improvements in function in a reasonable and predictable amount of time.     Precautions / Restrictions Precautions Precautions: Fall;Knee Restrictions Weight Bearing Restrictions Per Provider Order: No Other Position/Activity Restrictions: WBAT      Mobility  Bed Mobility Overal bed mobility: Needs Assistance Bed Mobility: Supine to Sit     Supine to sit: Min assist     General bed mobility comments: assist with LLE, lateral. incr time needed    Transfers Overall transfer level: Needs assistance Equipment used: Rolling walker (2 wheels) Transfers: Sit to/from Stand, Bed to chair/wheelchair/BSC Sit to Stand: Min assist           General transfer comment: cues for hand placement and LE position. assist to rise and steady for SPT. pt with incr dizziness, BP 85/54 after  reclined    Ambulation/Gait                   Stairs            Wheelchair Mobility     Tilt Bed    Modified Rankin (Stroke Patients Only)       Balance Overall balance assessment: No apparent balance deficits (not formally assessed)                                           Pertinent Vitals/Pain Pain Assessment Pain Assessment: 0-10 Pain Score: 4  Pain Location: L hip Pain Descriptors / Indicators: Sore Pain Intervention(s): Limited activity within patient's tolerance, Monitored during session, Premedicated before session, Ice applied, Repositioned    Home Living Family/patient expects to be discharged to:: Private residence Living Arrangements: Children Available Help at Discharge: Family Type of Home: House Home Access: Stairs to enter Entrance Stairs-Rails: Left (handrail in front)     Home Layout: One level Home Equipment: Agricultural consultant (2 wheels);Cane - single point      Prior Function Prior Level of Function : Independent/Modified Independent;Driving             Mobility Comments: cane or RW. denies falls ADLs Comments: independent     Extremity/Trunk Assessment   Upper Extremity Assessment Upper Extremity Assessment: Overall WFL for tasks assessed    Lower Extremity Assessment Lower Extremity Assessment: LLE deficits/detail LLE Deficits / Details: ankle WFL, AAROM grossly WFL--MMT  ltd by pain       Communication   Communication Communication: No apparent difficulties    Cognition Arousal: Alert Behavior During Therapy: WFL for tasks assessed/performed   PT - Cognitive impairments: No apparent impairments                         Following commands: Intact       Cueing       General Comments      Exercises Total Joint Exercises Ankle Circles/Pumps: AROM, Both, 10 reps Heel Slides: AAROM, Left, 5 reps   Assessment/Plan    PT Assessment Patient needs continued PT services  PT Problem List Decreased strength;Decreased  activity tolerance;Decreased balance;Decreased knowledge of use of DME;Pain;Decreased mobility       PT Treatment Interventions DME instruction;Therapeutic exercise;Gait training;Functional mobility training;Therapeutic activities;Patient/family education    PT Goals (Current goals can be found in the Care Plan section)  Acute Rehab PT Goals PT Goal Formulation: With patient Time For Goal Achievement: 10/05/23 Potential to Achieve Goals: Good    Frequency 7X/week     Co-evaluation               AM-PAC PT 6 Clicks Mobility  Outcome Measure Help needed turning from your back to your side while in a flat bed without using bedrails?: A Little Help needed moving from lying on your back to sitting on the side of a flat bed without using bedrails?: A Little Help needed moving to and from a bed to a chair (including a wheelchair)?: A Little Help needed standing up from a chair using your arms (e.g., wheelchair or bedside chair)?: A Little Help needed to walk in hospital room?: A Lot Help needed climbing 3-5 steps with a railing? : A Lot 6 Click Score: 16    End of Session Equipment Utilized During Treatment: Gait belt Activity Tolerance: Patient tolerated treatment well Patient left: with call bell/phone within reach;in chair;with chair alarm set;with family/visitor present Nurse Communication: Mobility status PT Visit Diagnosis: Other abnormalities of gait and mobility (R26.89)    Time: 8965-8893 PT Time Calculation (min) (ACUTE ONLY): 32 min   Charges:   PT Evaluation $PT Eval Low Complexity: 1 Low PT Treatments $Therapeutic Activity: 8-22 mins PT General Charges $$ ACUTE PT VISIT: 1 Visit         Campbell Agramonte, PT  Acute Rehab Dept [facility]) [phone]  09/28/2023   [facility] 09/28/2023, 11:25 AM

## 2023-09-28 NOTE — Progress Notes (Signed)
 Physical Therapy Treatment Patient Details Name: Michaela Kelley MRN: [id number] DOB: 09-08-1940 Today's Date: 09/28/2023   History of Present Illness 83 yo female s/p L DA THA on 09/27/23. PMH including but not ltd to-- HTN, L TKA, bil TSA, R DA THA    PT Comments  Pt progressing well this session. Amb~ 70' with RW, CGA--no dizziness this pm only limited by fatigue.  Continue PT POC, pt will likely be ready for d/c tomorrow   If plan is discharge home, recommend the following: A little help with walking and/or transfers;A little help with bathing/dressing/bathroom;Assistance with cooking/housework;Help with stairs or ramp for entrance;Assist for transportation   Can travel by private vehicle        Equipment Recommendations  None recommended by PT    Recommendations for Other Services       Precautions / Restrictions Precautions Precautions: Fall;Knee Precaution Booklet Issued: No Recall of Precautions/Restrictions: Intact Restrictions Weight Bearing Restrictions Per Provider Order: No Other Position/Activity Restrictions: WBAT     Mobility  Bed Mobility Overal bed mobility: Needs Assistance Bed Mobility: Sit to Supine     Supine to sit: Min assist Sit to supine: Min assist   General bed mobility comments: assist with LLE, incr time needed    Transfers Overall transfer level: Needs assistance Equipment used: Rolling walker (2 wheels) Transfers: Sit to/from Stand, Bed to chair/wheelchair/BSC Sit to Stand: Min assist   Step pivot transfers: Min assist       General transfer comment: cues for hand placement and LE position.    Ambulation/Gait Ambulation/Gait assistance: Contact guard assist Gait Distance (Feet): 45 Feet Assistive device: Rolling walker (2 wheels) Gait Pattern/deviations: Step-to pattern Gait velocity: decr     General Gait Details: cues for sequence and RW position. no dizziness   Stairs             Wheelchair Mobility     Tilt  Bed    Modified Rankin (Stroke Patients Only)       Balance Overall balance assessment: No apparent balance deficits (not formally assessed)                                          Communication Communication Communication: No apparent difficulties  Cognition Arousal: Alert Behavior During Therapy: WFL for tasks assessed/performed   PT - Cognitive impairments: No apparent impairments                         Following commands: Intact      Cueing Cueing Techniques: Verbal cues  Exercises Total Joint Exercises Ankle Circles/Pumps: AROM, Both, 10 reps Heel Slides: AAROM, Left, 10 reps    General Comments        Pertinent Vitals/Pain Pain Assessment Pain Assessment: 0-10 Pain Score: 5  Pain Location: L hip Pain Descriptors / Indicators: Sore Pain Intervention(s): Limited activity within patient's tolerance, Monitored during session, Repositioned, Premedicated before session    Home Living                          Prior Function            PT Goals (current goals can now be found in the care plan section) Acute Rehab PT Goals PT Goal Formulation: With patient Time For Goal Achievement: 10/05/23 Potential to Achieve Goals: Good Progress towards  PT goals: Progressing toward goals    Frequency    7X/week      PT Plan      Co-evaluation              AM-PAC PT 6 Clicks Mobility   Outcome Measure  Help needed turning from your back to your side while in a flat bed without using bedrails?: A Little Help needed moving from lying on your back to sitting on the side of a flat bed without using bedrails?: A Little Help needed moving to and from a bed to a chair (including a wheelchair)?: A Little Help needed standing up from a chair using your arms (e.g., wheelchair or bedside chair)?: A Little Help needed to walk in hospital room?: A Little Help needed climbing 3-5 steps with a railing? : A Little 6 Click  Score: 18    End of Session Equipment Utilized During Treatment: Gait belt Activity Tolerance: Patient tolerated treatment well Patient left: in bed;with call bell/phone within reach;with bed alarm set;with family/visitor present Nurse Communication: Mobility status PT Visit Diagnosis: Other abnormalities of gait and mobility (R26.89)     Time: 8565-8492 PT Time Calculation (min) (ACUTE ONLY): 33 min  Charges:    $Gait Training: 8-22 mins $Therapeutic Activity: 8-22 mins PT General Charges $$ ACUTE PT VISIT: 1 Visit                     Tyheem Boughner, PT  Acute Rehab Dept [facility]) [phone]  09/28/2023    [facility] 09/28/2023, 3:19 PM

## 2023-09-29 DIAGNOSIS — M1612 Unilateral primary osteoarthritis, left hip: Secondary | ICD-10-CM | POA: Diagnosis not present

## 2023-09-29 LAB — CBC
HCT: 27.5 % — ABNORMAL LOW (ref 36.0–46.0)
Hemoglobin: 9.4 g/dL — ABNORMAL LOW (ref 12.0–15.0)
MCH: 32.2 pg (ref 26.0–34.0)
MCHC: 34.2 g/dL (ref 30.0–36.0)
MCV: 94.2 fL (ref 80.0–100.0)
Platelets: 247 K/uL (ref 150–400)
RBC: 2.92 MIL/uL — ABNORMAL LOW (ref 3.87–5.11)
RDW: 12.7 % (ref 11.5–15.5)
WBC: 10.1 K/uL (ref 4.0–10.5)
nRBC: 0 % (ref 0.0–0.2)

## 2023-09-29 NOTE — Plan of Care (Signed)
 Patient verbalizes understanding of plan of care and completion upon discharge

## 2023-09-29 NOTE — Progress Notes (Signed)
    Subjective: 2 Days Post-Op Procedure(s) (LRB): ARTHROPLASTY, HIP, TOTAL, ANTERIOR APPROACH (Left) Patient reports pain as 4 on 0-10 scale.   Denies CP or SOB.  Voiding without difficulty. Positive flatus. Objective: Vital signs in last 24 hours: Temp:  [97.9 F (36.6 C)-98.5 F (36.9 C)] 97.9 F (36.6 C) (07/03 2120) Pulse Rate:  [80-93] 93 (07/03 2120) Resp:  [16-17] 16 (07/03 2120) BP: (85-159)/(54-85) 159/80 (07/03 2120) SpO2:  [97 %-100 %] 100 % (07/03 2120)  Intake/Output from previous day: 07/03 0701 - 07/04 0700 In: 1926 [P.O.:555; I.V.:1271; IV Piggyback:100] Out: 1300 [Urine:1300] Intake/Output this shift: No intake/output data recorded.  Labs: Recent Labs    09/28/23 0330 09/29/23 0319  HGB 10.8* 9.4*   Recent Labs    09/28/23 0330 09/29/23 0319  WBC 8.7 10.1  RBC 3.34* 2.92*  HCT 31.9* 27.5*  PLT 301 247   Recent Labs    09/28/23 0330  NA 125*  K 3.9  CL 94*  CO2 24  BUN 8  CREATININE 0.76  GLUCOSE 132*  CALCIUM  8.8*   No results for input(s): LABPT, INR in the last 72 hours.  Physical Exam: Sensation intact distally Intact pulses distally Dorsiflexion/Plantar flexion intact Incision: dressing C/D/I No cellulitis present Compartment soft Body mass index is 26.21 kg/m.   Assessment/Plan: 2 Days Post-Op Procedure(s) (LRB): ARTHROPLASTY, HIP, TOTAL, ANTERIOR APPROACH (Left) Continue Care Continue pain medical management  Continue Incentive spirometry  Continue mobilization with PT Continue to ice surgical site WBAT HGB today was 9.4; no s/sx of anemia   Plan for discharge to home today with HEP pending PT clearance.   Jeoffrey LOISE Sages for Dr. Donaciano Sprang [facility] [phone] 09/29/2023, 9:22 AM

## 2023-09-29 NOTE — Progress Notes (Signed)
 AVS has been given to patient. Patient was taken downstairs via wheel chair and discharged to family.

## 2023-09-29 NOTE — Progress Notes (Signed)
 Physical Therapy Treatment Patient Details Name: Michaela Kelley MRN: [id number] DOB: July 02, 1940 Today's Date: 09/29/2023   History of Present Illness 83 yo female s/p L DA THA on 09/27/23. PMH including but not ltd to-- HTN, L TKA, bil TSA, R DA THA    PT Comments  Pt progressing quite well this session. Reviewed mobility as below, dtr present for session.  Meeting PT goals and is ready to d/c home with family assist as needed from PT standpoint. Pt pleased with progress today.    If plan is discharge home, recommend the following: A little help with walking and/or transfers;A little help with bathing/dressing/bathroom;Assistance with cooking/housework;Help with stairs or ramp for entrance;Assist for transportation   Can travel by private vehicle        Equipment Recommendations  None recommended by PT    Recommendations for Other Services       Precautions / Restrictions Precautions Precautions: Fall;Knee Precaution Booklet Issued: No Recall of Precautions/Restrictions: Intact Restrictions Weight Bearing Restrictions Per Provider Order: No Other Position/Activity Restrictions: WBAT     Mobility  Bed Mobility Overal bed mobility: Needs Assistance Bed Mobility: Supine to Sit     Supine to sit: Supervision     General bed mobility comments: able to self progress LLE  off bed using leg lifter    Transfers Overall transfer level: Needs assistance Equipment used: Rolling walker (2 wheels) Transfers: Sit to/from Stand, Bed to chair/wheelchair/BSC Sit to Stand: Supervision, Contact guard assist           General transfer comment: cues for hand placement and LE position; supervision to CGA form varying ht surfaces    Ambulation/Gait Ambulation/Gait assistance: Supervision Gait Distance (Feet): 50 Feet Assistive device: Rolling walker (2 wheels) Gait Pattern/deviations: Step-to pattern Gait velocity: decr     General Gait Details: cues for sequence and RW position.  no dizziness   Stairs Stairs: Yes Stairs assistance: Contact guard assist, Supervision Stair Management: One rail Right, One rail Left, Step to pattern, Sideways Number of Stairs: 2 General stair comments: cues for sequence and technique, good stability with no LOB. dtr present   Wheelchair Mobility     Tilt Bed    Modified Rankin (Stroke Patients Only)       Balance Overall balance assessment: No apparent balance deficits (not formally assessed)                                          Communication Communication Communication: No apparent difficulties  Cognition Arousal: Alert Behavior During Therapy: WFL for tasks assessed/performed   PT - Cognitive impairments: No apparent impairments                         Following commands: Intact      Cueing Cueing Techniques: Verbal cues  Exercises Total Joint Exercises Ankle Circles/Pumps: AROM, Both, 10 reps Quad Sets: AROM, Both, 10 reps Heel Slides: AAROM, Left, 10 reps    General Comments        Pertinent Vitals/Pain Pain Assessment Pain Assessment: 0-10 Pain Score: 4  Pain Location: L hip Pain Descriptors / Indicators: Sore Pain Intervention(s): Limited activity within patient's tolerance, Monitored during session, Premedicated before session, Repositioned, Ice applied    Home Living  Prior Function            PT Goals (current goals can now be found in the care plan section) Acute Rehab PT Goals PT Goal Formulation: With patient Time For Goal Achievement: 10/05/23 Potential to Achieve Goals: Good Progress towards PT goals: Progressing toward goals    Frequency    7X/week      PT Plan      Co-evaluation              AM-PAC PT 6 Clicks Mobility   Outcome Measure  Help needed turning from your back to your side while in a flat bed without using bedrails?: A Little Help needed moving from lying on your back to  sitting on the side of a flat bed without using bedrails?: A Little Help needed moving to and from a bed to a chair (including a wheelchair)?: A Little Help needed standing up from a chair using your arms (e.g., wheelchair or bedside chair)?: A Little Help needed to walk in hospital room?: A Little Help needed climbing 3-5 steps with a railing? : A Little 6 Click Score: 18    End of Session Equipment Utilized During Treatment: Gait belt Activity Tolerance: Patient tolerated treatment well Patient left: in bed;with call bell/phone within reach;with bed alarm set;with family/visitor present Nurse Communication: Mobility status PT Visit Diagnosis: Other abnormalities of gait and mobility (R26.89)     Time: 9054-8970 PT Time Calculation (min) (ACUTE ONLY): 44 min  Charges:    $Gait Training: 23-37 mins $Therapeutic Exercise: 8-22 mins PT General Charges $$ ACUTE PT VISIT: 1 Visit                     Rexene, PT  Acute Rehab Dept ([facility]) [phone]  09/29/2023    [facility] 09/29/2023, 11:08 AM

## 2023-10-07 MED ORDER — BUPIVACAINE IN DEXTROSE 0.75-8.25 % IT SOLN
INTRATHECAL | Status: DC | PRN
Start: 2023-09-27 — End: 2023-10-07
  Administered 2023-09-27: 1.8 mL via INTRATHECAL

## 2023-10-07 NOTE — Anesthesia Procedure Notes (Signed)
 Spinal  Patient location during procedure: OR Start time: 09/27/2023 2:56 PM End time: 09/27/2023 2:58 PM Reason for block: surgical anesthesia Staffing Performed: anesthesiologist  Anesthesiologist: Niels Marien CROME, MD Performed by: Niels Marien CROME, MD Authorized by: Niels Marien CROME, MD   Preanesthetic Checklist Completed: patient identified, IV checked, risks and benefits discussed, surgical consent, monitors and equipment checked, pre-op evaluation and timeout performed Spinal Block Patient position: sitting Prep: DuraPrep and site prepped and draped Patient monitoring: cardiac monitor, continuous pulse ox and blood pressure Approach: midline Location: L3-4 Injection technique: single-shot Needle Needle type: Pencan  Needle gauge: 24 G Needle length: 9 cm Assessment Sensory level: T6 Events: CSF return Additional Notes Functioning IV was confirmed and monitors were applied. Sterile prep and drape, including hand hygiene and sterile gloves were used. The patient was positioned and the spine was prepped. The skin was anesthetized with lidocaine .  Free flow of clear CSF was obtained prior to injecting local anesthetic into the CSF.  The spinal needle aspirated freely following injection.  The needle was carefully withdrawn.  The patient tolerated the procedure well.

## 2023-10-07 NOTE — Anesthesia Postprocedure Evaluation (Signed)
 Anesthesia Post Note  Patient: Michaela Kelley  Procedure(s) Performed: ARTHROPLASTY, HIP, TOTAL, ANTERIOR APPROACH (Left: Hip)     Patient location during evaluation: PACU Anesthesia Type: Spinal Level of consciousness: oriented and awake and alert Pain management: pain level controlled Vital Signs Assessment: post-procedure vital signs reviewed and stable Respiratory status: spontaneous breathing, respiratory function stable and patient connected to nasal cannula oxygen Cardiovascular status: blood pressure returned to baseline and stable Postop Assessment: no headache, no backache and no apparent nausea or vomiting Anesthetic complications: no   No notable events documented.  Last Vitals:  Vitals:   09/28/23 1732 09/28/23 2120  BP: (!) 143/74 (!) 159/80  Pulse: 89 93  Resp: 17 16  Temp: 36.9 C 36.6 C  SpO2: 97% 100%    Last Pain:  Vitals:   09/29/23 0922  TempSrc:   PainSc: 0-No pain   Pain Goal:                   Kimba Lottes L Freddie Nghiem

## 2023-10-10 NOTE — Discharge Summary (Signed)
 Patient ID: Michaela Kelley MRN: [id number] DOB/AGE: 1940/11/14 83 y.o.  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   Cataract    Chronic kidney disease    CKD3   Ganglion, left wrist 05/11/2023   GERD (gastroesophageal reflux disease)    Hx: UTI (urinary tract infection)    Hyperlipidemia    Hypertension    Pain in right hip 09/16/2021   Pain in right knee 06/27/2022   Pain of left hand 04/26/2023   Pain of left hip joint 04/26/2023   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 female who was admitted 09/27/2023 for operative treatment ofOA (osteoarthritis) of hip. Patient has severe unremitting pain that affects sleep, daily activities, and work/hobbies. After pre-op clearance the patient was taken to the operating room on 09/27/2023 and underwent  Procedure(s): ARTHROPLASTY, HIP, TOTAL, ANTERIOR APPROACH.    Patient was given perioperative antibiotics:  Anti-infectives (From admission, onward)    Start     Dose/Rate Route Frequency Ordered Stop   09/28/23 0600  ceFAZolin  (ANCEF ) IVPB 2g/100 mL premix        2 g 200 mL/hr over 30 Minutes Intravenous On call to O.R. 09/27/23 1251 09/28/23 0659   09/27/23 2300  ceFAZolin  (ANCEF ) IVPB 2g/100 mL premix        2 g 200 mL/hr over 30 Minutes Intravenous Every 6 hours 09/27/23 2252 09/28/23 0647        Patient was given sequential compression devices,  early ambulation, and chemoprophylaxis to prevent DVT.  Patient benefited maximally from hospital stay and there were no complications.    Recent vital signs: No data found.   Recent laboratory studies: No results for input(s): WBC, HGB, HCT, PLT, NA, K, CL, CO2, BUN, CREATININE, GLUCOSE, INR, CALCIUM  in the last 72 hours.  Invalid input(s): PT, 2   Discharge Medications:   Allergies as of 09/29/2023       Reactions   Atorvastatin     Myalgias   Codeine Nausea And Vomiting, Other (See Comments)   Severe headaches (tolerates Dilaudid )   Fosamax  [alendronate ] Other (See Comments)   Severe arthrlalgias   Hydrocodone -acetaminophen  Nausea And Vomiting, Other (See Comments)   Severe headaches (tolerates Dilaudid )   Oxycodone -acetaminophen  Nausea And Vomiting, Other (See Comments)   Severe headaches (tolerates Dilaudid )   Voltaren [diclofenac] Rash   Gel        Medication List     STOP taking these medications    aspirin  EC 81 MG tablet Replaced by: Aspirin  Low Dose 81 MG chewable tablet   celecoxib 200 MG capsule Commonly known as: CELEBREX       TAKE these medications    acetaminophen  650 MG CR tablet Commonly known as: TYLENOL  Take 1,300 mg by mouth every 8 (eight) hours as needed for pain.   albuterol  108 (90 Base) MCG/ACT inhaler Commonly known as: VENTOLIN  HFA  Use 2 inhalations  15 minutes Apart  every 4 hours  to Rescue Asthma   Aspirin  Low Dose 81 MG chewable tablet Generic drug: aspirin  Chew 1 tablet (81 mg total) by mouth 2 (two) times daily for 21 days. Then resume one 81 mg aspirin  once a day. Replaces: aspirin  EC 81 MG tablet   b complex vitamins capsule Take 1 capsule by mouth daily.   cyanocobalamin  500 MCG tablet Commonly known as: VITAMIN B12 Take 500 mcg by mouth daily.   cyclobenzaprine  10 MG tablet Commonly known as: FLEXERIL  Take 1 tablet (10 mg total) by mouth 3 (three) times daily as needed for muscle  spasms.   HYDROmorphone  2 MG tablet Commonly known as: DILAUDID  Take 1-2 tablets (2-4 mg total) by mouth every 6 (six) hours as needed for severe pain (pain score 7-10) (pain score 7-10).   MAGNESIUM  PO Take 500 mg by mouth daily.   montelukast  10 MG tablet Commonly known as: SINGULAIR  TAKE ONE TABLET DAILY FOR ALLERGY   olmesartan  40 MG tablet Commonly known as: BENICAR  TAKE ONE TABLET DAILY FOR BLOOD PRESSURE   ondansetron  4 MG tablet Commonly known as: ZOFRAN  Take 1 tablet (4 mg total) by mouth every 6 (six) hours as needed for nausea.   Refresh Tears 0.5 % Soln Generic drug: carboxymethylcellulose Place 1 drop into both eyes 3 (three) times daily as needed (dry eyes).   traMADol  50 MG tablet Commonly known as: ULTRAM  Take 1-2 tablets (50-100 mg total) by mouth every 8 (eight) hours as needed for moderate pain (pain score 4-6).   Trelegy Ellipta  200-62.5-25 MCG/ACT Aepb Generic drug: Fluticasone-Umeclidin-Vilant INHALE ONE INHALATION INTO LUNGS DAILY   TURMERIC CURCUMIN PO Take 750 mg by mouth daily.   Vitamin D  50 MCG (2000 UT) tablet Take 2,000 Units by mouth daily.               Discharge Care Instructions  (From admission, onward)           Start     Ordered   09/28/23 0000  Weight bearing as tolerated        09/28/23 0753   09/28/23 0000  Change dressing       Comments: You have an adhesive waterproof bandage over the incision. Leave this in place until your first follow-up appointment. Once you remove this you will not need to place another bandage.   09/28/23 0753            Diagnostic Studies: DG Pelvis Portable Result Date: 09/27/2023 CLINICAL DATA:  Status post total left hip arthroplasty. EXAM: PORTABLE PELVIS 1-2 VIEWS COMPARISON:  Frontal view of the bilateral hips 01/17/2022 FINDINGS: Interval total left hip arthroplasty. Redemonstration of total right hip arthroplasty. No perihardware lucency is seen to indicate hardware failure or  loosening. Expected postsurgical changes including lateral left hip subcutaneous air. Mild bilateral sacroiliac subchondral sclerosis. The pubic symphysis joint space is maintained. No acute fracture or dislocation. IMPRESSION: Interval total left hip arthroplasty without evidence of hardware failure. Electronically Signed   By: Tanda Lyons M.D.   On: 09/27/2023 17:35   DG HIP UNILAT WITH PELVIS 1V LEFT Result Date: 09/27/2023 CLINICAL DATA:  Elective surgery. EXAM: DG HIP (WITH OR WITHOUT PELVIS) 1V*L* COMPARISON:  None Available. FINDINGS: Three fluoroscopic spot views of the pelvis and left hip obtained in the operating room. Images during hip arthroplasty. Fluoroscopy time 5 seconds. Dose 0.4824 mGy. Previous right hip arthroplasty. IMPRESSION: Intraoperative fluoroscopy during hip arthroplasty. Electronically Signed  By: Andrea Gasman M.D.   On: 09/27/2023 16:20   DG C-Arm 1-60 Min-No Report Result Date: 09/27/2023 Fluoroscopy was utilized by the requesting physician.  No radiographic interpretation.   DG C-Arm 1-60 Min-No Report Result Date: 09/27/2023 Fluoroscopy was utilized by the requesting physician.  No radiographic interpretation.    Disposition: Discharge disposition: 01-Home or Self Care       Discharge Instructions     Call MD / Call 911   Complete by: As directed    If you experience chest pain or shortness of breath, CALL 911 and be transported to the hospital emergency room.  If you develope a fever above 101 F, pus (white drainage) or increased drainage or redness at the wound, or calf pain, call your surgeon's office.   Change dressing   Complete by: As directed    You have an adhesive waterproof bandage over the incision. Leave this in place until your first follow-up appointment. Once you remove this you will not need to place another bandage.   Constipation Prevention   Complete by: As directed    Drink plenty of fluids.  Prune juice may be helpful.  You may  use a stool softener, such as Colace (over the counter) 100 mg twice a day.  Use MiraLax  (over the counter) for constipation as needed.   Diet - low sodium heart healthy   Complete by: As directed    Do not sit on low chairs, stoools or toilet seats, as it may be difficult to get up from low surfaces   Complete by: As directed    Driving restrictions   Complete by: As directed    No driving for two weeks   Post-operative opioid taper instructions:   Complete by: As directed    POST-OPERATIVE OPIOID TAPER INSTRUCTIONS: It is important to wean off of your opioid medication as soon as possible. If you do not need pain medication after your surgery it is ok to stop day one. Opioids include: Codeine, Hydrocodone (Norco, Vicodin), Oxycodone (Percocet, oxycontin ) and hydromorphone  amongst others.  Long term and even short term use of opiods can cause: Increased pain response Dependence Constipation Depression Respiratory depression And more.  Withdrawal symptoms can include Flu like symptoms Nausea, vomiting And more Techniques to manage these symptoms Hydrate well Eat regular healthy meals Stay active Use relaxation techniques(deep breathing, meditating, yoga) Do Not substitute Alcohol  to help with tapering If you have been on opioids for less than two weeks and do not have pain than it is ok to stop all together.  Plan to wean off of opioids This plan should start within one week post op of your joint replacement. Maintain the same interval or time between taking each dose and first decrease the dose.  Cut the total daily intake of opioids by one tablet each day Next start to increase the time between doses. The last dose that should be eliminated is the evening dose.      TED hose   Complete by: As directed    Use stockings (TED hose) for three weeks on both leg(s).  You may remove them at night for sleeping.   Weight bearing as tolerated   Complete by: As directed          Follow-up Information     Melodi Lerner, MD. Go on 10/10/2023.   Specialty: Orthopedic Surgery Why: You are scheduled for a post op appointment on Tuesday 10/10/23 at 4:45pm Contact information: [address] [place]  [place] [address] [phone]                  Signed: Roxie Kelley 10/10/2023, 12:31 PM

## 2023-10-12 NOTE — Addendum Note (Signed)
 Addended by: Jream Broyles E on: 10/12/2023 10:35 AM   Modules accepted: Orders

## 2023-10-18 ENCOUNTER — Inpatient Hospital Stay: Admission: RE | Admit: 2023-10-18 | Source: Ambulatory Visit

## 2023-11-14 DIAGNOSIS — Z5189 Encounter for other specified aftercare: Secondary | ICD-10-CM | POA: Diagnosis not present

## 2023-11-16 ENCOUNTER — Ambulatory Visit: Payer: Self-pay | Admitting: Family Medicine

## 2023-11-16 ENCOUNTER — Other Ambulatory Visit: Payer: Self-pay | Admitting: Family Medicine

## 2023-11-16 ENCOUNTER — Ambulatory Visit (INDEPENDENT_AMBULATORY_CARE_PROVIDER_SITE_OTHER)
Admission: RE | Admit: 2023-11-16 | Discharge: 2023-11-16 | Disposition: A | Discharge status: Home / Self Care | Source: Ambulatory Visit | Attending: Family Medicine | Admitting: Family Medicine

## 2023-11-16 DIAGNOSIS — M810 Age-related osteoporosis without current pathological fracture: Secondary | ICD-10-CM

## 2023-11-16 DIAGNOSIS — E2839 Other primary ovarian failure: Secondary | ICD-10-CM | POA: Diagnosis not present

## 2023-11-23 DIAGNOSIS — M1711 Unilateral primary osteoarthritis, right knee: Secondary | ICD-10-CM | POA: Diagnosis not present

## 2023-12-05 DIAGNOSIS — L821 Other seborrheic keratosis: Secondary | ICD-10-CM | POA: Diagnosis not present

## 2023-12-05 DIAGNOSIS — L570 Actinic keratosis: Secondary | ICD-10-CM | POA: Diagnosis not present

## 2023-12-05 DIAGNOSIS — L438 Other lichen planus: Secondary | ICD-10-CM | POA: Diagnosis not present

## 2023-12-05 DIAGNOSIS — L815 Leukoderma, not elsewhere classified: Secondary | ICD-10-CM | POA: Diagnosis not present

## 2023-12-05 DIAGNOSIS — L2089 Other atopic dermatitis: Secondary | ICD-10-CM | POA: Diagnosis not present

## 2023-12-05 DIAGNOSIS — D225 Melanocytic nevi of trunk: Secondary | ICD-10-CM | POA: Diagnosis not present

## 2023-12-05 DIAGNOSIS — L814 Other melanin hyperpigmentation: Secondary | ICD-10-CM | POA: Diagnosis not present

## 2023-12-05 DIAGNOSIS — B078 Other viral warts: Secondary | ICD-10-CM | POA: Diagnosis not present

## 2023-12-05 DIAGNOSIS — D1801 Hemangioma of skin and subcutaneous tissue: Secondary | ICD-10-CM | POA: Diagnosis not present

## 2023-12-27 ENCOUNTER — Encounter: Payer: Self-pay | Admitting: Family Medicine

## 2023-12-27 ENCOUNTER — Ambulatory Visit (INDEPENDENT_AMBULATORY_CARE_PROVIDER_SITE_OTHER): Admitting: Family Medicine

## 2023-12-27 VITALS — BP 100/70 | HR 81 | Temp 97.4°F | Ht 62.0 in | Wt 143.0 lb

## 2023-12-27 DIAGNOSIS — G5602 Carpal tunnel syndrome, left upper limb: Secondary | ICD-10-CM | POA: Diagnosis not present

## 2023-12-27 DIAGNOSIS — J4520 Mild intermittent asthma, uncomplicated: Secondary | ICD-10-CM | POA: Diagnosis not present

## 2023-12-27 DIAGNOSIS — M1711 Unilateral primary osteoarthritis, right knee: Secondary | ICD-10-CM | POA: Diagnosis not present

## 2023-12-27 MED ORDER — ALBUTEROL SULFATE HFA 108 (90 BASE) MCG/ACT IN AERS: 2.0000 | INHALATION_SPRAY | Freq: Four times a day (QID) | RESPIRATORY_TRACT | 0 refills | Status: AC | PRN

## 2023-12-27 NOTE — Patient Instructions (Signed)
 You can call Dr. Mardella office and schedule an appointment since you have been a patient there in the past.

## 2023-12-27 NOTE — Progress Notes (Signed)
 Subjective:     Patient ID: Michaela Kelley, female    DOB: Mar 04, 1941, 83 y.o.   MRN: [id number]  Chief Complaint  Patient presents with   Acute Visit    Left hand fells like its asleep, has some numbness, tingling, and pain ongoing 2 weeks, worse last 6 days    HPI  Discussed the use of AI scribe software for clinical note transcription with the patient, who gave verbal consent to proceed.  History of Present Illness Michaela Kelley is an 83 year old female who presents with left hand pain and numbness.  Left hand paresthesia and pain - Pain and numbness in three fingers of the left hand for the past three weeks - Symptoms have worsened over the past weekend - Nocturnal awakening due to symptoms - Sensation described as the hand 'going to sleep' followed by intense pain - Pain localized to the hand without radiation down the arm - No daytime numbness or tingling - Ganglion cyst present in the area of symptoms - No prior episodes of similar symptoms  Musculoskeletal support and activity - Uses a wrist brace during activities such as golf or exercise - Participates in exercise classes  Medication intolerances and pain management - Takes Celebrex as needed for right knee pain - Avoids tramadol  due to adverse effects   Allergic and respiratory symptoms - History of allergies and asthma - Managed with montelukast  and over-the-counter antihistamine - No recent use of Trelegy inhaler or albuterol       Health Maintenance Due  Topic Date Due   Zoster Vaccines- Shingrix (1 of 2) Never done   DTaP/Tdap/Td (2 - Tdap) 05/26/2021   Influenza Vaccine  10/27/2023   COVID-19 Vaccine (4 - 2025-26 season) 11/27/2023    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   Cataract    Chronic kidney disease    CKD3   Ganglion, left wrist 05/11/2023   GERD  (gastroesophageal reflux disease)    Hx: UTI (urinary tract infection)    Hyperlipidemia    Hypertension    Pain in right hip 09/16/2021   Pain in right knee 06/27/2022   Pain of left hand 04/26/2023   Pain of left hip joint 04/26/2023   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   Prediabetes    Seasonal allergic rhinitis    Vitamin D  deficiency     Past Surgical History:  Procedure Laterality Date   BACK SURGERY  1999   L5    DILATION AND CURETTAGE OF UTERUS     KNEE ARTHROPLASTY  2010   L knee   TONSILLECTOMY     TOTAL HIP ARTHROPLASTY Right 01/17/2022   Procedure: TOTAL HIP ARTHROPLASTY ANTERIOR APPROACH;  Surgeon: Melodi Lerner, MD;  Location: [facility];  Service: Orthopedics;  Laterality: Right;   TOTAL HIP ARTHROPLASTY Left 09/27/2023   Procedure: ARTHROPLASTY, HIP, TOTAL, ANTERIOR APPROACH;  Surgeon: Melodi Lerner, MD;  Location: [facility];  Service: Orthopedics;  Laterality: Left;   TOTAL SHOULDER ARTHROPLASTY  02/10/2011   Procedure: TOTAL SHOULDER ARTHROPLASTY;  Surgeon: Franky HERO Supple;  Location: [facility];  Service: Orthopedics;  Laterality: Left;  TOTAL SHOULDER ARTHROPLASTY LEFT SIDE   TOTAL SHOULDER ARTHROPLASTY  12/22/2011   Procedure: TOTAL SHOULDER ARTHROPLASTY;  Surgeon: Franky HERO Pointer, MD;  Location: [facility];  Service:  Orthopedics;  Laterality: Right;  right total shoulder arthroplasty    Family History  Problem Relation Age of Onset   Breast cancer Mother 38   Hypertension Father    Heart disease Brother    Esophageal cancer Brother    Heart disease Maternal Grandmother    Heart disease Maternal Grandfather    Breast cancer Paternal Grandmother    Diabetes Paternal Grandfather    Colon cancer Neg Hx    Rectal cancer Neg Hx    Stomach cancer Neg Hx     Social History   Socioeconomic History   Marital status: Widowed    Spouse name: Not on file   Number of children: 2   Years of education: Not on file   Highest education level: Some college, no  degree  Occupational History   Not on file  Tobacco Use   Smoking status: Former    Current packs/day: 0.00    Average packs/day: 0.3 packs/day for 20.0 years (5.0 ttl pk-yrs)    Types: Cigarettes    Start date: 12/16/1971    Quit date: 12/16/1991    Years since quitting: 32.0   Smokeless tobacco: Never  Vaping Use   Vaping status: Never Used  Substance and Sexual Activity   Alcohol  use: Yes    Alcohol /week: 8.0 standard drinks of alcohol     Types: 8 Glasses of wine per week    Comment: 6-8 glasses per week    Drug use: No   Sexual activity: Not Currently    Partners: Male    Birth control/protection: Post-menopausal  Other Topics Concern   Not on file  Social History Narrative   Not on file   Social Drivers of Health   Financial Resource Strain: Low Risk  (09/20/2023)   Overall Financial Resource Strain (CARDIA)    Difficulty of Paying Living Expenses: Not hard at all  Food Insecurity: No Food Insecurity (09/28/2023)   Hunger Vital Sign    Worried About Running Out of Food in the Last Year: Never true    Ran Out of Food in the Last Year: Never true  Transportation Needs: No Transportation Needs (09/28/2023)   PRAPARE - Administrator, Civil Service (Medical): No    Lack of Transportation (Non-Medical): No  Physical Activity: Inactive (09/20/2023)   Exercise Vital Sign    Days of Exercise per Week: 0 days    Minutes of Exercise per Session: 0 min  Stress: No Stress Concern Present (09/20/2023)   [employer] of Occupational Health - Occupational Stress Questionnaire    Feeling of Stress: Not at all  Social Connections: Moderately Integrated (09/28/2023)   Social Connection and Isolation Panel    Frequency of Communication with Friends and Family: More than three times a week    Frequency of Social Gatherings with Friends and Family: More than three times a week    Attends Religious Services: More than 4 times per year    Active Member of [employer] or  Organizations: Yes    Attends Banker Meetings: More than 4 times per year    Marital Status: Widowed  Intimate Partner Violence: Not At Risk (09/28/2023)   Humiliation, Afraid, Rape, and Kick questionnaire    Fear of Current or Ex-Partner: No    Emotionally Abused: No    Physically Abused: No    Sexually Abused: No    Outpatient Medications Prior to Visit  Medication Sig Dispense Refill   acetaminophen  (TYLENOL ) 650 MG CR tablet  Take 1,300 mg by mouth every 8 (eight) hours as needed for pain.     b complex vitamins capsule Take 1 capsule by mouth daily.     carboxymethylcellulose (REFRESH TEARS) 0.5 % SOLN Place 1 drop into both eyes 3 (three) times daily as needed (dry eyes).     celecoxib (CELEBREX) 200 MG capsule Take 200 mg by mouth daily as needed.     Cholecalciferol  (VITAMIN D ) 50 MCG (2000 UT) tablet Take 2,000 Units by mouth daily.     cyanocobalamin  (VITAMIN B12) 500 MCG tablet Take 500 mcg by mouth daily.     MAGNESIUM  PO Take 500 mg by mouth daily.     montelukast  (SINGULAIR ) 10 MG tablet TAKE ONE TABLET DAILY FOR ALLERGY 90 tablet 3   olmesartan  (BENICAR ) 40 MG tablet TAKE ONE TABLET DAILY FOR BLOOD PRESSURE 90 tablet 1   TURMERIC CURCUMIN PO Take 750 mg by mouth daily.     albuterol  (VENTOLIN  HFA) 108 (90 Base) MCG/ACT inhaler Use 2 inhalations  15 minutes Apart  every 4 hours  to Rescue Asthma (Patient not taking: Reported on 12/27/2023) 48 g 3   cyclobenzaprine  (FLEXERIL ) 10 MG tablet Take 1 tablet (10 mg total) by mouth 3 (three) times daily as needed for muscle spasms. 30 tablet 0   HYDROmorphone  (DILAUDID ) 2 MG tablet Take 1-2 tablets (2-4 mg total) by mouth every 6 (six) hours as needed for severe pain (pain score 7-10) (pain score 7-10). 42 tablet 0   ondansetron  (ZOFRAN ) 4 MG tablet Take 1 tablet (4 mg total) by mouth every 6 (six) hours as needed for nausea. 20 tablet 0   traMADol  (ULTRAM ) 50 MG tablet Take 1-2 tablets (50-100 mg total) by mouth every 8  (eight) hours as needed for moderate pain (pain score 4-6). 40 tablet 0   TRELEGY ELLIPTA  200-62.5-25 MCG/ACT AEPB INHALE ONE INHALATION INTO LUNGS DAILY (Patient not taking: Reported on 12/27/2023) 60 each 2   No facility-administered medications prior to visit.    Allergies  Allergen Reactions   Atorvastatin      Myalgias    Codeine Nausea And Vomiting and Other (See Comments)    Severe headaches (tolerates Dilaudid )    Fosamax  [Alendronate ] Other (See Comments)    Severe arthrlalgias   Hydrocodone -Acetaminophen  Nausea And Vomiting and Other (See Comments)    Severe headaches (tolerates Dilaudid )   Oxycodone -Acetaminophen  Nausea And Vomiting and Other (See Comments)    Severe headaches (tolerates Dilaudid )   Voltaren [Diclofenac] Rash    Gel    ROS Per HPI     Objective:    Physical Exam Constitutional:      General: She is not in acute distress.    Appearance: She is not ill-appearing.  HENT:     Mouth/Throat:     Mouth: Mucous membranes are moist.     Pharynx: Oropharynx is clear.  Eyes:     Extraocular Movements: Extraocular movements intact.     Conjunctiva/sclera: Conjunctivae normal.     Pupils: Pupils are equal, round, and reactive to light.  Cardiovascular:     Rate and Rhythm: Normal rate and regular rhythm.  Pulmonary:     Effort: Pulmonary effort is normal.     Breath sounds: Normal breath sounds.  Musculoskeletal:     Cervical back: Normal range of motion and neck supple.     Right lower leg: No edema.     Left lower leg: No edema.  Skin:    General: Skin is warm  and dry.  Neurological:     General: No focal deficit present.     Mental Status: She is alert and oriented to person, place, and time.     Motor: No weakness.     Coordination: Coordination normal.     Gait: Gait normal.  Psychiatric:        Mood and Affect: Mood normal.        Behavior: Behavior normal.        Thought Content: Thought content normal.      BP 100/70 (BP Location:  Left Arm, Patient Position: Sitting)   Pulse 81   Temp (!) 97.4 F (36.3 C) (Temporal)   Ht 5' 2 (1.575 m)   Wt 143 lb (64.9 kg)   SpO2 98%   BMI 26.16 kg/m  Wt Readings from Last 3 Encounters:  12/27/23 143 lb (64.9 kg)  09/28/23 143 lb 4.8 oz (65 kg)  09/20/23 141 lb (64 kg)       Assessment & Plan:   Problem List Items Addressed This Visit     Mild intermittent asthma without complication - Primary   Relevant Medications   albuterol  (VENTOLIN  HFA) 108 (90 Base) MCG/ACT inhaler   Osteoarthritis of right knee   Relevant Medications   celecoxib (CELEBREX) 200 MG capsule   Other Visit Diagnoses       Carpal tunnel syndrome of left wrist       Relevant Orders   Ambulatory referral to Hand Surgery       Assessment and Plan Assessment & Plan Carpal tunnel syndrome, left hand Symptoms include pain and numbness, particularly at night, likely due to CTS No weakness observed, and symptoms are not present during the day. Symptoms have been present for approximately three weeks and have worsened recently. - Refer to hand specialist, Dr. Camella, for further evaluation. - Advise use of an over-the-counter wrist brace to keep the wrist straight at night. - Recommend use of anti-inflammatory medication, such as Celebrex, for pain management. She has this medication at home  - Suggest topical treatment for symptomatic relief.  Essential hypertension Currently managed with olmesartan .  Environmental and seasonal allergies and mild intermittent asthma Allergy symptoms managed with montelukast  and over-the-counter antihistamine ([place] brand equivalent to Zyrtec). Asthma symptoms well-controlled, with no recent use of Trelegy or albuterol  inhalers. Trelegy is noted to be expensive and not used. - Discontinue Trelegy from the medication list. - Prescribe a new albuterol  inhaler to replace the expired one.  Osteoarthritis, right knee Intermittent right knee pain managed with  Celebrex as needed, particularly after exercise.     I have discontinued Ruben E. Calloway's albuterol , Trelegy Ellipta , HYDROmorphone , traMADol , ondansetron , and cyclobenzaprine . I am also having her start on albuterol . Additionally, I am having her maintain her MAGNESIUM  PO, TURMERIC CURCUMIN PO, Vitamin D , b complex vitamins, acetaminophen , carboxymethylcellulose, montelukast , cyanocobalamin , olmesartan , and celecoxib.  Meds ordered this encounter  Medications   albuterol  (VENTOLIN  HFA) 108 (90 Base) MCG/ACT inhaler    Sig: Inhale 2 puffs into the lungs every 6 (six) hours as needed for wheezing or shortness of breath.    Dispense:  8 g    Refill:  0    Supervising Provider:   ROLLENE NORRIS A [[id number]]

## 2024-01-01 ENCOUNTER — Encounter: Payer: Self-pay | Admitting: Physician Assistant

## 2024-01-01 ENCOUNTER — Ambulatory Visit: Admitting: Physician Assistant

## 2024-01-01 VITALS — Ht 61.0 in | Wt 140.8 lb

## 2024-01-01 DIAGNOSIS — M810 Age-related osteoporosis without current pathological fracture: Secondary | ICD-10-CM

## 2024-01-01 NOTE — Progress Notes (Signed)
 Office Visit Note   Patient: Michaela Kelley           Date of Birth: October 18, 1940           MRN: [id number] Visit Date: 01/01/2024              Requested by: Lendia Boby CROME, NP-C [address] [place],  [place] [address] PCP: Lendia Boby CROME, NP-C   Assessment & Plan: Visit Diagnoses:  1. Age-related osteoporosis without current pathological fracture     Plan: Patient is an 83 year old woman who is referred by Orie Lendia.  For evaluation of osteoporosis.  She was previously on Fosamax  for few years but had difficulty tolerating the medication because of the extreme bone pain.  She also has the same problem with statin medications.  She has had hip replacements and the left being most recent in 2025 she did say she had a hip fracture associated with this.  She also did have advanced osteoarthritis.  She has no history of heart disease or cancer.  She does not think she has history of kidney disease though her practitioner said she may have early disease.  Her filtration rate is fairly good she has no history of reflux gastric bypass or seizures.  She went through menopause in her mid 71s did not do hormone replacement.  She she takes 1000 mg of calcium  and supplements adequately with vitamin D .  She is a former smoker quit many many years ago she drinks 12 alcoholic beverages per week she does both balance and resistance training exercises.  She has had dental work but implants without any difficulty she does not have any family history of hip or spine fracture.  She had a bone density scan which was somewhat limited because of her hip replacements.  She had adequate bone density of her lumbar spine at +0.6 but at her radius was -3.645 minutes was spent reviewing her chart and talking to her about options.  She is very concerned about the myalgias because she seems to be very sensitive to that.  That prohibited her from taking Fosamax .  I did tell her that all these medications can have that  side effect she is going to discuss this with her family.  I think a year of Evenity may be the best option for her as it would be more easily discontinued if she had side effects.  She is gena discuss this with her daughter side effects of all the medications were discussed.  She will contact us  if she wishes to proceed  Follow-Up Instructions: No follow-ups on file.   Orders:  No orders of the defined types were placed in this encounter.  No orders of the defined types were placed in this encounter.     Procedures: No procedures performed   Clinical Data: No additional findings.   Subjective: No chief complaint on file.   HPI Pleasant active 83 year old woman referred by Orie Lendia for evaluation of osteoporosis Review of Systems  All other systems reviewed and are negative.    Objective: Vital Signs: Ht 5' 1 (1.549 m)   Wt 140 lb 12.8 oz (63.9 kg)   BMI 26.60 kg/m   Physical Exam Constitutional:      Appearance: Normal appearance.  Pulmonary:     Effort: Pulmonary effort is normal.  Skin:    General: Skin is warm and dry.  Neurological:     General: No focal deficit present.     Mental  Status: She is alert and oriented to person, place, and time.  Psychiatric:        Mood and Affect: Mood normal.        Behavior: Behavior normal.      Specialty Comments:  No specialty comments available.  Imaging: No results found.   PMFS History: Patient Active Problem List   Diagnosis Date Noted   Primary osteoarthritis of left hip 09/27/2023   Cerumen impaction 09/01/2023   Bilateral hearing loss 09/01/2023   Chronic hyponatremia 06/22/2023   Anemia 06/22/2023   Mild intermittent asthma without complication 06/22/2023   Fatigue 06/22/2023   Environmental and seasonal allergies 06/22/2023   OA (osteoarthritis) of hip 01/17/2022   History of colon polyps 10/01/2021   Asymptomatic varicose veins of left lower extremity 10/01/2021   B12 deficiency  02/12/2020   Diverticulosis 01/15/2020   Pelvic congestion syndrome 01/15/2020   Osteoarthritis of right knee 07/03/2019   Age-related osteoporosis without current pathological fracture 02/01/2018   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   Seasonal allergic rhinitis    Vitamin D  deficiency    Hyperlipidemia, mixed 01/02/2008   Essential hypertension 01/02/2008   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   Cataract    Chronic kidney disease    CKD3   Ganglion, left wrist 05/11/2023   GERD (gastroesophageal reflux disease)    Hx: UTI (urinary tract infection)    Hyperlipidemia    Hypertension    Pain in right hip 09/16/2021   Pain in right knee 06/27/2022   Pain of left hand 04/26/2023   Pain of left hip joint 04/26/2023   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   Prediabetes    Seasonal allergic rhinitis    Vitamin D  deficiency     Family History  Problem Relation Age of Onset   Breast cancer Mother 74   Hypertension Father    Heart disease Brother    Esophageal cancer Brother    Heart disease Maternal Grandmother    Heart disease Maternal Grandfather    Breast cancer Paternal Grandmother    Diabetes Paternal Grandfather    Colon cancer Neg Hx    Rectal cancer Neg Hx    Stomach cancer Neg Hx     Past Surgical History:  Procedure Laterality Date   BACK SURGERY  1999   L5    DILATION AND CURETTAGE OF UTERUS     KNEE ARTHROPLASTY  2010   L knee   TONSILLECTOMY     TOTAL HIP ARTHROPLASTY Right 01/17/2022   Procedure: TOTAL HIP ARTHROPLASTY ANTERIOR APPROACH;  Surgeon: Melodi Lerner, MD;  Location: [facility];  Service: Orthopedics;  Laterality: Right;   TOTAL HIP ARTHROPLASTY Left 09/27/2023   Procedure: ARTHROPLASTY, HIP, TOTAL, ANTERIOR APPROACH;  Surgeon: Melodi Lerner, MD;  Location: [facility];  Service: Orthopedics;  Laterality: Left;   TOTAL  SHOULDER ARTHROPLASTY  02/10/2011   Procedure: TOTAL SHOULDER ARTHROPLASTY;  Surgeon: Franky HERO Supple;  Location: [facility];  Service: Orthopedics;  Laterality: Left;  TOTAL SHOULDER ARTHROPLASTY LEFT SIDE   TOTAL SHOULDER ARTHROPLASTY  12/22/2011   Procedure: TOTAL SHOULDER ARTHROPLASTY;  Surgeon: Franky HERO Pointer, MD;  Location: [facility];  Service: Orthopedics;  Laterality: Right;  right total shoulder arthroplasty   Social History   Occupational History   Not on file  Tobacco Use  Smoking status: Former    Current packs/day: 0.00    Average packs/day: 0.3 packs/day for 20.0 years (5.0 ttl pk-yrs)    Types: Cigarettes    Start date: 12/16/1971    Quit date: 12/16/1991    Years since quitting: 32.0   Smokeless tobacco: Never  Vaping Use   Vaping status: Never Used  Substance and Sexual Activity   Alcohol  use: Yes    Alcohol /week: 8.0 standard drinks of alcohol     Types: 8 Glasses of wine per week    Comment: 6-8 glasses per week    Drug use: No   Sexual activity: Not Currently    Partners: Male    Birth control/protection: Post-menopausal

## 2024-01-17 DIAGNOSIS — G5602 Carpal tunnel syndrome, left upper limb: Secondary | ICD-10-CM | POA: Diagnosis not present

## 2024-01-17 DIAGNOSIS — M13842 Other specified arthritis, left hand: Secondary | ICD-10-CM | POA: Diagnosis not present

## 2024-01-17 DIAGNOSIS — M67432 Ganglion, left wrist: Secondary | ICD-10-CM | POA: Diagnosis not present

## 2024-01-18 DIAGNOSIS — M1711 Unilateral primary osteoarthritis, right knee: Secondary | ICD-10-CM | POA: Diagnosis not present

## 2024-01-18 DIAGNOSIS — M25561 Pain in right knee: Secondary | ICD-10-CM | POA: Diagnosis not present

## 2024-01-29 ENCOUNTER — Encounter: Payer: Self-pay | Admitting: Radiology

## 2024-02-13 ENCOUNTER — Ambulatory Visit: Payer: Self-pay

## 2024-02-13 ENCOUNTER — Ambulatory Visit: Admitting: Emergency Medicine

## 2024-02-13 ENCOUNTER — Encounter: Payer: Self-pay | Admitting: Emergency Medicine

## 2024-02-13 VITALS — BP 140/80 | HR 69 | Temp 97.6°F | Ht 61.0 in | Wt 145.0 lb

## 2024-02-13 DIAGNOSIS — J22 Unspecified acute lower respiratory infection: Secondary | ICD-10-CM

## 2024-02-13 DIAGNOSIS — R051 Acute cough: Secondary | ICD-10-CM | POA: Diagnosis not present

## 2024-02-13 MED ORDER — AMOXICILLIN-POT CLAVULANATE 875-125 MG PO TABS: 1.0000 | ORAL_TABLET | Freq: Two times a day (BID) | ORAL | 0 refills | Status: AC

## 2024-02-13 MED ORDER — DM-GUAIFENESIN ER 30-600 MG PO TB12: 1.0000 | ORAL_TABLET | Freq: Two times a day (BID) | ORAL | 1 refills | Status: AC

## 2024-02-13 NOTE — Assessment & Plan Note (Signed)
 Clinically stable.  No red flag signs or symptoms. No clinical findings of pneumonia. Upper respiratory viral infection now with secondary bacterial infection. Recommend Augmentin 875 mg twice a day for 7 days Advised to rest and stay well-hydrated Symptom management discussed Advised to contact the office if no better or worse during the next several days. Recommend follow-up with PCP.

## 2024-02-13 NOTE — Assessment & Plan Note (Signed)
 Cough management discussed Recommend over-the-counter Mucinex DM twice a day along with cough drops Advised to rest and stay well-hydrated May take NyQuil at bedtime Advised to contact the office if no better or worse during the next several days.

## 2024-02-13 NOTE — Patient Instructions (Signed)
 Acute Bronchitis, Adult  Acute bronchitis is when air tubes in the lungs (bronchi) suddenly get swollen. The condition can make it hard for you to breathe. In adults, acute bronchitis usually goes away within 2 weeks. A cough caused by bronchitis may last up to 3 weeks. Smoking, allergies, and asthma can make the condition worse. What are the causes? Germs that cause cold and flu (viruses). The most common cause of this condition is the virus that causes the common cold. Bacteria. Substances that bother (irritate) the lungs, including: Smoke from cigarettes and other types of tobacco. Dust and pollen. Fumes from chemicals, gases, or burned fuel. Indoor or outdoor air pollution. What increases the risk? A weak body's defense system. This is also called the immune system. Any condition that affects your lungs and breathing, such as asthma. What are the signs or symptoms? A cough. Coughing up clear, yellow, or green mucus. Making high-pitched whistling sounds when you breathe, most often when you breathe out (wheezing). Runny or stuffy nose. Having too much mucus in your lungs (chest congestion). Shortness of breath. Body aches. A sore throat. How is this treated? Acute bronchitis may go away over time without treatment. Your doctor may tell you to: Drink more fluids. This will help thin your mucus so it is easier to cough up. Use a device that gets medicine into your lungs (inhaler). Use a vaporizer or a humidifier. These are machines that add water to the air. This helps with coughing and poor breathing. Take a medicine that thins mucus and helps clear it from your lungs. Take a medicine that prevents or stops coughing. It is not common to take an antibiotic medicine for this condition. Follow these instructions at home:  Take over-the-counter and prescription medicines only as told by your doctor. Use an inhaler, vaporizer, or humidifier as told by your doctor. Take two teaspoons  (10 mL) of honey at bedtime. This helps lessen your coughing at night. Drink enough fluid to keep your pee (urine) pale yellow. Do not smoke or use any products that contain nicotine or tobacco. If you need help quitting, ask your doctor. Get a lot of rest. Return to your normal activities when your doctor says that it is safe. Keep all follow-up visits. How is this prevented?  Wash your hands often with soap and water for at least 20 seconds. If you cannot use soap and water, use hand sanitizer. Avoid contact with people who have cold symptoms. Try not to touch your mouth, nose, or eyes with your hands. Avoid breathing in smoke or chemical fumes. Make sure to get the flu shot every year. Contact a doctor if: Your symptoms do not get better in 2 weeks. You have trouble coughing up the mucus. Your cough keeps you awake at night. You have a fever. Get help right away if: You cough up blood. You have chest pain. You have very bad shortness of breath. You faint or keep feeling like you are going to faint. You have a very bad headache. Your fever or chills get worse. These symptoms may be an emergency. Get help right away. Call your local emergency services (911 in the [place] Do not wait to see if the symptoms will go away. Do not drive yourself to the hospital. Summary Acute bronchitis is when air tubes in the lungs (bronchi) suddenly get swollen. In adults, acute bronchitis usually goes away within 2 weeks. Drink more fluids. This will help thin your mucus so it is easier  to cough up. Take over-the-counter and prescription medicines only as told by your doctor. Contact a doctor if your symptoms do not improve after 2 weeks of treatment. This information is not intended to replace advice given to you by your health care provider. Make sure you discuss any questions you have with your health care provider. Document Revised: 07/15/2020 Document Reviewed: 07/15/2020 Elsevier Patient  Education  2024 [employer].

## 2024-02-13 NOTE — Telephone Encounter (Signed)
 FYI Only or Action Required?: FYI only for provider: appointment scheduled on 02/13/2024.  Patient was last seen in primary care on 12/27/2023 by Lendia Boby CROME, NP-C.  Called Nurse Triage reporting Cough.  Symptoms began Saturday.  Interventions attempted: Rest, hydration, or home remedies.  Symptoms are: gradually worsening.  Triage Disposition: See Physician Within 24 Hours  Patient/caregiver understands and will follow disposition?: Yes  Copied from CRM #[id number]. Topic: Clinical - Red Word Triage >> Feb 13, 2024  7:52 AM Deaijah H wrote: Red Word that prompted transfer to Nurse Triage: Cold since Saturday, mucus has become thick kind of green. Believes she has an infection. Reason for Disposition  SEVERE coughing spells (e.g., whooping sound after coughing, vomiting after coughing)  Answer Assessment - Initial Assessment Questions 1. ONSET: When did the cough begin?      Saturday 2. SEVERITY: How bad is the cough today?      moderate 3. SPUTUM: Describe the color of your sputum (e.g., none, dry cough; clear, white, yellow, green)     green 4. HEMOPTYSIS: Are you coughing up any blood? If Yes, ask: How much? (e.g., flecks, streaks, tablespoons, etc.)     no 5. DIFFICULTY BREATHING: Are you having difficulty breathing? If Yes, ask: How bad is it? (e.g., mild, moderate, severe)      no 6. FEVER: Do you have a fever? If Yes, ask: What is your temperature, how was it measured, and when did it start?     Low grade  7. CARDIAC HISTORY: Do you have any history of heart disease? (e.g., heart attack, congestive heart failure)      no 8. LUNG HISTORY: Do you have any history of lung disease?  (e.g., pulmonary embolus, asthma, emphysema)     no 9. PE RISK FACTORS: Do you have a history of blood clots? (or: recent major surgery, recent prolonged travel, bedridden)     na 10. OTHER SYMPTOMS: Do you have any other symptoms? (e.g., runny nose, wheezing, chest  pain)       Congestion, runny nose, hoarseness 11. PREGNANCY: Is there any chance you are pregnant? When was your last menstrual period?       na 12. TRAVEL: Have you traveled out of the country in the last month? (e.g., travel history, exposures)       na  Protocols used: Cough - Acute Productive-A-AH

## 2024-02-13 NOTE — Progress Notes (Signed)
 Michaela Kelley 83 y.o.   Chief Complaint  Patient presents with   Cough    Congestion, pt states that this has been going on since saturday only thing she has taken OTC is tylenol      HISTORY OF PRESENT ILLNESS: This is a 83 y.o. female complaining of flulike symptoms and productive cough that started 4 days ago.  Progressively getting worse. Had fever.  Green phlegm. No other associated symptoms. No other complaints or medical concerns today. Tested negative for COVID at home.  Cough Associated symptoms include a fever and a sore throat. Pertinent negatives include no chest pain, headaches or shortness of breath.     Prior to Admission medications   Medication Sig Start Date End Date Taking? Authorizing Provider  acetaminophen  (TYLENOL ) 650 MG CR tablet Take 1,300 mg by mouth every 8 (eight) hours as needed for pain.    [provider]  albuterol  (VENTOLIN  HFA) 108 (90 Base) MCG/ACT inhaler Inhale 2 puffs into the lungs every 6 (six) hours as needed for wheezing or shortness of breath. 12/27/23   Henson, Vickie L, NP-C  b complex vitamins capsule Take 1 capsule by mouth daily.    [provider]  carboxymethylcellulose (REFRESH TEARS) 0.5 % SOLN Place 1 drop into both eyes 3 (three) times daily as needed (dry eyes).    [provider]  celecoxib (CELEBREX) 200 MG capsule Take 200 mg by mouth daily as needed. 11/17/23   [provider]  Cholecalciferol  (VITAMIN D ) 50 MCG (2000 UT) tablet Take 2,000 Units by mouth daily.    [provider]  cyanocobalamin  (VITAMIN B12) 500 MCG tablet Take 500 mcg by mouth daily.    [provider]  MAGNESIUM  PO Take 500 mg by mouth daily.    [provider]  montelukast  (SINGULAIR ) 10 MG tablet TAKE ONE TABLET DAILY FOR ALLERGY 06/22/23   Henson, Vickie L, NP-C  olmesartan  (BENICAR ) 40 MG tablet TAKE ONE TABLET DAILY FOR BLOOD PRESSURE 09/20/23   Henson, Vickie L, NP-C  TURMERIC CURCUMIN PO  Take 750 mg by mouth daily.    [provider]    Allergies  Allergen Reactions   Atorvastatin      Myalgias    Codeine Nausea And Vomiting and Other (See Comments)    Severe headaches (tolerates Dilaudid )    Fosamax  [Alendronate ] Other (See Comments)    Severe arthrlalgias   Hydrocodone -Acetaminophen  Nausea And Vomiting and Other (See Comments)    Severe headaches (tolerates Dilaudid )   Oxycodone -Acetaminophen  Nausea And Vomiting and Other (See Comments)    Severe headaches (tolerates Dilaudid )   Voltaren [Diclofenac] Rash    Gel    Patient Active Problem List   Diagnosis Date Noted   Primary osteoarthritis of left hip 09/27/2023   Cerumen impaction 09/01/2023   Bilateral hearing loss 09/01/2023   Chronic hyponatremia 06/22/2023   Anemia 06/22/2023   Mild intermittent asthma without complication 06/22/2023   Fatigue 06/22/2023   Environmental and seasonal allergies 06/22/2023   OA (osteoarthritis) of hip 01/17/2022   History of colon polyps 10/01/2021   Asymptomatic varicose veins of left lower extremity 10/01/2021   B12 deficiency 02/12/2020   Diverticulosis 01/15/2020   Pelvic congestion syndrome 01/15/2020   Osteoarthritis of right knee 07/03/2019   Age-related osteoporosis without current pathological fracture 02/01/2018   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   Seasonal allergic rhinitis    Vitamin D  deficiency    Hyperlipidemia, mixed 01/02/2008   Essential hypertension 01/02/2008    Past  Medical History:  Diagnosis Date   Allergy    Arthritis    Asthma    Cancer (HCC)    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   Cataract    Chronic kidney disease    CKD3   Ganglion, left wrist 05/11/2023   GERD (gastroesophageal reflux disease)    Hx: UTI (urinary tract infection)    Hyperlipidemia    Hypertension    Pain in right hip 09/16/2021   Pain in right knee 06/27/2022   Pain of left hand 04/26/2023   Pain of left hip joint  04/26/2023   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   Prediabetes    Seasonal allergic rhinitis    Vitamin D  deficiency     Past Surgical History:  Procedure Laterality Date   BACK SURGERY  1999   L5    DILATION AND CURETTAGE OF UTERUS     KNEE ARTHROPLASTY  2010   L knee   TONSILLECTOMY     TOTAL HIP ARTHROPLASTY Right 01/17/2022   Procedure: TOTAL HIP ARTHROPLASTY ANTERIOR APPROACH;  Surgeon: Melodi Lerner, MD;  Location: [facility];  Service: Orthopedics;  Laterality: Right;   TOTAL HIP ARTHROPLASTY Left 09/27/2023   Procedure: ARTHROPLASTY, HIP, TOTAL, ANTERIOR APPROACH;  Surgeon: Melodi Lerner, MD;  Location: [facility];  Service: Orthopedics;  Laterality: Left;   TOTAL SHOULDER ARTHROPLASTY  02/10/2011   Procedure: TOTAL SHOULDER ARTHROPLASTY;  Surgeon: Franky HERO Supple;  Location: [facility];  Service: Orthopedics;  Laterality: Left;  TOTAL SHOULDER ARTHROPLASTY LEFT SIDE   TOTAL SHOULDER ARTHROPLASTY  12/22/2011   Procedure: TOTAL SHOULDER ARTHROPLASTY;  Surgeon: Franky HERO Pointer, MD;  Location: [facility];  Service: Orthopedics;  Laterality: Right;  right total shoulder arthroplasty    Social History   Socioeconomic History   Marital status: Widowed    Spouse name: Not on file   Number of children: 2   Years of education: Not on file   Highest education level: Some college, no degree  Occupational History   Not on file  Tobacco Use   Smoking status: Former    Current packs/day: 0.00    Average packs/day: 0.3 packs/day for 20.0 years (5.0 ttl pk-yrs)    Types: Cigarettes    Start date: 12/16/1971    Quit date: 12/16/1991    Years since quitting: 32.1   Smokeless tobacco: Never  Vaping Use   Vaping status: Never Used  Substance and Sexual Activity   Alcohol  use: Yes    Alcohol /week: 8.0 standard drinks of alcohol     Types: 8 Glasses of wine per week    Comment: 6-8 glasses per week    Drug use: No   Sexual activity: Not Currently    Partners: Male    Birth  control/protection: Post-menopausal  Other Topics Concern   Not on file  Social History Narrative   Not on file   Social Drivers of Health   Financial Resource Strain: Low Risk  (09/20/2023)   Overall Financial Resource Strain (CARDIA)    Difficulty of Paying Living Expenses: Not hard at all  Food Insecurity: No Food Insecurity (09/28/2023)   Hunger Vital Sign    Worried About Running Out of Food in the Last Year: Never true    Ran Out of Food in the Last Year: Never true  Transportation Needs: No Transportation Needs (09/28/2023)   PRAPARE - Transportation    Lack of Transportation (Medical): No    Lack of  Transportation (Non-Medical): No  Physical Activity: Inactive (09/20/2023)   Exercise Vital Sign    Days of Exercise per Week: 0 days    Minutes of Exercise per Session: 0 min  Stress: No Stress Concern Present (09/20/2023)   [employer] of Occupational Health - Occupational Stress Questionnaire    Feeling of Stress: Not at all  Social Connections: Moderately Integrated (09/28/2023)   Social Connection and Isolation Panel    Frequency of Communication with Friends and Family: More than three times a week    Frequency of Social Gatherings with Friends and Family: More than three times a week    Attends Religious Services: More than 4 times per year    Active Member of [employer] or Organizations: Yes    Attends Banker Meetings: More than 4 times per year    Marital Status: Widowed  Intimate Partner Violence: Not At Risk (09/28/2023)   Humiliation, Afraid, Rape, and Kick questionnaire    Fear of Current or Ex-Partner: No    Emotionally Abused: No    Physically Abused: No    Sexually Abused: No    Family History  Problem Relation Age of Onset   Breast cancer Mother 82   Hypertension Father    Heart disease Brother    Esophageal cancer Brother    Heart disease Maternal Grandmother    Heart disease Maternal Grandfather    Breast cancer Paternal Grandmother     Diabetes Paternal Grandfather    Colon cancer Neg Hx    Rectal cancer Neg Hx    Stomach cancer Neg Hx      Review of Systems  Constitutional:  Positive for fever.  HENT:  Positive for congestion and sore throat.   Respiratory:  Positive for cough and sputum production. Negative for shortness of breath.   Cardiovascular:  Negative for chest pain and palpitations.  Gastrointestinal:  Negative for abdominal pain, diarrhea, nausea and vomiting.  Genitourinary: Negative.  Negative for dysuria and hematuria.  Neurological: Negative.  Negative for dizziness and headaches.  All other systems reviewed and are negative.   Vitals:   02/13/24 1258  BP: (!) 140/80  Pulse: 69  Temp: 97.6 F (36.4 C)  SpO2: 99%    Physical Exam Vitals reviewed.  Constitutional:      Appearance: Normal appearance.  HENT:     Head: Normocephalic.     Mouth/Throat:     Mouth: Mucous membranes are moist.     Pharynx: Oropharynx is clear.  Eyes:     Extraocular Movements: Extraocular movements intact.     Pupils: Pupils are equal, round, and reactive to light.  Cardiovascular:     Rate and Rhythm: Normal rate and regular rhythm.     Pulses: Normal pulses.     Heart sounds: Normal heart sounds.  Pulmonary:     Effort: Pulmonary effort is normal.     Breath sounds: Normal breath sounds.  Musculoskeletal:     Cervical back: No tenderness.  Lymphadenopathy:     Cervical: No cervical adenopathy.  Skin:    General: Skin is warm and dry.     Capillary Refill: Capillary refill takes less than 2 seconds.  Neurological:     General: No focal deficit present.     Mental Status: She is alert and oriented to person, place, and time.  Psychiatric:        Mood and Affect: Mood normal.        Behavior: Behavior normal.  ASSESSMENT & PLAN: Problem List Items Addressed This Visit       Respiratory   Lower respiratory infection - Primary   Clinically stable.  No red flag signs or symptoms. No  clinical findings of pneumonia. Upper respiratory viral infection now with secondary bacterial infection. Recommend Augmentin 875 mg twice a day for 7 days Advised to rest and stay well-hydrated Symptom management discussed Advised to contact the office if no better or worse during the next several days. Recommend follow-up with PCP.      Relevant Medications   amoxicillin-clavulanate (AUGMENTIN) 875-125 MG tablet     Other   Acute cough   Cough management discussed Recommend over-the-counter Mucinex DM twice a day along with cough drops Advised to rest and stay well-hydrated May take NyQuil at bedtime Advised to contact the office if no better or worse during the next several days.      Relevant Medications   dextromethorphan-guaiFENesin (MUCINEX DM) 30-600 MG 12hr tablet   Patient Instructions  Acute Bronchitis, Adult  Acute bronchitis is when air tubes in the lungs (bronchi) suddenly get swollen. The condition can make it hard for you to breathe. In adults, acute bronchitis usually goes away within 2 weeks. A cough caused by bronchitis may last up to 3 weeks. Smoking, allergies, and asthma can make the condition worse. What are the causes? Germs that cause cold and flu (viruses). The most common cause of this condition is the virus that causes the common cold. Bacteria. Substances that bother (irritate) the lungs, including: Smoke from cigarettes and other types of tobacco. Dust and pollen. Fumes from chemicals, gases, or burned fuel. Indoor or outdoor air pollution. What increases the risk? A weak body's defense system. This is also called the immune system. Any condition that affects your lungs and breathing, such as asthma. What are the signs or symptoms? A cough. Coughing up clear, yellow, or green mucus. Making high-pitched whistling sounds when you breathe, most often when you breathe out (wheezing). Runny or stuffy nose. Having too much mucus in your lungs (chest  congestion). Shortness of breath. Body aches. A sore throat. How is this treated? Acute bronchitis may go away over time without treatment. Your doctor may tell you to: Drink more fluids. This will help thin your mucus so it is easier to cough up. Use a device that gets medicine into your lungs (inhaler). Use a vaporizer or a humidifier. These are machines that add water  to the air. This helps with coughing and poor breathing. Take a medicine that thins mucus and helps clear it from your lungs. Take a medicine that prevents or stops coughing. It is not common to take an antibiotic medicine for this condition. Follow these instructions at home:  Take over-the-counter and prescription medicines only as told by your doctor. Use an inhaler, vaporizer, or humidifier as told by your doctor. Take two teaspoons (10 mL) of honey at bedtime. This helps lessen your coughing at night. Drink enough fluid to keep your pee (urine) pale yellow. Do not smoke or use any products that contain nicotine or tobacco. If you need help quitting, ask your doctor. Get a lot of rest. Return to your normal activities when your doctor says that it is safe. Keep all follow-up visits. How is this prevented?  Wash your hands often with soap and water  for at least 20 seconds. If you cannot use soap and water , use hand sanitizer. Avoid contact with people who have cold symptoms. Try not to  touch your mouth, nose, or eyes with your hands. Avoid breathing in smoke or chemical fumes. Make sure to get the flu shot every year. Contact a doctor if: Your symptoms do not get better in 2 weeks. You have trouble coughing up the mucus. Your cough keeps you awake at night. You have a fever. Get help right away if: You cough up blood. You have chest pain. You have very bad shortness of breath. You faint or keep feeling like you are going to faint. You have a very bad headache. Your fever or chills get worse. These symptoms  may be an emergency. Get help right away. Call your local emergency services (911 in the [place] Do not wait to see if the symptoms will go away. Do not drive yourself to the hospital. Summary Acute bronchitis is when air tubes in the lungs (bronchi) suddenly get swollen. In adults, acute bronchitis usually goes away within 2 weeks. Drink more fluids. This will help thin your mucus so it is easier to cough up. Take over-the-counter and prescription medicines only as told by your doctor. Contact a doctor if your symptoms do not improve after 2 weeks of treatment. This information is not intended to replace advice given to you by your health care provider. Make sure you discuss any questions you have with your health care provider. Document Revised: 07/15/2020 Document Reviewed: 07/15/2020 Elsevier Patient Education  2024 [employer]    Emil Schaumann, MD [facility]

## 2024-03-26 ENCOUNTER — Other Ambulatory Visit: Payer: Self-pay | Admitting: Family Medicine

## 2024-03-26 DIAGNOSIS — I10 Essential (primary) hypertension: Secondary | ICD-10-CM

## 2024-04-05 ENCOUNTER — Other Ambulatory Visit

## 2024-09-20 ENCOUNTER — Ambulatory Visit
